# Patient Record
Sex: Female | Born: 1970 | Race: Black or African American | Hispanic: No | Marital: Single | Smoking: Never smoker
Health system: Southern US, Community
[De-identification: ages and names within clinical notes are randomized; demographics above are authoritative.]

## PROBLEM LIST (undated history)

## (undated) DIAGNOSIS — Z923 Personal history of irradiation: Secondary | ICD-10-CM

## (undated) DIAGNOSIS — T7840XA Allergy, unspecified, initial encounter: Secondary | ICD-10-CM

## (undated) DIAGNOSIS — L309 Dermatitis, unspecified: Secondary | ICD-10-CM

## (undated) DIAGNOSIS — Z973 Presence of spectacles and contact lenses: Secondary | ICD-10-CM

## (undated) DIAGNOSIS — K420 Umbilical hernia with obstruction, without gangrene: Secondary | ICD-10-CM

## (undated) DIAGNOSIS — H409 Unspecified glaucoma: Secondary | ICD-10-CM

## (undated) DIAGNOSIS — J309 Allergic rhinitis, unspecified: Secondary | ICD-10-CM

## (undated) DIAGNOSIS — D649 Anemia, unspecified: Secondary | ICD-10-CM

## (undated) DIAGNOSIS — D509 Iron deficiency anemia, unspecified: Secondary | ICD-10-CM

## (undated) DIAGNOSIS — R011 Cardiac murmur, unspecified: Secondary | ICD-10-CM

## (undated) HISTORY — DX: Allergy, unspecified, initial encounter: T78.40XA

## (undated) HISTORY — DX: Iron deficiency anemia, unspecified: D50.9

## (undated) HISTORY — DX: Cardiac murmur, unspecified: R01.1

## (undated) HISTORY — DX: Umbilical hernia with obstruction, without gangrene: K42.0

## (undated) HISTORY — DX: Dermatitis, unspecified: L30.9

## (undated) HISTORY — DX: Allergic rhinitis, unspecified: J30.9

## (undated) HISTORY — DX: Anemia, unspecified: D64.9

## (undated) HISTORY — PX: HERNIA REPAIR: SHX51

## (undated) HISTORY — DX: Presence of spectacles and contact lenses: Z97.3

## (undated) HISTORY — DX: Unspecified glaucoma: H40.9

---

## 1999-07-07 ENCOUNTER — Emergency Department (HOSPITAL_COMMUNITY): Admission: EM | Admit: 1999-07-07 | Discharge: 1999-07-07 | Payer: Self-pay | Admitting: Emergency Medicine

## 1999-12-27 ENCOUNTER — Emergency Department (HOSPITAL_COMMUNITY): Admission: EM | Admit: 1999-12-27 | Discharge: 1999-12-27 | Payer: Self-pay

## 2000-01-07 ENCOUNTER — Emergency Department (HOSPITAL_COMMUNITY): Admission: EM | Admit: 2000-01-07 | Discharge: 2000-01-07 | Payer: Self-pay | Admitting: Emergency Medicine

## 2000-02-16 ENCOUNTER — Other Ambulatory Visit: Admission: RE | Admit: 2000-02-16 | Discharge: 2000-02-16 | Payer: Self-pay | Admitting: Obstetrics & Gynecology

## 2000-08-06 ENCOUNTER — Emergency Department (HOSPITAL_COMMUNITY): Admission: EM | Admit: 2000-08-06 | Discharge: 2000-08-06 | Payer: Self-pay | Admitting: Emergency Medicine

## 2000-09-19 ENCOUNTER — Emergency Department (HOSPITAL_COMMUNITY): Admission: EM | Admit: 2000-09-19 | Discharge: 2000-09-19 | Payer: Self-pay | Admitting: Emergency Medicine

## 2001-08-02 ENCOUNTER — Other Ambulatory Visit: Admission: RE | Admit: 2001-08-02 | Discharge: 2001-08-02 | Payer: Self-pay | Admitting: Obstetrics & Gynecology

## 2001-08-16 ENCOUNTER — Emergency Department (HOSPITAL_COMMUNITY): Admission: EM | Admit: 2001-08-16 | Discharge: 2001-08-16 | Payer: Self-pay | Admitting: *Deleted

## 2003-05-24 ENCOUNTER — Other Ambulatory Visit: Admission: RE | Admit: 2003-05-24 | Discharge: 2003-05-24 | Payer: Self-pay | Admitting: Obstetrics & Gynecology

## 2003-09-03 ENCOUNTER — Emergency Department (HOSPITAL_COMMUNITY): Admission: EM | Admit: 2003-09-03 | Discharge: 2003-09-04 | Payer: Self-pay | Admitting: Emergency Medicine

## 2004-07-13 ENCOUNTER — Other Ambulatory Visit: Admission: RE | Admit: 2004-07-13 | Discharge: 2004-07-13 | Payer: Self-pay | Admitting: Obstetrics & Gynecology

## 2005-08-26 ENCOUNTER — Other Ambulatory Visit: Admission: RE | Admit: 2005-08-26 | Discharge: 2005-08-26 | Payer: Self-pay | Admitting: Obstetrics & Gynecology

## 2005-08-27 ENCOUNTER — Emergency Department (HOSPITAL_COMMUNITY): Admission: EM | Admit: 2005-08-27 | Discharge: 2005-08-27 | Payer: Self-pay | Admitting: Emergency Medicine

## 2009-09-24 LAB — CONVERTED CEMR LAB: Pap Smear: NORMAL

## 2009-11-06 ENCOUNTER — Ambulatory Visit: Payer: Self-pay | Admitting: Internal Medicine

## 2009-11-06 DIAGNOSIS — J45909 Unspecified asthma, uncomplicated: Secondary | ICD-10-CM | POA: Insufficient documentation

## 2009-11-06 DIAGNOSIS — J309 Allergic rhinitis, unspecified: Secondary | ICD-10-CM

## 2009-11-06 HISTORY — DX: Allergic rhinitis, unspecified: J30.9

## 2009-11-07 LAB — CONVERTED CEMR LAB
ALT: 15 units/L (ref 0–35)
AST: 22 units/L (ref 0–37)
Albumin: 3.7 g/dL (ref 3.5–5.2)
Alkaline Phosphatase: 70 units/L (ref 39–117)
BUN: 4 mg/dL — ABNORMAL LOW (ref 6–23)
Basophils Absolute: 0 10*3/uL (ref 0.0–0.1)
Basophils Relative: 1.1 % (ref 0.0–3.0)
Bilirubin Urine: NEGATIVE
Bilirubin, Direct: 0.1 mg/dL (ref 0.0–0.3)
CO2: 25 meq/L (ref 19–32)
Calcium: 8.4 mg/dL (ref 8.4–10.5)
Chloride: 99 meq/L (ref 96–112)
Cholesterol: 205 mg/dL — ABNORMAL HIGH (ref 0–200)
Creatinine, Ser: 0.8 mg/dL (ref 0.4–1.2)
Direct LDL: 141.6 mg/dL
Eosinophils Absolute: 0.1 10*3/uL (ref 0.0–0.7)
Eosinophils Relative: 2.2 % (ref 0.0–5.0)
GFR calc non Af Amer: 103.37 mL/min (ref >60)
Glucose, Bld: 71 mg/dL (ref 70–99)
HCT: 37.4 % (ref 36.0–46.0)
HDL: 52.1 mg/dL (ref >39.00)
Hemoglobin: 11.8 g/dL — ABNORMAL LOW (ref 12.0–15.0)
Ketones, ur: NEGATIVE mg/dL
Leukocytes, UA: NEGATIVE
Lymphocytes Relative: 41.7 % (ref 12.0–46.0)
Lymphs Abs: 1.9 10*3/uL (ref 0.7–4.0)
MCHC: 31.6 g/dL (ref 30.0–36.0)
MCV: 73.6 fL — ABNORMAL LOW (ref 78.0–100.0)
Monocytes Absolute: 0.3 10*3/uL (ref 0.1–1.0)
Monocytes Relative: 6.6 % (ref 3.0–12.0)
Neutro Abs: 2.2 10*3/uL (ref 1.4–7.7)
Neutrophils Relative %: 48.4 % (ref 43.0–77.0)
Nitrite: NEGATIVE
Platelets: 290 10*3/uL (ref 150.0–400.0)
Potassium: 3.8 meq/L (ref 3.5–5.1)
RBC: 5.07 M/uL (ref 3.87–5.11)
RDW: 13.8 % (ref 11.5–14.6)
Sodium: 135 meq/L (ref 135–145)
Specific Gravity, Urine: 1.015 (ref 1.000–1.030)
TSH: 0.8 microintl units/mL (ref 0.35–5.50)
Total Bilirubin: 0.6 mg/dL (ref 0.3–1.2)
Total CHOL/HDL Ratio: 4
Total Protein, Urine: NEGATIVE mg/dL
Total Protein: 7.5 g/dL (ref 6.0–8.3)
Triglycerides: 119 mg/dL (ref 0.0–149.0)
Urine Glucose: NEGATIVE mg/dL
Urobilinogen, UA: 0.2 (ref 0.0–1.0)
VLDL: 23.8 mg/dL (ref 0.0–40.0)
WBC: 4.5 10*3/uL (ref 4.5–10.5)
pH: 6 (ref 5.0–8.0)

## 2009-11-14 ENCOUNTER — Encounter: Admission: RE | Admit: 2009-11-14 | Discharge: 2009-11-14 | Payer: Self-pay | Admitting: Internal Medicine

## 2009-11-14 LAB — HM MAMMOGRAPHY: HM Mammogram: NEGATIVE

## 2009-11-20 ENCOUNTER — Encounter (INDEPENDENT_AMBULATORY_CARE_PROVIDER_SITE_OTHER): Payer: Self-pay | Admitting: *Deleted

## 2009-11-20 ENCOUNTER — Ambulatory Visit: Payer: Self-pay | Admitting: Internal Medicine

## 2009-11-20 DIAGNOSIS — D509 Iron deficiency anemia, unspecified: Secondary | ICD-10-CM | POA: Insufficient documentation

## 2009-11-20 DIAGNOSIS — E785 Hyperlipidemia, unspecified: Secondary | ICD-10-CM | POA: Insufficient documentation

## 2009-11-20 HISTORY — DX: Iron deficiency anemia, unspecified: D50.9

## 2010-02-06 ENCOUNTER — Ambulatory Visit: Payer: Self-pay | Admitting: Internal Medicine

## 2010-02-06 LAB — CONVERTED CEMR LAB
Basophils Absolute: 0.1 10*3/uL (ref 0.0–0.1)
Basophils Relative: 0.8 % (ref 0.0–3.0)
Eosinophils Absolute: 0.2 10*3/uL (ref 0.0–0.7)
Eosinophils Relative: 2.9 % (ref 0.0–5.0)
HCT: 35 % — ABNORMAL LOW (ref 36.0–46.0)
Hemoglobin: 11.6 g/dL — ABNORMAL LOW (ref 12.0–15.0)
Lymphocytes Relative: 44.9 % (ref 12.0–46.0)
Lymphs Abs: 2.9 10*3/uL (ref 0.7–4.0)
MCHC: 33.3 g/dL (ref 30.0–36.0)
MCV: 71.6 fL — ABNORMAL LOW (ref 78.0–100.0)
Monocytes Absolute: 0.4 10*3/uL (ref 0.1–1.0)
Monocytes Relative: 5.9 % (ref 3.0–12.0)
Neutro Abs: 2.9 10*3/uL (ref 1.4–7.7)
Neutrophils Relative %: 45.5 % (ref 43.0–77.0)
Platelets: 299 10*3/uL (ref 150.0–400.0)
RBC: 4.89 M/uL (ref 3.87–5.11)
RDW: 15.3 % — ABNORMAL HIGH (ref 11.5–14.6)
WBC: 6.5 10*3/uL (ref 4.5–10.5)

## 2010-11-24 NOTE — Assessment & Plan Note (Signed)
Summary: NEW [employer] PT-PKG--STC   Vital Signs:  Patient profile:   39 year old female Height:      62 inches Weight:      187 pounds BMI:     34.33 O2 Sat:      98 % on Room air Temp:     98.7 degrees F oral Pulse rate:   70 / minute BP sitting:   104 / 70  (left arm) Cuff size:   regular  Vitals Entered ByZella Ball Ewing (November 06, 2009 9:43 AM)  O2 Flow:  Room air  Preventive Care Screening  Pap Smear:    Date:  09/24/2009    Results:  normal   Last Tetanus Booster:    Date:  10/25/2000    Results:  Historical   CC: New patient/RE   CC:  New patient/RE.  History of Present Illness: overall doing well, no complaints.  Pt denies CP, sob, doe, wheezing, orthopnea, pnd, worsening LE edema, palps, dizziness or syncope  Pt denies new neuro symptoms such as headache, facial or extremity weakness   Preventive Screening-Counseling & Management  Alcohol-Tobacco     Smoking Status: never  Problems Prior to Update: 1)  Preventive Health Care  (ICD-V70.0) 2)  Asthma  (ICD-493.90) 3)  Allergic Rhinitis  (ICD-477.9)  Medications Prior to Update: 1)  None  Current Medications (verified): 1)  Claritin 10 Mg Tabs (Loratadine) .Marland Kitchen.. 1 By Mouth Once Daily 2)  Daily Multi  Tabs (Multiple Vitamins-Minerals) .Marland Kitchen.. 1 By Mouth Once Daily 3)  Necon 10/11 (28) 35 Mcg Tabs (Norethin-Eth Estrad Biphasic) .Marland Kitchen.. 1 By Mouth Once Daily 4)  Allergy Shots Weekly 5)  Advair Diskus 100-50 Mcg/dose Aepb (Fluticasone-Salmeterol) .Marland Kitchen.. 1 By Mouth Two Times A Day As Needed 6)  Proair Hfa 108 (90 Base) Mcg/act Aers (Albuterol Sulfate) .... 2 Puffs Qid As Needed  Allergies (verified): 1)  ! 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 mother with HTN father with HTN and DM  Social History: Reviewed history and no changes required. 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         all otherwise negative per pt  Physical Exam  General:  alert and overweight-appearing.   Head:  normocephalic and atraumatic.   Eyes:  vision grossly intact, pupils equal, and pupils round.   Ears:  R ear normal and L ear normal.   Nose:  no external deformity and no nasal discharge.   Mouth:  no gingival abnormalities and pharynx pink and moist.   Neck:  supple and no masses.   Lungs:  normal respiratory effort and normal breath sounds.   Heart:  normal rate and regular rhythm.   Abdomen:  soft, non-tender, and  normal bowel sounds.   Msk:  no joint tenderness and no joint swelling.   Extremities:  no edema, no erythema  Neurologic:  cranial nerves II-XII intact and strength normal in all extremities.     Impression & Recommendations:  Problem # 1:  Preventive Health Care (ICD-V70.0)  Overall doing well, updated the age appropriate counseling and education;  routine health screening/prevention reviewed and done as appropriate unless declined, immunizations up to date or declined, diet counseling done if overweight, urged to quit  smoking if smokes , labs ordered  Orders: TLB-BMP (Basic Metabolic Panel-BMET) (80048-METABOL) TLB-CBC Platelet - w/Differential (85025-CBCD) TLB-Hepatic/Liver Function Pnl (80076-HEPATIC) TLB-Lipid Panel (80061-LIPID) TLB-TSH (Thyroid Stimulating Hormone) (84443-TSH) TLB-Udip ONLY (81003-UDIP)  Complete Medication List: 1)  Claritin 10 Mg Tabs (Loratadine) .Marland Kitchen.. 1 by mouth once daily 2)  Daily Multi Tabs (Multiple vitamins-minerals) .Marland Kitchen.. 1 by mouth once daily 3)  Necon 10/11 (28) 35 Mcg Tabs (Norethin-eth estrad biphasic) .Marland Kitchen.. 1 by mouth once daily 4)  Allergy Shots Weekly  5)  Advair Diskus 100-50 Mcg/dose Aepb (Fluticasone-salmeterol) .Marland Kitchen.. 1 by mouth two times a day as needed 6)  Proair Hfa 108 (90 Base) Mcg/act Aers (Albuterol sulfate) .... 2 puffs qid as needed  Patient Instructions: 1)  Please call  Imaging on [address]; or call [facility] on [address] for the baseline mammogram 2)  Continue all previous medications as before this visit  3)  Please go to the Lab in the basement for your blood and/or urine tests today 4)  Please schedule a follow-up appointment in 1 year or sooner if needed   Immunization History:  Tetanus/Td Immunization History:    Tetanus/Td:  historical (10/25/2000)

## 2010-11-24 NOTE — Letter (Signed)
Summary: Out of Work  [facility]  [address] [place], [place] [address]   Phone: [phone]  Fax: [phone]    November 20, 2009   Employee:  Sue Beasley    To Whom It May Concern:   For Medical reasons, please excuse the above named employee from work for the following dates:  Start:   11/20/2009  End:   11/21/2009  If you need additional information, please feel free to contact our office.         Sincerely,    Dr. Oliver Barre

## 2010-11-24 NOTE — Assessment & Plan Note (Signed)
Summary: FEVER 101 /NWS SIDE DOOR   Vital Signs:  Patient profile:   40 year old female Height:      62 inches Weight:      183 pounds BMI:     33.59 O2 Sat:      98 % on Room air Temp:     102.4 degrees F oral Pulse rate:   93 / minute BP sitting:   110 / 70  (left arm) Cuff size:   regular  Vitals Entered ByZella Ball Ewing (November 20, 2009 2:02 PM)  O2 Flow:  Room air CC: fever,nasal congestion,cough,chills/RE   CC:  fever, nasal congestion, cough, and chills/RE.  History of Present Illness: here with acute onset rather high fever, ST, facial congestion , prod cough greenish sputum, but Pt denies CP, sob, doe, wheezing, orthopnea, pnd, worsening LE edema, palps, dizziness or syncope , or chills.  Pt denies new neuro symptoms such as headache, facial or extremity weakness   No obvoius overt bleeding but continues to have significant heavier periods thought related to fibroids over the past few months, and has plan to f/u with GYN in april.    Problems Prior to Update: 1)  Preventive Health Care  (ICD-V70.0) 2)  Asthma  (ICD-493.90) 3)  Allergic Rhinitis  (ICD-477.9)  Medications Prior to Update: 1)  Claritin 10 Mg Tabs (Loratadine) .Marland Kitchen.. 1 By Mouth Once Daily 2)  Daily Multi  Tabs (Multiple Vitamins-Minerals) .Marland Kitchen.. 1 By Mouth Once Daily 3)  Necon 10/11 (28) 35 Mcg Tabs (Norethin-Eth Estrad Biphasic) .Marland Kitchen.. 1 By Mouth Once Daily 4)  Allergy Shots Weekly 5)  Advair Diskus 100-50 Mcg/dose Aepb (Fluticasone-Salmeterol) .Marland Kitchen.. 1 By Mouth Two Times A Day As Needed 6)  Proair Hfa 108 (90 Base) Mcg/act Aers (Albuterol Sulfate) .... 2 Puffs Qid As Needed  Current Medications (verified): 1)  Claritin 10 Mg Tabs (Loratadine) .Marland Kitchen.. 1 By Mouth Once Daily 2)  Daily Multi  Tabs (Multiple Vitamins-Minerals) .Marland Kitchen.. 1 By Mouth Once Daily 3)  Necon 10/11 (28) 35 Mcg Tabs (Norethin-Eth Estrad Biphasic) .Marland Kitchen.. 1 By Mouth Once Daily 4)  Allergy Shots Weekly 5)  Advair Diskus 100-50 Mcg/dose Aepb  (Fluticasone-Salmeterol) .Marland Kitchen.. 1 By Mouth Two Times A Day As Needed 6)  Proair Hfa 108 (90 Base) Mcg/act Aers (Albuterol Sulfate) .... 2 Puffs Qid As Needed 7)  Ferrous Sulfate 325 (65 Fe) Mg Tabs (Ferrous Sulfate) .Marland Kitchen.. 1 By Mouth Once Daily 8)  Clarithromycin 500 Mg Tabs (Clarithromycin) .Marland Kitchen.. 1 By Mouth Two Times A Day 9)  Hydrocodone-Homatropine 5-1.5 Mg/58ml Syrp (Hydrocodone-Homatropine) .Marland Kitchen.. 1 Tsp By Mouth Q 6 Hrs As Needed Cough  Allergies (verified): 1)  ! * Chicken/egg Allergy 2)  ! 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  , mild ill alert.   Head:  normocephalic and atraumatic.  normocephalic and atraumatic.   Eyes:  vision grossly intact, pupils equal, and pupils round.  vision grossly intact, pupils equal, and pupils round.   Ears:  bilat tm's red, sinus nontender Nose:  nasal dischargemucosal pallor and mucosal erythema.  nasal dischargemucosal pallor and mucosal erythema.  Mouth:  pharyngeal erythema, fair dentition, and pharyngeal exudate.  pharyngeal erythema, fair dentition, and pharyngeal exudate.   Neck:  supple and cervical lymphadenopathy.  supple and cervical lymphadenopathy.   Lungs:  normal respiratory effort and normal breath sounds.  normal respiratory effort and normal breath sounds.   Heart:  normal rate and regular rhythm.  normal rate and regular rhythm.   Extremities:  no edema, no erythema    Impression & Recommendations:  Problem # 1:  BRONCHITIS-ACUTE (ICD-466.0)  Her updated medication list for this problem  includes:    Advair Diskus 100-50 Mcg/dose Aepb (Fluticasone-salmeterol) .Marland Kitchen... 1 by mouth two times a day as needed    Proair Hfa 108 (90 Base) Mcg/act Aers (Albuterol sulfate) .Marland Kitchen... 2 puffs qid as needed    Clarithromycin 500 Mg Tabs (Clarithromycin) .Marland Kitchen... 1 by mouth two times a day    Hydrocodone-homatropine 5-1.5 Mg/40ml Syrp (Hydrocodone-homatropine) .Marland Kitchen... 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 1 by mouth once daily treat as above, f/u any worsening signs or symptoms   Problem # 3:  HYPERLIPIDEMIA (ICD-272.4)  Labs Reviewed: SGOT: 22 (11/06/2009)   SGPT: 15 (11/06/2009)   HDL:52.10 (11/06/2009)  Chol:205 (11/06/2009)  Trig:119.0 (11/06/2009) d/w pt - goal ldl less than 100, declines counseling at this time, to follow lower chol diet at home  Problem # 4:  ASTHMA (ICD-493.90)  Her updated medication list for this problem includes:    Advair Diskus 100-50 Mcg/dose Aepb (Fluticasone-salmeterol) .Marland Kitchen... 1 by mouth two times a day as needed    Proair Hfa 108 (90 Base) Mcg/act Aers (Albuterol sulfate) .Marland Kitchen... 2 puffs qid as needed o/w stable  - cont meds as is  Complete Medication List: 1)  Claritin 10 Mg Tabs (Loratadine) .Marland Kitchen.. 1 by mouth once daily 2)  Daily Multi Tabs (Multiple vitamins-minerals) .Marland Kitchen.. 1 by mouth once daily 3)  Necon 10/11 (28) 35 Mcg Tabs (Norethin-eth estrad biphasic) .Marland Kitchen.. 1 by mouth once daily 4)  Allergy Shots Weekly  5)  Advair Diskus 100-50 Mcg/dose Aepb (Fluticasone-salmeterol) .Marland Kitchen.. 1 by mouth two times a day as needed 6)  Proair Hfa 108 (90 Base) Mcg/act Aers (Albuterol sulfate) .... 2 puffs qid as needed 7)  Ferrous Sulfate 325 (65 Fe) Mg Tabs (Ferrous sulfate) .Marland Kitchen.. 1 by mouth once daily 8)  Clarithromycin 500 Mg Tabs (Clarithromycin) .Marland Kitchen.. 1 by  mouth two times a day 9)  Hydrocodone-homatropine 5-1.5 Mg/84ml Syrp (Hydrocodone-homatropine) .Marland Kitchen.. 1 tsp by mouth q 6 hrs as needed cough  Patient Instructions: 1)  Please take all new medications as prescribed 2)  Continue all previous medications as before this visit  3)  you are given the note off work 4)  please keep your appt with GYN in April to followup on the anemia 5)  please take iron sulfate 325 mg -1 per day 6)  Please schedule a follow-up appointment as needed. Prescriptions: HYDROCODONE-HOMATROPINE 5-1.5 MG/5ML SYRP (HYDROCODONE-HOMATROPINE) 1 tsp by mouth q 6 hrs as needed cough  #6 oz x 1   Entered and Authorized by:   [name] MD   Signed by:   [name] MD on 11/20/2009   Method used:   Print then Give to Patient   RxID:   [id number] CLARITHROMYCIN  500 MG TABS (CLARITHROMYCIN) 1 by mouth two times a day  #20 x 0   Entered and Authorized by:   [name] MD   Signed by:   [name] MD on 11/20/2009   Method used:   Print then Give to Patient   RxID:   [id number]

## 2011-03-01 ENCOUNTER — Ambulatory Visit (INDEPENDENT_AMBULATORY_CARE_PROVIDER_SITE_OTHER): Payer: BC Managed Care – PPO | Admitting: Internal Medicine

## 2011-03-01 ENCOUNTER — Encounter: Payer: Self-pay | Admitting: Internal Medicine

## 2011-03-01 VITALS — BP 102/70 | HR 58 | Temp 98.5°F | Ht 61.0 in | Wt 190.2 lb

## 2011-03-01 DIAGNOSIS — Z0001 Encounter for general adult medical examination with abnormal findings: Secondary | ICD-10-CM | POA: Insufficient documentation

## 2011-03-01 DIAGNOSIS — Z0000 Encounter for general adult medical examination without abnormal findings: Secondary | ICD-10-CM | POA: Insufficient documentation

## 2011-03-01 DIAGNOSIS — K420 Umbilical hernia with obstruction, without gangrene: Secondary | ICD-10-CM

## 2011-03-01 HISTORY — DX: Umbilical hernia with obstruction, without gangrene: K42.0

## 2011-03-01 NOTE — Patient Instructions (Signed)
Continue all other medications as before Please see the [facility] today regarding the surgical referral Please return in 6 mo with Lab testing done 3-5 days before

## 2011-03-01 NOTE — Progress Notes (Signed)
  Subjective:    Patient ID: Sue Beasley, female    DOB: [birth year], 40 y.o.   MRN: [id number]  HPI  Here for new problem, acute onset pain x 4 days, mild to mod at the umbilicus but just not getting better;  Has a swollen area new for her after doing lifting wt excercises;  No rash or drainage, fever, n/v, bowel change or blood.   Denies urinary symptoms such as dysuria, frequency, urgency,or hematuria.  No prior hx of umbilical hernia and has never heard of this.   No past medical history on file. No past surgical history on file.  reports that she has never smoked. She does not have any smokeless tobacco history on file. She reports that she does not drink alcohol. Her drug history not on file. family history is not on file. Allergies  Allergen Reactions  . BJY:NWGNFAOZHYQ+MVHQIONGE+XBMWUXLKGM Acid+Aspartame     REACTION: rash   No current outpatient prescriptions on file prior to visit.   Review of Systems All otherwise neg per pt     Objective:   Physical Exam BP 102/70  Pulse 58  Temp(Src) 98.5 F (36.9 C) (Oral)  Ht 5\' 1"  (1.549 m)  Wt 190 lb 4 oz (86.297 kg)  BMI 35.95 kg/m2  SpO2 99%  LMP 02/12/2011 Physical Exam  VS noted Constitutional: Pt appears well-developed and well-nourished.  HENT: Head: Normocephalic.  Right Ear: External ear normal.  Left Ear: External ear normal.  Eyes: Conjunctivae and EOM are normal. Pupils are equal, round, and reactive to light.  Neck: Normal range of motion. Neck supple.  Cardiovascular: Normal rate and regular rhythm.   Pulmonary/Chest: Effort normal and breath sounds normal.  Abd:  Soft, NT, non-distended, + BS except for small but mod tender non reducible mass at the right umbilicus, prob hernia , non fluctuance, no drainage Neurological: Pt is alert. No cranial nerve deficit.  Skin: Skin is warm. No erythema.  Psychiatric: Pt behavior is normal. Thought content normal.         Assessment & Plan:

## 2011-03-01 NOTE — Assessment & Plan Note (Signed)
Small but at least mod tender, and unable to readily reduce,  Should be seen per surgury soon; will refer

## 2011-03-03 ENCOUNTER — Encounter (INDEPENDENT_AMBULATORY_CARE_PROVIDER_SITE_OTHER): Payer: Self-pay | Admitting: General Surgery

## 2011-03-12 ENCOUNTER — Encounter: Payer: Self-pay | Admitting: Internal Medicine

## 2011-05-07 ENCOUNTER — Other Ambulatory Visit (INDEPENDENT_AMBULATORY_CARE_PROVIDER_SITE_OTHER): Payer: Self-pay | Admitting: General Surgery

## 2011-05-07 ENCOUNTER — Encounter (HOSPITAL_COMMUNITY): Payer: BC Managed Care – PPO

## 2011-05-07 LAB — CBC
HCT: 36.3 % (ref 36.0–46.0)
Hemoglobin: 11.3 g/dL — ABNORMAL LOW (ref 12.0–15.0)
MCH: 20.7 pg — ABNORMAL LOW (ref 26.0–34.0)
MCHC: 31.1 g/dL (ref 30.0–36.0)
MCV: 66.5 fL — ABNORMAL LOW (ref 78.0–100.0)
Platelets: 324 10*3/uL (ref 150–400)
RBC: 5.46 MIL/uL — ABNORMAL HIGH (ref 3.87–5.11)
RDW: 17.3 % — ABNORMAL HIGH (ref 11.5–15.5)
WBC: 5.8 10*3/uL (ref 4.0–10.5)

## 2011-05-07 LAB — URINALYSIS, ROUTINE W REFLEX MICROSCOPIC
Bilirubin Urine: NEGATIVE
Glucose, UA: NEGATIVE mg/dL
Ketones, ur: NEGATIVE mg/dL
Leukocytes, UA: NEGATIVE
Nitrite: NEGATIVE
Protein, ur: NEGATIVE mg/dL
Specific Gravity, Urine: 1.015 (ref 1.005–1.030)
Urobilinogen, UA: 0.2 mg/dL (ref 0.0–1.0)
pH: 5 (ref 5.0–8.0)

## 2011-05-07 LAB — HCG, SERUM, QUALITATIVE: Preg, Serum: NEGATIVE

## 2011-05-07 LAB — URINE MICROSCOPIC-ADD ON

## 2011-05-07 LAB — SURGICAL PCR SCREEN
MRSA, PCR: NEGATIVE
Staphylococcus aureus: NEGATIVE

## 2011-05-10 ENCOUNTER — Telehealth (INDEPENDENT_AMBULATORY_CARE_PROVIDER_SITE_OTHER): Payer: Self-pay | Admitting: General Surgery

## 2011-05-10 NOTE — Telephone Encounter (Signed)
Message copied by Liliana Cline on Mon May 10, 2011  1:58 PM ------      Message from: Andrey Campanile, ERIC M      Created: Mon May 10, 2011 12:29 PM       Labs ok for surgery

## 2011-05-10 NOTE — Telephone Encounter (Signed)
Labs ok for surgery faxed to [facility] per Dr Andrey Campanile. [phone].

## 2011-05-13 ENCOUNTER — Ambulatory Visit (HOSPITAL_COMMUNITY)
Admission: RE | Admit: 2011-05-13 | Discharge: 2011-05-13 | Disposition: A | Discharge status: Home / Self Care | Payer: BC Managed Care – PPO | Source: Ambulatory Visit | Attending: General Surgery | Admitting: General Surgery

## 2011-05-13 DIAGNOSIS — Z01812 Encounter for preprocedural laboratory examination: Secondary | ICD-10-CM | POA: Insufficient documentation

## 2011-05-13 DIAGNOSIS — J45909 Unspecified asthma, uncomplicated: Secondary | ICD-10-CM | POA: Insufficient documentation

## 2011-05-13 DIAGNOSIS — E669 Obesity, unspecified: Secondary | ICD-10-CM | POA: Insufficient documentation

## 2011-05-13 DIAGNOSIS — K429 Umbilical hernia without obstruction or gangrene: Secondary | ICD-10-CM

## 2011-05-13 DIAGNOSIS — Z01810 Encounter for preprocedural cardiovascular examination: Secondary | ICD-10-CM | POA: Insufficient documentation

## 2011-05-13 HISTORY — PX: UMBILICAL HERNIA REPAIR: SHX196

## 2011-05-14 ENCOUNTER — Telehealth (INDEPENDENT_AMBULATORY_CARE_PROVIDER_SITE_OTHER): Payer: Self-pay | Admitting: General Surgery

## 2011-05-14 NOTE — Telephone Encounter (Signed)
Left message for patient to call back and make post op appt

## 2011-05-28 NOTE — Op Note (Signed)
NAME:  Sporn, Aruna                  ACCOUNT NO.:  [id number]  MEDICAL RECORD NO.:  [id number]  LOCATION:  DAYL                         FACILITY:  [facility]  PHYSICIAN:  Mary Sella. Andrey Campanile, MD     DATE OF BIRTH:  13-Jul-1971  DATE OF PROCEDURE:  05/13/2011 DATE OF DISCHARGE:                              OPERATIVE REPORT   PREOPERATIVE DIAGNOSIS:  Umbilical hernia.  POSTOPERATIVE DIAGNOSIS:  Umbilical hernia.  PROCEDURE PERFORMED:  Open primary repair of umbilical hernia.  SURGEON:  Mary Sella. Andrey Campanile, MD.  ANESTHESIA:  General plus 30 mL Exparel.  FINDINGS:  Fascial defect less than 1 cm in size.  ESTIMATED BLOOD LOSS:  Minimal.  INDICATIONS FOR PROCEDURE:  The patient is a 40 year old overweight African-American female who developed umbilical pain in May.  She presented to my office and she was found to have a small umbilical hernia.  We discussed nonoperative and operative management.  The patient elected to have operative repair.  Now, she want to wait until July, she was on school break.  We discussed the risks and benefits of surgery including but limited to bleeding, infection, injury to surround structures, scarring, hematoma formation, DVT recurrence, and hernia recurrence.  We also discussed the typical postop recovery course.  She elected to proceed with the surgery.  DESCRIPTION OF PROCEDURE:  After obtaining informed consent, the patient was brought to the operating room and placed supine on the operating table.  General endotracheal anesthesia was established.  Sequential compression devices were placed.  She received vancomycin prior to skin incision.  Her abdomen was prepped and draped in usual standard surgical fashion with ChloraPrep.  Surgical time-out was performed.  A transverse infraumbilical incision was made of approximately 5 cm. The subcutaneous tissue was divided by electrocautery.  Then, using a kelley, I tunneled up and around the umbilicus, lifted the  umbilicus off the fascia with electrocautery.  Thus exposing the fascial defect.  The fascial defect was around 0.8 cm.  I lifted the adipose tissue up off the fascia   around the defect in a circumferential manner for about 3 cm.  There is little bit of bleeding from the fascial edges.  This was controlled with hemostasis with electrocautery.  I then placed 4 interrupted #1 Novafil sutures to close defect transversely.  The subcutaneous tissue was irrigated.  Hemostasis was achieved.  I then injected some Exparel which had been diluted up to 30 mL in the fascia and the subcutaneous tissue and the deep dermis. The base of the umbilicus was tacked back down to the fascia with 2-0 Vicryl sutures.  The deep dermis was reapproximated with inverted interrupted 3-0 Vicryl and the skin was reapproximated with running 4-0 Monocryl followed by the application and Steri-Strips, 4x4, and an offsite.  The patient was extubated and taken to the recovery room in stable condition.  There were no immediate complications.  The patient tolerated the procedure well.  All needle and instruments counts were correct x3.     Mary Sella. Andrey Campanile, MD     EMW/MEDQ  D:  05/13/2011  T:  05/13/2011  Job:  [id number]  cc:   [name],  MD [address] [place] [address]  Electronically Signed by Gaynelle Adu M.D. on 05/28/2011 01:19:03 PM

## 2011-06-01 ENCOUNTER — Encounter (INDEPENDENT_AMBULATORY_CARE_PROVIDER_SITE_OTHER): Payer: Self-pay | Admitting: General Surgery

## 2011-06-02 ENCOUNTER — Encounter (INDEPENDENT_AMBULATORY_CARE_PROVIDER_SITE_OTHER): Payer: Self-pay | Admitting: General Surgery

## 2011-06-02 ENCOUNTER — Ambulatory Visit (INDEPENDENT_AMBULATORY_CARE_PROVIDER_SITE_OTHER): Payer: BC Managed Care – PPO | Admitting: General Surgery

## 2011-06-02 DIAGNOSIS — Z09 Encounter for follow-up examination after completed treatment for conditions other than malignant neoplasm: Secondary | ICD-10-CM

## 2011-06-02 NOTE — Progress Notes (Signed)
CC: postop  Procedure: Open primary umbilical hernia repair May 13, 2011  History of Present Ilness: 40 year old African-American female comes in today for her first postoperative visit. She states that she did quite well after surgery. She did not take any pain pills after surgery. She took some Advil. She denies any fevers or chills. She denies any nausea or vomiting. She denies any diarrhea or constipation. She denies any redness or drainage from her incision. She has had some itching around her incision. She states that it felt like she had done a bunch of crunches immediately after surgery.  Physical Exam: Well-developed well-nourished African-American female no apparent distress Pulmonary-clear to auscultation Cardiac-regular rate and rhythm Abdomen-soft, nontender, nondistended. Well-healed transverse infraumbilical incision. No cellulitis or induration. No hematoma or seroma. No signs of hernia recurrence. Extremities-no edema    data reviewed: I reviewed my operative note from July 19. She had a very small fascial defect approximately 0.7 cm  Assessment and Plan: Status post open primary repair of umbilical hernia  She appears to be doing quite well. I advised her to not lifting greater than 15 pounds until after August 30. She has already returned to work. We discussed the operative findings.  I will see her in 6 weeks.

## 2011-06-02 NOTE — Patient Instructions (Signed)
Do not lift anything greater than 15 pounds until after 8/30.  You may resume full activities after 8/30.

## 2011-07-16 ENCOUNTER — Ambulatory Visit (INDEPENDENT_AMBULATORY_CARE_PROVIDER_SITE_OTHER): Payer: BC Managed Care – PPO | Admitting: General Surgery

## 2011-07-16 ENCOUNTER — Encounter (INDEPENDENT_AMBULATORY_CARE_PROVIDER_SITE_OTHER): Payer: Self-pay | Admitting: General Surgery

## 2011-07-16 VITALS — BP 122/82 | HR 60 | Temp 98.0°F | Resp 20 | Ht 61.5 in | Wt 196.0 lb

## 2011-07-16 DIAGNOSIS — Z09 Encounter for follow-up examination after completed treatment for conditions other than malignant neoplasm: Secondary | ICD-10-CM

## 2011-07-16 NOTE — Progress Notes (Signed)
CC: postop  Procedure: Open primary umbilical hernia repair May 13, 2011  History of Present Ilness: 40 year old African-American female comes in today for her second postoperative visit. She states that she has been doing well.  She denies any fevers or chills. She denies any nausea or vomiting. She denies any diarrhea or constipation. She denies any redness or drainage from her incision. She has resumed working out with her trainer.  She will occasionally have some mild discomfort around her umbilicus.  Physical Exam: BP 122/82  Pulse 60  Temp(Src) 98 F (36.7 C) (Temporal)  Resp 20  Ht 5' 1.5" (1.562 m)  Wt 196 lb (88.905 kg)  BMI 36.43 kg/m2  Well-developed well-nourished African-American female no apparent distress Pulmonary-clear to auscultation Cardiac-regular rate and rhythm Abdomen-soft, nontender, nondistended. Well-healed transverse infraumbilical incision. No cellulitis or induration. No hematoma or seroma. No signs of hernia recurrence. Extremities-no edema    data reviewed: I reviewed my operative note from July 19. She had a very small fascial defect approximately 0.7 cm  Assessment and Plan: Status post open primary repair of umbilical hernia  She appears to be doing quite well. I explained that the mild intermittent discomfort will continue to improve.  I will see her on a prn basis

## 2011-07-16 NOTE — Patient Instructions (Signed)
Can resume full activities 

## 2011-08-30 ENCOUNTER — Other Ambulatory Visit (INDEPENDENT_AMBULATORY_CARE_PROVIDER_SITE_OTHER): Payer: BC Managed Care – PPO

## 2011-08-30 DIAGNOSIS — Z0000 Encounter for general adult medical examination without abnormal findings: Secondary | ICD-10-CM

## 2011-08-30 LAB — LIPID PANEL
Cholesterol: 189 mg/dL (ref 0–200)
HDL: 51.6 mg/dL (ref >39.00)
LDL Cholesterol: 121 mg/dL — ABNORMAL HIGH (ref 0–99)
Total CHOL/HDL Ratio: 4
Triglycerides: 84 mg/dL (ref 0.0–149.0)
VLDL: 16.8 mg/dL (ref 0.0–40.0)

## 2011-08-30 LAB — URINALYSIS, ROUTINE W REFLEX MICROSCOPIC
Bilirubin Urine: NEGATIVE
Ketones, ur: NEGATIVE
Leukocytes, UA: NEGATIVE
Nitrite: NEGATIVE
Specific Gravity, Urine: 1.025 (ref 1.000–1.030)
Total Protein, Urine: NEGATIVE
Urine Glucose: NEGATIVE
Urobilinogen, UA: 0.2 (ref 0.0–1.0)
pH: 5.5 (ref 5.0–8.0)

## 2011-08-30 LAB — CBC WITH DIFFERENTIAL/PLATELET
Basophils Absolute: 0 10*3/uL (ref 0.0–0.1)
Basophils Relative: 0.9 % (ref 0.0–3.0)
Eosinophils Absolute: 0.2 10*3/uL (ref 0.0–0.7)
Eosinophils Relative: 3.4 % (ref 0.0–5.0)
HCT: 36.9 % (ref 36.0–46.0)
Hemoglobin: 12 g/dL (ref 12.0–15.0)
Lymphocytes Relative: 50.2 % — ABNORMAL HIGH (ref 12.0–46.0)
Lymphs Abs: 2.5 10*3/uL (ref 0.7–4.0)
MCHC: 32.5 g/dL (ref 30.0–36.0)
MCV: 71.4 fl — ABNORMAL LOW (ref 78.0–100.0)
Monocytes Absolute: 0.4 10*3/uL (ref 0.1–1.0)
Monocytes Relative: 7.8 % (ref 3.0–12.0)
Neutro Abs: 1.9 10*3/uL (ref 1.4–7.7)
Neutrophils Relative %: 37.7 % — ABNORMAL LOW (ref 43.0–77.0)
Platelets: 283 10*3/uL (ref 150.0–400.0)
RBC: 5.17 Mil/uL — ABNORMAL HIGH (ref 3.87–5.11)
RDW: 16.5 % — ABNORMAL HIGH (ref 11.5–14.6)
WBC: 5 10*3/uL (ref 4.5–10.5)

## 2011-08-30 LAB — BASIC METABOLIC PANEL
BUN: 8 mg/dL (ref 6–23)
CO2: 27 mEq/L (ref 19–32)
Calcium: 8.7 mg/dL (ref 8.4–10.5)
Chloride: 106 mEq/L (ref 96–112)
Creatinine, Ser: 0.9 mg/dL (ref 0.4–1.2)
GFR: 90.54 mL/min (ref >60.00)
Glucose, Bld: 88 mg/dL (ref 70–99)
Potassium: 4.4 mEq/L (ref 3.5–5.1)
Sodium: 140 mEq/L (ref 135–145)

## 2011-08-30 LAB — HEPATIC FUNCTION PANEL
ALT: 15 U/L (ref 0–35)
AST: 17 U/L (ref 0–37)
Albumin: 3.5 g/dL (ref 3.5–5.2)
Alkaline Phosphatase: 72 U/L (ref 39–117)
Bilirubin, Direct: 0 mg/dL (ref 0.0–0.3)
Total Bilirubin: 0.2 mg/dL — ABNORMAL LOW (ref 0.3–1.2)
Total Protein: 7.2 g/dL (ref 6.0–8.3)

## 2011-08-30 LAB — TSH: TSH: 1.06 u[IU]/mL (ref 0.35–5.50)

## 2011-09-02 ENCOUNTER — Ambulatory Visit (INDEPENDENT_AMBULATORY_CARE_PROVIDER_SITE_OTHER): Payer: BC Managed Care – PPO | Admitting: Internal Medicine

## 2011-09-02 ENCOUNTER — Encounter: Payer: Self-pay | Admitting: Internal Medicine

## 2011-09-02 VITALS — BP 110/68 | HR 71 | Temp 98.6°F | Ht 61.0 in | Wt 197.0 lb

## 2011-09-02 DIAGNOSIS — Z23 Encounter for immunization: Secondary | ICD-10-CM

## 2011-09-02 DIAGNOSIS — Z0000 Encounter for general adult medical examination without abnormal findings: Secondary | ICD-10-CM

## 2011-09-02 NOTE — Patient Instructions (Addendum)
You had the tetanus shot today Continue all other medications as before Please remember to followup with your GYN for the yearly pap smear and/or mammogram Please remember to get regular exercise, follow lower cholesterol diet, and weight control Please return in 1 year for your yearly visit, or sooner if needed, with Lab testing done 3-5 days before

## 2011-09-04 ENCOUNTER — Encounter: Payer: Self-pay | Admitting: Internal Medicine

## 2011-09-04 NOTE — Progress Notes (Signed)
Subjective:    Patient ID: Sue Beasley, female    DOB: Feb 07, 1971, 40 y.o.   MRN: [id number]  HPI  Here for wellness and f/u;  Overall doing ok;  Pt denies CP, worsening SOB, DOE, wheezing, orthopnea, PND, worsening LE edema, palpitations, dizziness or syncope.  Pt denies neurological change such as new Headache, facial or extremity weakness.  Pt denies polydipsia, polyuria, or low sugar symptoms. Pt states overall good compliance with treatment and medications, good tolerability, and trying to follow lower cholesterol diet.  Pt denies worsening depressive symptoms, suicidal ideation or panic. No fever, wt loss, night sweats, loss of appetite, or other constitutional symptoms.  Pt states good ability with ADL's, low fall risk, home safety reviewed and adequate, no significant changes in hearing or vision, and occasionally active with exercise.   Current Outpatient Prescriptions on File Prior to Visit  Medication Sig Dispense Refill  . albuterol (PROAIR HFA) 108 (90 BASE) MCG/ACT inhaler Inhale 2 puffs into the lungs 4 (four) times daily.        Marland Kitchen desoximetasone (TOPICORT) 0.25 % cream Apply topically 2 (two) times daily.        . ferrous sulfate 325 (65 FE) MG tablet Take 325 mg by mouth daily.        . Fluticasone-Salmeterol (ADVAIR DISKUS) 100-50 MCG/DOSE AEPB Inhale 1 puff into the lungs every 12 (twelve) hours.        Marland Kitchen loratadine (CLARITIN) 10 MG tablet Take 10 mg by mouth daily.        . Multiple Vitamin (MULTIVITAMIN) capsule Take 1 capsule by mouth daily.        . Multiple Vitamins-Minerals (MULTIPLE VITAMINS/WOMENS PO) Take by mouth.        Loyola Mast Biphasic (NECON 10/11, 28,) 0.5-35/1-35 MG-MCG TABS Take by mouth daily.         Past Medical History  Diagnosis Date  . Anemia   . Asthma   . Eczema   . Wears glasses   . ANEMIA-IRON DEFICIENCY 11/20/2009  . ALLERGIC RHINITIS 11/06/2009  . Irreducible umbilical hernia 03/01/2011   Past Surgical History  Procedure Date  .  Umbilical hernia repair 05/13/11    reports that she has never smoked. She does not have any smokeless tobacco history on file. She reports that she does not drink alcohol or use illicit drugs. family history includes Diabetes in her father; Hyperlipidemia in her mother; and Hypertension in her father. Allergies  Allergen Reactions  . WUJ:[id number]+AOZHYQMVH+QIONGEXBMW Acid+Aspartame     REACTION: rash  . Peanut-Containing Drug Products   . Shellfish-Derived Products    Review of Systems Review of Systems  Constitutional: Negative for diaphoresis, activity change, appetite change and unexpected weight change.  HENT: Negative for hearing loss, ear pain, facial swelling, mouth sores and neck stiffness.   Eyes: Negative for pain, redness and visual disturbance.  Respiratory: Negative for shortness of breath and wheezing.   Cardiovascular: Negative for chest pain and palpitations.  Gastrointestinal: Negative for diarrhea, blood in stool, abdominal distention and rectal pain.  Genitourinary: Negative for hematuria, flank pain and decreased urine volume.  Musculoskeletal: Negative for myalgias and joint swelling.  Skin: Negative for color change and wound.  Neurological: Negative for syncope and numbness.  Hematological: Negative for adenopathy.  Psychiatric/Behavioral: Negative for hallucinations, self-injury, decreased concentration and agitation.      Objective:   Physical Exam BP 110/68  Pulse 71  Temp(Src) 98.6 F (37 C) (Oral)  Ht 5\' 1"  (1.549  m)  Wt 197 lb (89.359 kg)  BMI 37.22 kg/m2  SpO2 98%  LMP 08/26/2011 Physical Exam  VS noted Constitutional: Pt is oriented to person, place, and time. Appears well-developed and well-nourished.  HENT:  Head: Normocephalic and atraumatic.  Right Ear: External ear normal.  Left Ear: External ear normal.  Nose: Nose normal.  Mouth/Throat: Oropharynx is clear and moist.  Eyes: Conjunctivae and EOM are normal. Pupils are equal, round,  and reactive to light.  Neck: Normal range of motion. Neck supple. No JVD present. No tracheal deviation present.  Cardiovascular: Normal rate, regular rhythm, normal heart sounds and intact distal pulses.   Pulmonary/Chest: Effort normal and breath sounds normal.  Abdominal: Soft. Bowel sounds are normal. There is no tenderness.  Musculoskeletal: Normal range of motion. Exhibits no edema.  Lymphadenopathy:  Has no cervical adenopathy.  Neurological: Pt is alert and oriented to person, place, and time. Pt has normal reflexes. No cranial nerve deficit.  Skin: Skin is warm and dry. No rash noted.  Psychiatric:  Has  normal mood and affect. Behavior is normal.         Assessment & Plan:

## 2011-09-04 NOTE — Assessment & Plan Note (Signed)

## 2012-02-21 ENCOUNTER — Other Ambulatory Visit: Payer: Self-pay | Admitting: Internal Medicine

## 2012-02-21 DIAGNOSIS — Z1231 Encounter for screening mammogram for malignant neoplasm of breast: Secondary | ICD-10-CM

## 2012-03-13 ENCOUNTER — Ambulatory Visit
Admission: RE | Admit: 2012-03-13 | Discharge: 2012-03-13 | Disposition: A | Discharge status: Home / Self Care | Payer: BC Managed Care – PPO | Source: Ambulatory Visit | Attending: Internal Medicine | Admitting: Internal Medicine

## 2012-03-13 DIAGNOSIS — Z1231 Encounter for screening mammogram for malignant neoplasm of breast: Secondary | ICD-10-CM

## 2012-09-05 ENCOUNTER — Other Ambulatory Visit (INDEPENDENT_AMBULATORY_CARE_PROVIDER_SITE_OTHER): Payer: BC Managed Care – PPO

## 2012-09-05 DIAGNOSIS — Z0000 Encounter for general adult medical examination without abnormal findings: Secondary | ICD-10-CM

## 2012-09-05 LAB — BASIC METABOLIC PANEL
BUN: 8 mg/dL (ref 6–23)
CO2: 24 mEq/L (ref 19–32)
Calcium: 8.4 mg/dL (ref 8.4–10.5)
Chloride: 102 mEq/L (ref 96–112)
Creatinine, Ser: 0.9 mg/dL (ref 0.4–1.2)
GFR: 91.25 mL/min (ref >60.00)
Glucose, Bld: 91 mg/dL (ref 70–99)
Potassium: 3.9 mEq/L (ref 3.5–5.1)
Sodium: 134 mEq/L — ABNORMAL LOW (ref 135–145)

## 2012-09-05 LAB — URINALYSIS, ROUTINE W REFLEX MICROSCOPIC
Bilirubin Urine: NEGATIVE
Hgb urine dipstick: NEGATIVE
Ketones, ur: NEGATIVE
Leukocytes, UA: NEGATIVE
Nitrite: NEGATIVE
Specific Gravity, Urine: 1.02 (ref 1.000–1.030)
Total Protein, Urine: NEGATIVE
Urine Glucose: NEGATIVE
Urobilinogen, UA: 0.2 (ref 0.0–1.0)
pH: 6 (ref 5.0–8.0)

## 2012-09-05 LAB — CBC WITH DIFFERENTIAL/PLATELET
Basophils Absolute: 0 10*3/uL (ref 0.0–0.1)
Basophils Relative: 0.4 % (ref 0.0–3.0)
Eosinophils Absolute: 0.1 10*3/uL (ref 0.0–0.7)
Eosinophils Relative: 1.5 % (ref 0.0–5.0)
HCT: 33.2 % — ABNORMAL LOW (ref 36.0–46.0)
Hemoglobin: 10.4 g/dL — ABNORMAL LOW (ref 12.0–15.0)
Lymphocytes Relative: 30.5 % (ref 12.0–46.0)
Lymphs Abs: 1.7 10*3/uL (ref 0.7–4.0)
MCHC: 31.5 g/dL (ref 30.0–36.0)
MCV: 67 fl — ABNORMAL LOW (ref 78.0–100.0)
Monocytes Absolute: 0.5 10*3/uL (ref 0.1–1.0)
Monocytes Relative: 8.7 % (ref 3.0–12.0)
Neutro Abs: 3.2 10*3/uL (ref 1.4–7.7)
Neutrophils Relative %: 58.9 % (ref 43.0–77.0)
Platelets: 336 10*3/uL (ref 150.0–400.0)
RBC: 4.96 Mil/uL (ref 3.87–5.11)
RDW: 16.4 % — ABNORMAL HIGH (ref 11.5–14.6)
WBC: 5.5 10*3/uL (ref 4.5–10.5)

## 2012-09-05 LAB — LIPID PANEL
Cholesterol: 177 mg/dL (ref 0–200)
HDL: 50.5 mg/dL (ref >39.00)
LDL Cholesterol: 105 mg/dL — ABNORMAL HIGH (ref 0–99)
Total CHOL/HDL Ratio: 4
Triglycerides: 110 mg/dL (ref 0.0–149.0)
VLDL: 22 mg/dL (ref 0.0–40.0)

## 2012-09-05 LAB — HEPATIC FUNCTION PANEL
ALT: 20 U/L (ref 0–35)
AST: 30 U/L (ref 0–37)
Albumin: 3.5 g/dL (ref 3.5–5.2)
Alkaline Phosphatase: 85 U/L (ref 39–117)
Bilirubin, Direct: 0.1 mg/dL (ref 0.0–0.3)
Total Bilirubin: 0.3 mg/dL (ref 0.3–1.2)
Total Protein: 7.2 g/dL (ref 6.0–8.3)

## 2012-09-05 LAB — TSH: TSH: 0.83 u[IU]/mL (ref 0.35–5.50)

## 2012-09-05 LAB — IBC PANEL
Iron: 40 ug/dL — ABNORMAL LOW (ref 42–145)
Saturation Ratios: 6.9 % — ABNORMAL LOW (ref 20.0–50.0)
Transferrin: 416.2 mg/dL — ABNORMAL HIGH (ref 212.0–360.0)

## 2012-09-06 LAB — VITAMIN B12: Vitamin B-12: 369 pg/mL (ref 211–911)

## 2012-09-07 ENCOUNTER — Encounter: Payer: Self-pay | Admitting: Internal Medicine

## 2012-09-07 ENCOUNTER — Ambulatory Visit (INDEPENDENT_AMBULATORY_CARE_PROVIDER_SITE_OTHER): Payer: BC Managed Care – PPO | Admitting: Internal Medicine

## 2012-09-07 VITALS — BP 120/80 | HR 88 | Temp 97.4°F | Ht 61.0 in | Wt 198.0 lb

## 2012-09-07 DIAGNOSIS — D509 Iron deficiency anemia, unspecified: Secondary | ICD-10-CM

## 2012-09-07 DIAGNOSIS — Z0000 Encounter for general adult medical examination without abnormal findings: Secondary | ICD-10-CM

## 2012-09-07 NOTE — Assessment & Plan Note (Signed)

## 2012-09-07 NOTE — Progress Notes (Signed)
Subjective:    Patient ID: Sue Beasley, female    DOB: [birth year], 41 y.o.   MRN: [id number]    HPI  Here for wellness and f/u;  Overall doing ok;  Pt denies CP, worsening SOB, DOE, wheezing, orthopnea, PND, worsening LE edema, palpitations, dizziness or syncope.  Pt denies neurological change such as new Headache, facial or extremity weakness.  Pt denies polydipsia, polyuria, or low sugar symptoms. Pt states overall good compliance with treatment and medications, good tolerability, and trying to follow lower cholesterol diet.  Pt denies worsening depressive symptoms, suicidal ideation or panic. No fever, wt loss, night sweats, loss of appetite, or other constitutional symptoms.  Pt states good ability with ADL's, low fall risk, home safety reviewed and adequate, no significant changes in hearing or vision, and occasionally active with exercise.  No acute complaints Past Medical History  Diagnosis Date  . Anemia   . Asthma   . Eczema   . Wears glasses   . ANEMIA-IRON DEFICIENCY 11/20/2009  . ALLERGIC RHINITIS 11/06/2009  . Irreducible umbilical hernia 03/01/2011   Past Surgical History  Procedure Date  . Umbilical hernia repair 05/13/11    reports that she has never smoked. She does not have any smokeless tobacco history on file. She reports that she does not drink alcohol or use illicit drugs. family history includes Diabetes in her father; Hyperlipidemia in her mother; and Hypertension in her father. Allergies  Allergen Reactions  . Amoxicillin-Pot Clavulanate     REACTION: rash  . Peanut-Containing Drug Products   . Shellfish-Derived Products    Current Outpatient Prescriptions on File Prior to Visit  Medication Sig Dispense Refill  . albuterol (PROAIR HFA) 108 (90 BASE) MCG/ACT inhaler Inhale 2 puffs into the lungs 4 (four) times daily.        Marland Kitchen desoximetasone (TOPICORT) 0.25 % cream Apply topically 2 (two) times daily.        . ferrous sulfate 325 (65 FE) MG tablet Take 325 mg by  mouth daily.        . Fluticasone-Salmeterol (ADVAIR DISKUS) 100-50 MCG/DOSE AEPB Inhale 1 puff into the lungs every 12 (twelve) hours.        Marland Kitchen loratadine (CLARITIN) 10 MG tablet Take 10 mg by mouth daily.        . Multiple Vitamin (MULTIVITAMIN) capsule Take 1 capsule by mouth daily.        . Multiple Vitamins-Minerals (MULTIPLE VITAMINS/WOMENS PO) Take by mouth.        Loyola Mast Biphasic (NECON 10/11, 28,) 0.5-35/1-35 MG-MCG TABS Take by mouth daily.         Review of Systems Review of Systems  Constitutional: Negative for diaphoresis, activity change, appetite change and unexpected weight change.  HENT: Negative for hearing loss, ear pain, facial swelling, mouth sores and neck stiffness.   Eyes: Negative for pain, redness and visual disturbance.  Respiratory: Negative for shortness of breath and wheezing.   Cardiovascular: Negative for chest pain and palpitations.  Gastrointestinal: Negative for diarrhea, blood in stool, abdominal distention and rectal pain.  Genitourinary: Negative for hematuria, flank pain and decreased urine volume.  Musculoskeletal: Negative for myalgias and joint swelling.  Skin: Negative for color change and wound.  Neurological: Negative for syncope and numbness.  Hematological: Negative for adenopathy.  Psychiatric/Behavioral: Negative for hallucinations, self-injury, decreased concentration and agitation.      Objective:   Physical Exam BP 120/80  Pulse 88  Temp 97.4 F (36.3 C) (Oral)  Ht 5\' 1"  (1.549 m)  Wt 198 lb (89.812 kg)  BMI 37.41 kg/m2  SpO2 99% Physical Exam  VS noted Constitutional: Pt is oriented to person, place, and time. Appears well-developed and well-nourished.  HENT:  Head: Normocephalic and atraumatic.  Right Ear: External ear normal.  Left Ear: External ear normal.  Nose: Nose normal.  Mouth/Throat: Oropharynx is clear and moist.  Eyes: Conjunctivae and EOM are normal. Pupils are equal, round, and reactive to  light.  Neck: Normal range of motion. Neck supple. No JVD present. No tracheal deviation present.  Cardiovascular: Normal rate, regular rhythm, normal heart sounds and intact distal pulses.   Pulmonary/Chest: Effort normal and breath sounds normal.  Abdominal: Soft. Bowel sounds are normal. There is no tenderness.  Musculoskeletal: Normal range of motion. Exhibits no edema.  Lymphadenopathy:  Has no cervical adenopathy.  Neurological: Pt is alert and oriented to person, place, and time. Pt has normal reflexes. No cranial nerve deficit.  Skin: Skin is warm and dry. No rash noted.  Psychiatric:  Has  normal mood and affect. Behavior is normal.     Assessment & Plan:

## 2012-09-07 NOTE — Patient Instructions (Addendum)
Continue all other medications as before, including re-starting the iron once per day Please have the pharmacy call with any other refills you may need. Please continue your efforts at being more active, low cholesterol diet, and weight control. Thank you for enrolling in MyChart. Please follow the instructions below to securely access your online medical record. MyChart allows you to send messages to your doctor, view your test results, renew your prescriptions, schedule appointments, and more. Your username is [email or web address] Please remember to followup with your GYN for the yearly pap smear and/or mammogram Please return in 1 year for your yearly visit, or sooner if needed, with Lab testing done 3-5 days before

## 2012-09-07 NOTE — Assessment & Plan Note (Signed)
Mild worsening, has not taken her daily iron in the past 2-3 mo, to re-start daily iron

## 2012-12-09 ENCOUNTER — Other Ambulatory Visit: Payer: Self-pay

## 2013-02-26 ENCOUNTER — Other Ambulatory Visit: Payer: Self-pay

## 2013-02-26 DIAGNOSIS — Z1231 Encounter for screening mammogram for malignant neoplasm of breast: Secondary | ICD-10-CM

## 2013-04-03 ENCOUNTER — Ambulatory Visit
Admission: RE | Admit: 2013-04-03 | Discharge: 2013-04-03 | Disposition: A | Discharge status: Home / Self Care | Payer: BC Managed Care – PPO | Source: Ambulatory Visit

## 2013-04-03 DIAGNOSIS — Z1231 Encounter for screening mammogram for malignant neoplasm of breast: Secondary | ICD-10-CM

## 2013-04-04 ENCOUNTER — Other Ambulatory Visit: Payer: Self-pay | Admitting: Internal Medicine

## 2013-04-04 DIAGNOSIS — R928 Other abnormal and inconclusive findings on diagnostic imaging of breast: Secondary | ICD-10-CM

## 2013-04-18 ENCOUNTER — Ambulatory Visit
Admission: RE | Admit: 2013-04-18 | Discharge: 2013-04-18 | Disposition: A | Discharge status: Home / Self Care | Payer: BC Managed Care – PPO | Source: Ambulatory Visit | Attending: Internal Medicine | Admitting: Internal Medicine

## 2013-04-18 DIAGNOSIS — R928 Other abnormal and inconclusive findings on diagnostic imaging of breast: Secondary | ICD-10-CM

## 2013-04-18 LAB — HM MAMMOGRAPHY

## 2013-08-30 ENCOUNTER — Other Ambulatory Visit: Payer: Self-pay

## 2013-09-06 ENCOUNTER — Other Ambulatory Visit (INDEPENDENT_AMBULATORY_CARE_PROVIDER_SITE_OTHER): Payer: BC Managed Care – PPO

## 2013-09-06 DIAGNOSIS — Z0000 Encounter for general adult medical examination without abnormal findings: Secondary | ICD-10-CM

## 2013-09-06 LAB — BASIC METABOLIC PANEL
BUN: 8 mg/dL (ref 6–23)
CO2: 22 mEq/L (ref 19–32)
Calcium: 8.5 mg/dL (ref 8.4–10.5)
Chloride: 106 mEq/L (ref 96–112)
Creatinine, Ser: 0.8 mg/dL (ref 0.4–1.2)
GFR: 98.51 mL/min (ref >60.00)
Glucose, Bld: 99 mg/dL (ref 70–99)
Potassium: 3.8 mEq/L (ref 3.5–5.1)
Sodium: 136 mEq/L (ref 135–145)

## 2013-09-06 LAB — URINALYSIS, ROUTINE W REFLEX MICROSCOPIC
Bilirubin Urine: NEGATIVE
Ketones, ur: NEGATIVE
Leukocytes, UA: NEGATIVE
Nitrite: NEGATIVE
Specific Gravity, Urine: 1.025 (ref 1.000–1.030)
Total Protein, Urine: NEGATIVE
Urine Glucose: NEGATIVE
Urobilinogen, UA: 0.2 (ref 0.0–1.0)
pH: 6 (ref 5.0–8.0)

## 2013-09-06 LAB — CBC WITH DIFFERENTIAL/PLATELET
Basophils Absolute: 0 10*3/uL (ref 0.0–0.1)
Basophils Relative: 0.7 % (ref 0.0–3.0)
Eosinophils Absolute: 0.2 10*3/uL (ref 0.0–0.7)
Eosinophils Relative: 2.9 % (ref 0.0–5.0)
HCT: 33 % — ABNORMAL LOW (ref 36.0–46.0)
Hemoglobin: 10.7 g/dL — ABNORMAL LOW (ref 12.0–15.0)
Lymphocytes Relative: 43.7 % (ref 12.0–46.0)
Lymphs Abs: 2.8 10*3/uL (ref 0.7–4.0)
MCHC: 32.5 g/dL (ref 30.0–36.0)
MCV: 67.1 fl — ABNORMAL LOW (ref 78.0–100.0)
Monocytes Absolute: 0.4 10*3/uL (ref 0.1–1.0)
Monocytes Relative: 6 % (ref 3.0–12.0)
Neutro Abs: 3 10*3/uL (ref 1.4–7.7)
Neutrophils Relative %: 46.7 % (ref 43.0–77.0)
Platelets: 327 10*3/uL (ref 150.0–400.0)
RBC: 4.92 Mil/uL (ref 3.87–5.11)
RDW: 17.5 % — ABNORMAL HIGH (ref 11.5–14.6)
WBC: 6.4 10*3/uL (ref 4.5–10.5)

## 2013-09-06 LAB — LIPID PANEL
Cholesterol: 186 mg/dL (ref 0–200)
HDL: 42.3 mg/dL (ref >39.00)
LDL Cholesterol: 128 mg/dL — ABNORMAL HIGH (ref 0–99)
Total CHOL/HDL Ratio: 4
Triglycerides: 77 mg/dL (ref 0.0–149.0)
VLDL: 15.4 mg/dL (ref 0.0–40.0)

## 2013-09-06 LAB — TSH: TSH: 2.14 u[IU]/mL (ref 0.35–5.50)

## 2013-09-06 LAB — HEPATIC FUNCTION PANEL
ALT: 12 U/L (ref 0–35)
AST: 15 U/L (ref 0–37)
Albumin: 3.3 g/dL — ABNORMAL LOW (ref 3.5–5.2)
Alkaline Phosphatase: 86 U/L (ref 39–117)
Bilirubin, Direct: 0 mg/dL (ref 0.0–0.3)
Total Bilirubin: 0.5 mg/dL (ref 0.3–1.2)
Total Protein: 7.1 g/dL (ref 6.0–8.3)

## 2013-09-13 ENCOUNTER — Encounter: Payer: Self-pay | Admitting: Internal Medicine

## 2013-09-13 ENCOUNTER — Ambulatory Visit (INDEPENDENT_AMBULATORY_CARE_PROVIDER_SITE_OTHER): Payer: BC Managed Care – PPO | Admitting: Internal Medicine

## 2013-09-13 VITALS — BP 122/80 | HR 92 | Temp 98.5°F | Ht 61.0 in | Wt 201.0 lb

## 2013-09-13 DIAGNOSIS — D509 Iron deficiency anemia, unspecified: Secondary | ICD-10-CM

## 2013-09-13 DIAGNOSIS — Z0000 Encounter for general adult medical examination without abnormal findings: Secondary | ICD-10-CM

## 2013-09-13 DIAGNOSIS — E785 Hyperlipidemia, unspecified: Secondary | ICD-10-CM

## 2013-09-13 NOTE — Progress Notes (Signed)
Pre-visit discussion using our clinic review tool. No additional management support is needed unless otherwise documented below in the visit note.  

## 2013-09-13 NOTE — Assessment & Plan Note (Signed)
D/w pt, to take iron bid for one mo, then once per mo, f/u GYN for menhorrhagia as planned

## 2013-09-13 NOTE — Assessment & Plan Note (Signed)
Declines statin, to follow lower chol diet

## 2013-09-13 NOTE — Patient Instructions (Addendum)
Please take the OTC iron twice per day for one month, then once per day after that Please keep your appointments with your specialists as you have planned - GYN at least yearly, or sooner if you notice more bleeding with the menses, or worsening fatigue, shortness of breath, dizziness Please continue all other medications as before, and refills have been done if requested. Please have the pharmacy call with any other refills you may need. Please continue your efforts at being more active, low cholesterol diet, and weight control. You are otherwise up to date with prevention measures today  Please remember to sign up for My Chart if you have not done so, as this will be important to you in the future with finding out test results, communicating by private email, and scheduling acute appointments online when needed.  Please return in 6 months for a CBC only, to monitor if getting better or worse  Please return in 1 year for your yearly visit, or sooner if needed, with Lab testing done 3-5 days before

## 2013-09-13 NOTE — Addendum Note (Signed)
Addended by: [name] on: 09/13/2013 01:44 PM   Modules accepted: Orders

## 2013-09-13 NOTE — Assessment & Plan Note (Signed)

## 2013-09-13 NOTE — Progress Notes (Signed)
Subjective:    Patient ID: Sue Beasley, female    DOB: 1971-06-27, 42 y.o.   MRN: [id number]  HPI  Here for wellness and f/u;  Overall doing ok;  Pt denies CP, worsening SOB, DOE, wheezing, orthopnea, PND, worsening LE edema, palpitations, dizziness or syncope.  Pt denies neurological change such as new headache, facial or extremity weakness.  Pt denies polydipsia, polyuria, or low sugar symptoms. Pt states overall good compliance with treatment and medications, good tolerability, and has been trying to follow lower cholesterol diet.  Pt denies worsening depressive symptoms, suicidal ideation or panic. No fever, night sweats, wt loss, loss of appetite, or other constitutional symptoms.  Pt states good ability with ADL's, has low fall risk, home safety reviewed and adequate, no other significant changes in hearing or vision, and only occasionally active with exercise. Still having menhorrhgia about same volume, sees GYN once per yr, not taking oral iron recently. Past Medical History  Diagnosis Date  . Anemia   . Asthma   . Eczema   . Wears glasses   . ANEMIA-IRON DEFICIENCY 11/20/2009  . ALLERGIC RHINITIS 11/06/2009  . Irreducible umbilical hernia 03/01/2011   Past Surgical History  Procedure Laterality Date  . Umbilical hernia repair  05/13/11    reports that she has never smoked. She does not have any smokeless tobacco history on file. She reports that she does not drink alcohol or use illicit drugs. family history includes Diabetes in her father; Hyperlipidemia in her mother; Hypertension in her father. Allergies  Allergen Reactions  . Amoxicillin-Pot Clavulanate     REACTION: rash  . Peanut-Containing Drug Products   . Shellfish-Derived Products    Current Outpatient Prescriptions on File Prior to Visit  Medication Sig Dispense Refill  . albuterol (PROAIR HFA) 108 (90 BASE) MCG/ACT inhaler Inhale 2 puffs into the lungs 4 (four) times daily.        Marland Kitchen desoximetasone (TOPICORT) 0.25 %  cream Apply topically 2 (two) times daily.        . ferrous sulfate 325 (65 FE) MG tablet Take 325 mg by mouth daily.        . Fluticasone-Salmeterol (ADVAIR DISKUS) 100-50 MCG/DOSE AEPB Inhale 1 puff into the lungs every 12 (twelve) hours.        Marland Kitchen loratadine (CLARITIN) 10 MG tablet Take 10 mg by mouth daily.        . Multiple Vitamin (MULTIVITAMIN) capsule Take 1 capsule by mouth daily.        Loyola Mast Biphasic (NECON 10/11, 28,) 0.5-35/1-35 MG-MCG TABS Take by mouth daily.         No current facility-administered medications on file prior to visit.     Review of Systems Constitutional: Negative for diaphoresis, activity change, appetite change or unexpected weight change.  HENT: Negative for hearing loss, ear pain, facial swelling, mouth sores and neck stiffness.   Eyes: Negative for pain, redness and visual disturbance.  Respiratory: Negative for shortness of breath and wheezing.   Cardiovascular: Negative for chest pain and palpitations.  Gastrointestinal: Negative for diarrhea, blood in stool, abdominal distention or other pain Genitourinary: Negative for hematuria, flank pain or change in urine volume.  Musculoskeletal: Negative for myalgias and joint swelling.  Skin: Negative for color change and wound.  Neurological: Negative for syncope and numbness. other than noted Hematological: Negative for adenopathy.  Psychiatric/Behavioral: Negative for hallucinations, self-injury, decreased concentration and agitation.      Objective:   Physical Exam BP 122/80  Pulse 92  Temp(Src) 98.5 F (36.9 C) (Oral)  Ht 5\' 1"  (1.549 m)  Wt 201 lb (91.173 kg)  BMI 38.00 kg/m2  SpO2 97% VS noted,  Constitutional: Pt is oriented to person, place, and time. Appears well-developed and well-nourished.  Head: Normocephalic and atraumatic.  Right Ear: External ear normal.  Left Ear: External ear normal.  Nose: Nose normal.  Mouth/Throat: Oropharynx is clear and moist.  Eyes:  Conjunctivae and EOM are normal. Pupils are equal, round, and reactive to light.  Neck: Normal range of motion. Neck supple. No JVD present. No tracheal deviation present.  Cardiovascular: Normal rate, regular rhythm, normal heart sounds and intact distal pulses.   Pulmonary/Chest: Effort normal and breath sounds normal.  Abdominal: Soft. Bowel sounds are normal. There is no tenderness. No HSM but has palpable uterus Musculoskeletal: Normal range of motion. Exhibits no edema.  Lymphadenopathy:  Has no cervical adenopathy.  Neurological: Pt is alert and oriented to person, place, and time. Pt has normal reflexes. No cranial nerve deficit.  Skin: Skin is warm and dry. No rash noted.  Psychiatric:  Has  normal mood and affect. Behavior is normal.     Assessment & Plan:

## 2014-05-28 ENCOUNTER — Telehealth: Payer: Self-pay | Admitting: Internal Medicine

## 2014-05-28 DIAGNOSIS — D6489 Other specified anemias: Secondary | ICD-10-CM

## 2014-05-28 NOTE — Telephone Encounter (Signed)
Cbc ordered

## 2014-05-28 NOTE — Telephone Encounter (Signed)
Pt called request lab to be put in again, she forgot to do it before 03/2014. Please call pt if this okey.

## 2014-05-29 NOTE — Telephone Encounter (Signed)
Patient informed to return to the lab. 

## 2014-06-06 ENCOUNTER — Other Ambulatory Visit (INDEPENDENT_AMBULATORY_CARE_PROVIDER_SITE_OTHER): Payer: BC Managed Care – PPO

## 2014-06-06 DIAGNOSIS — D6489 Other specified anemias: Secondary | ICD-10-CM

## 2014-06-06 LAB — CBC WITH DIFFERENTIAL/PLATELET
Basophils Absolute: 0 10*3/uL (ref 0.0–0.1)
Basophils Relative: 0.8 % (ref 0.0–3.0)
Eosinophils Absolute: 0.2 10*3/uL (ref 0.0–0.7)
Eosinophils Relative: 3 % (ref 0.0–5.0)
HCT: 35.8 % — ABNORMAL LOW (ref 36.0–46.0)
Hemoglobin: 11.5 g/dL — ABNORMAL LOW (ref 12.0–15.0)
Lymphocytes Relative: 47.8 % — ABNORMAL HIGH (ref 12.0–46.0)
Lymphs Abs: 2.7 10*3/uL (ref 0.7–4.0)
MCHC: 32.2 g/dL (ref 30.0–36.0)
MCV: 70.2 fl — ABNORMAL LOW (ref 78.0–100.0)
Monocytes Absolute: 0.5 10*3/uL (ref 0.1–1.0)
Monocytes Relative: 8.8 % (ref 3.0–12.0)
Neutro Abs: 2.3 10*3/uL (ref 1.4–7.7)
Neutrophils Relative %: 39.6 % — ABNORMAL LOW (ref 43.0–77.0)
Platelets: 312 10*3/uL (ref 150.0–400.0)
RBC: 5.09 Mil/uL (ref 3.87–5.11)
RDW: 15 % (ref 11.5–15.5)
WBC: 5.7 10*3/uL (ref 4.0–10.5)

## 2014-06-27 ENCOUNTER — Other Ambulatory Visit: Payer: Self-pay

## 2014-06-27 DIAGNOSIS — Z1231 Encounter for screening mammogram for malignant neoplasm of breast: Secondary | ICD-10-CM

## 2014-07-09 ENCOUNTER — Ambulatory Visit
Admission: RE | Admit: 2014-07-09 | Discharge: 2014-07-09 | Disposition: A | Discharge status: Home / Self Care | Payer: BC Managed Care – PPO | Source: Ambulatory Visit

## 2014-07-09 DIAGNOSIS — Z1231 Encounter for screening mammogram for malignant neoplasm of breast: Secondary | ICD-10-CM

## 2014-07-09 LAB — HM MAMMOGRAPHY

## 2014-08-01 ENCOUNTER — Ambulatory Visit (INDEPENDENT_AMBULATORY_CARE_PROVIDER_SITE_OTHER): Payer: BC Managed Care – PPO | Admitting: Internal Medicine

## 2014-08-01 ENCOUNTER — Encounter: Payer: Self-pay | Admitting: Internal Medicine

## 2014-08-01 VITALS — BP 120/82 | HR 61 | Temp 98.3°F | Ht 61.0 in | Wt 206.2 lb

## 2014-08-01 DIAGNOSIS — R011 Cardiac murmur, unspecified: Secondary | ICD-10-CM | POA: Insufficient documentation

## 2014-08-01 NOTE — Assessment & Plan Note (Addendum)
RUSB > LUSB , ? Flow vs aortic or other -pt asymptomatic, for TTE echo, ECG reviewed as per emr, o/w  to f/u any worsening symptoms or concerns

## 2014-08-01 NOTE — Progress Notes (Signed)
Subjective:    Patient ID: Sue Beasley, female    DOB: 1971/01/05, 43 y.o.   MRN: [id number]  HPI  Here to f/u per allergist  - pt with ? New heart murmur per allergist 2 days ago on exam.  No prior hx of same.  Pt denies chest pain, increased sob or doe, wheezing, orthopnea, PND, increased LE swelling, palpitations, dizziness or syncope.  Has had palp with allegra D before, better with stopping, did see cardiologist once several yrs ago and reassured did not seem to have other signficant heart problem.  Had nasal spray switched per allergist, has not yet started.  Pt denies fever, wt loss, night sweats, loss of appetite, or other constitutional symptoms  Pt denies new neurological symptoms such as new headache, or facial or extremity weakness or numbness   Pt denies polydipsia, polyuria, hard to lose wt.  Past Medical History  Diagnosis Date  . Anemia   . Asthma   . Eczema   . Wears glasses   . ANEMIA-IRON DEFICIENCY 11/20/2009  . ALLERGIC RHINITIS 11/06/2009  . Irreducible umbilical hernia 03/02/5637   Past Surgical History  Procedure Laterality Date  . Umbilical hernia repair  05/13/11    reports that she has never smoked. She does not have any smokeless tobacco history on file. She reports that she does not drink alcohol or use illicit drugs. family history includes Diabetes in her father; Hyperlipidemia in her mother; Hypertension in her father. Allergies  Allergen Reactions  . Amoxicillin-Pot Clavulanate     REACTION: rash  . Influenza Vaccines   . Peanut-Containing Drug Products   . Shellfish-Derived Products    Current Outpatient Prescriptions on File Prior to Visit  Medication Sig Dispense Refill  . albuterol (PROAIR HFA) 108 (90 BASE) MCG/ACT inhaler Inhale 2 puffs into the lungs 4 (four) times daily.        Marland Kitchen desoximetasone (TOPICORT) 0.25 % cream Apply topically 2 (two) times daily.        . ferrous sulfate 325 (65 FE) MG tablet Take 325 mg by mouth daily.        .  Fluticasone-Salmeterol (ADVAIR DISKUS) 100-50 MCG/DOSE AEPB Inhale 1 puff into the lungs every 12 (twelve) hours.        Marland Kitchen loratadine (CLARITIN) 10 MG tablet Take 10 mg by mouth daily.        . Multiple Vitamin (MULTIVITAMIN) capsule Take 1 capsule by mouth daily.        Theola Sequin Biphasic (NECON 10/11, 28,) 0.5-35/1-35 MG-MCG TABS Take by mouth daily.         No current facility-administered medications on file prior to visit.    Review of Systems  Constitutional: Negative for unusual diaphoresis or other sweats  HENT: Negative for ringing in ear Eyes: Negative for double vision or worsening visual disturbance.  Respiratory: Negative for choking and stridor.   Gastrointestinal: Negative for vomiting or other signifcant bowel change Genitourinary: Negative for hematuria or decreased urine volume.  Musculoskeletal: Negative for other MSK pain or swelling Skin: Negative for color change and worsening wound.  Neurological: Negative for tremors and numbness other than noted  Psychiatric/Behavioral: Negative for decreased concentration or agitation other than above       Objective:   Physical Exam BP 120/82  Pulse 61  Temp(Src) 98.3 F (36.8 C) (Oral)  Ht 5\' 1"  (1.549 m)  Wt 206 lb 4 oz (93.554 kg)  BMI 38.99 kg/m2  SpO2 98% VS noted,  Constitutional: Pt appears well-developed, well-nourished.  HENT: Head: NCAT.  Right Ear: External ear normal.  Left Ear: External ear normal.  Eyes: . Pupils are equal, round, and reactive to light. Conjunctivae and EOM are normal Neck: Normal range of motion. Neck supple.  Cardiovascular: Normal rate and regular rhythm.  with gr 2/6 syst murmur RUSB/LUSB without radiation Pulmonary/Chest: Effort normal and breath sounds normal.  - no rales or wheezing Neurological: Pt is alert. Not confused , motor grossly intact Skin: Skin is warm. No rash Psychiatric: Pt behavior is normal. No agitation.     Assessment & Plan:

## 2014-08-01 NOTE — Progress Notes (Signed)
Pre visit review using our clinic review tool, if applicable. No additional management support is needed unless otherwise documented below in the visit note. 

## 2014-08-01 NOTE — Patient Instructions (Signed)
Your EKG was OK today  You will be contacted regarding the referral for: echocardiogram  Please continue all other medications as before, and refills have been done if requested.  Please have the pharmacy call with any other refills you may need.  Please keep your appointments with your specialists as you may have planned

## 2014-08-02 DIAGNOSIS — E669 Obesity, unspecified: Secondary | ICD-10-CM | POA: Insufficient documentation

## 2014-08-16 ENCOUNTER — Other Ambulatory Visit (HOSPITAL_COMMUNITY): Payer: Self-pay | Admitting: Internal Medicine

## 2014-08-16 DIAGNOSIS — R011 Cardiac murmur, unspecified: Secondary | ICD-10-CM

## 2014-08-19 ENCOUNTER — Ambulatory Visit (HOSPITAL_COMMUNITY): Payer: BC Managed Care – PPO | Attending: Cardiology

## 2014-08-19 DIAGNOSIS — L309 Dermatitis, unspecified: Secondary | ICD-10-CM | POA: Diagnosis not present

## 2014-08-19 DIAGNOSIS — E669 Obesity, unspecified: Secondary | ICD-10-CM | POA: Insufficient documentation

## 2014-08-19 DIAGNOSIS — J45909 Unspecified asthma, uncomplicated: Secondary | ICD-10-CM | POA: Insufficient documentation

## 2014-08-19 DIAGNOSIS — R011 Cardiac murmur, unspecified: Secondary | ICD-10-CM

## 2014-08-19 DIAGNOSIS — K429 Umbilical hernia without obstruction or gangrene: Secondary | ICD-10-CM | POA: Diagnosis not present

## 2014-08-19 DIAGNOSIS — D649 Anemia, unspecified: Secondary | ICD-10-CM | POA: Insufficient documentation

## 2014-08-19 NOTE — Progress Notes (Signed)
2D Echo completed. 08/19/2014

## 2014-08-20 ENCOUNTER — Encounter: Payer: Self-pay | Admitting: Internal Medicine

## 2014-09-25 ENCOUNTER — Other Ambulatory Visit (INDEPENDENT_AMBULATORY_CARE_PROVIDER_SITE_OTHER): Payer: BC Managed Care – PPO

## 2014-09-25 DIAGNOSIS — Z0000 Encounter for general adult medical examination without abnormal findings: Secondary | ICD-10-CM

## 2014-09-25 LAB — BASIC METABOLIC PANEL
BUN: 11 mg/dL (ref 6–23)
CO2: 23 mEq/L (ref 19–32)
Calcium: 8.3 mg/dL — ABNORMAL LOW (ref 8.4–10.5)
Chloride: 105 mEq/L (ref 96–112)
Creatinine, Ser: 0.9 mg/dL (ref 0.4–1.2)
GFR: 94.03 mL/min (ref >60.00)
Glucose, Bld: 95 mg/dL (ref 70–99)
Potassium: 4.1 mEq/L (ref 3.5–5.1)
Sodium: 136 mEq/L (ref 135–145)

## 2014-09-25 LAB — CBC WITH DIFFERENTIAL/PLATELET
Basophils Absolute: 0.1 10*3/uL (ref 0.0–0.1)
Basophils Relative: 1.3 % (ref 0.0–3.0)
Eosinophils Absolute: 0.2 10*3/uL (ref 0.0–0.7)
Eosinophils Relative: 3.2 % (ref 0.0–5.0)
HCT: 33.9 % — ABNORMAL LOW (ref 36.0–46.0)
Hemoglobin: 10.6 g/dL — ABNORMAL LOW (ref 12.0–15.0)
Lymphocytes Relative: 44.1 % (ref 12.0–46.0)
Lymphs Abs: 2.9 10*3/uL (ref 0.7–4.0)
MCHC: 31.3 g/dL (ref 30.0–36.0)
MCV: 70.1 fl — ABNORMAL LOW (ref 78.0–100.0)
Monocytes Absolute: 0.6 10*3/uL (ref 0.1–1.0)
Monocytes Relative: 8.8 % (ref 3.0–12.0)
Neutro Abs: 2.8 10*3/uL (ref 1.4–7.7)
Neutrophils Relative %: 42.6 % — ABNORMAL LOW (ref 43.0–77.0)
Platelets: 346 10*3/uL (ref 150.0–400.0)
RBC: 4.83 Mil/uL (ref 3.87–5.11)
RDW: 15.9 % — ABNORMAL HIGH (ref 11.5–15.5)
WBC: 6.5 10*3/uL (ref 4.0–10.5)

## 2014-09-25 LAB — URINALYSIS, ROUTINE W REFLEX MICROSCOPIC
Bilirubin Urine: NEGATIVE
Ketones, ur: NEGATIVE
Leukocytes, UA: NEGATIVE
Nitrite: NEGATIVE
Specific Gravity, Urine: 1.015 (ref 1.000–1.030)
Total Protein, Urine: NEGATIVE
Urine Glucose: NEGATIVE
Urobilinogen, UA: 0.2 (ref 0.0–1.0)
pH: 6 (ref 5.0–8.0)

## 2014-09-25 LAB — LIPID PANEL
Cholesterol: 191 mg/dL (ref 0–200)
HDL: 43 mg/dL (ref >39.00)
LDL Cholesterol: 126 mg/dL — ABNORMAL HIGH (ref 0–99)
NonHDL: 148
Total CHOL/HDL Ratio: 4
Triglycerides: 109 mg/dL (ref 0.0–149.0)
VLDL: 21.8 mg/dL (ref 0.0–40.0)

## 2014-09-25 LAB — HEPATIC FUNCTION PANEL
ALT: 11 U/L (ref 0–35)
AST: 19 U/L (ref 0–37)
Albumin: 3.5 g/dL (ref 3.5–5.2)
Alkaline Phosphatase: 89 U/L (ref 39–117)
Bilirubin, Direct: 0 mg/dL (ref 0.0–0.3)
Total Bilirubin: 0.3 mg/dL (ref 0.2–1.2)
Total Protein: 6.9 g/dL (ref 6.0–8.3)

## 2014-09-25 LAB — TSH: TSH: 2.12 u[IU]/mL (ref 0.35–4.50)

## 2014-09-26 ENCOUNTER — Other Ambulatory Visit (INDEPENDENT_AMBULATORY_CARE_PROVIDER_SITE_OTHER): Payer: BC Managed Care – PPO

## 2014-09-26 ENCOUNTER — Ambulatory Visit (INDEPENDENT_AMBULATORY_CARE_PROVIDER_SITE_OTHER): Payer: BC Managed Care – PPO | Admitting: Internal Medicine

## 2014-09-26 ENCOUNTER — Encounter: Payer: Self-pay | Admitting: Internal Medicine

## 2014-09-26 VITALS — BP 120/82 | HR 60 | Temp 98.0°F | Ht 61.0 in | Wt 208.5 lb

## 2014-09-26 DIAGNOSIS — D509 Iron deficiency anemia, unspecified: Secondary | ICD-10-CM | POA: Diagnosis not present

## 2014-09-26 DIAGNOSIS — E785 Hyperlipidemia, unspecified: Secondary | ICD-10-CM

## 2014-09-26 DIAGNOSIS — Z0000 Encounter for general adult medical examination without abnormal findings: Secondary | ICD-10-CM

## 2014-09-26 LAB — IBC PANEL
Iron: 33 ug/dL — ABNORMAL LOW (ref 42–145)
Saturation Ratios: 6.2 % — ABNORMAL LOW (ref 20.0–50.0)
Transferrin: 383.1 mg/dL — ABNORMAL HIGH (ref 212.0–360.0)

## 2014-09-26 LAB — IRON: Iron: 33 ug/dL — ABNORMAL LOW (ref 42–145)

## 2014-09-26 NOTE — Assessment & Plan Note (Signed)

## 2014-09-26 NOTE — Progress Notes (Signed)
Subjective:    Patient ID: Sue Beasley, female    DOB: 08-23-1971, 43 y.o.   MRN: [id number]  HPI  Here for wellness and f/u;  Overall doing ok;  Pt denies CP, worsening SOB, DOE, wheezing, orthopnea, PND, worsening LE edema, palpitations, dizziness or syncope.  Pt denies neurological change such as new headache, facial or extremity weakness.  Pt denies polydipsia, polyuria, or low sugar symptoms. Pt states overall good compliance with treatment and medications, good tolerability, and has been trying to follow lower cholesterol diet.  Pt denies worsening depressive symptoms, suicidal ideation or panic. No fever, night sweats, wt loss, loss of appetite, or other constitutional symptoms.  Pt states good ability with ADL's, has low fall risk, home safety reviewed and adequate, no other significant changes in hearing or vision, and only occasionally active with exercise. Currently a tenureduniversity math professor.  Declines flu shot. Still  With farily heavy regular menses, has known fibroids, and pap smear for feb 2016. Gained 7 lbs with dietary indiscretion, seeing grief counseling - coiusin died last yr. (sickle cell episode) Past Medical History  Diagnosis Date  . Anemia   . Asthma   . Eczema   . Wears glasses   . ANEMIA-IRON DEFICIENCY 11/20/2009  . ALLERGIC RHINITIS 11/06/2009  . Irreducible umbilical hernia 12/27/2874   Past Surgical History  Procedure Laterality Date  . Umbilical hernia repair  05/13/11    reports that she has never smoked. She does not have any smokeless tobacco history on file. She reports that she does not drink alcohol or use illicit drugs. family history includes Diabetes in her father; Hyperlipidemia in her mother; Hypertension in her father. Allergies  Allergen Reactions  . Amoxicillin-Pot Clavulanate     REACTION: rash  . Influenza Vaccines   . Peanut-Containing Drug Products   . Shellfish-Derived Products    Current Outpatient Prescriptions on File Prior to  Visit  Medication Sig Dispense Refill  . albuterol (PROAIR HFA) 108 (90 BASE) MCG/ACT inhaler Inhale 2 puffs into the lungs 4 (four) times daily.      Marland Kitchen desoximetasone (TOPICORT) 0.25 % cream Apply topically 2 (two) times daily.      . ferrous sulfate 325 (65 FE) MG tablet Take 325 mg by mouth daily.      . Fluticasone-Salmeterol (ADVAIR DISKUS) 100-50 MCG/DOSE AEPB Inhale 1 puff into the lungs every 12 (twelve) hours.      Marland Kitchen loratadine (CLARITIN) 10 MG tablet Take 10 mg by mouth daily.      . Multiple Vitamin (MULTIVITAMIN) capsule Take 1 capsule by mouth daily.      Theola Sequin Biphasic (NECON 10/11, 28,) 0.5-35/1-35 MG-MCG TABS Take by mouth daily.       No current facility-administered medications on file prior to visit.    Review of Systems Constitutional: Negative for increased diaphoresis, other activity, appetite or other siginficant weight change  HENT: Negative for worsening hearing loss, ear pain, facial swelling, mouth sores and neck stiffness.   Eyes: Negative for other worsening pain, redness or visual disturbance.  Respiratory: Negative for shortness of breath and wheezing.   Cardiovascular: Negative for chest pain and palpitations.  Gastrointestinal: Negative for diarrhea, blood in stool, abdominal distention or other pain Genitourinary: Negative for hematuria, flank pain or change in urine volume.  Musculoskeletal: Negative for myalgias or other joint complaints.  Skin: Negative for color change and wound.  Neurological: Negative for syncope and numbness. other than noted Hematological: Negative for adenopathy.  or other swelling Psychiatric/Behavioral: Negative for hallucinations, self-injury, decreased concentration or other worsening agitation.      Objective:   Physical Exam BP 120/82 mmHg  Pulse 60  Temp(Src) 98 F (36.7 C) (Oral)  Ht 5\' 1"  (1.549 m)  Wt 208 lb 8 oz (94.575 kg)  BMI 39.42 kg/m2  SpO2 99% VS noted,  Constitutional: Pt is oriented  to person, place, and time. Appears well-developed and well-nourished.  Head: Normocephalic and atraumatic.  Right Ear: External ear normal.  Left Ear: External ear normal.  Nose: Nose normal.  Mouth/Throat: Oropharynx is clear and moist.  Eyes: Conjunctivae and EOM are normal. Pupils are equal, round, and reactive to light.  Neck: Normal range of motion. Neck supple. No JVD present. No tracheal deviation present.  Cardiovascular: Normal rate, regular rhythm, normal heart sounds and intact distal pulses.   Pulmonary/Chest: Effort normal and breath sounds without rales or wheezing  Abdominal: Soft. Bowel sounds are normal. NT. No HSM  Musculoskeletal: Normal range of motion. Exhibits no edema.  Lymphadenopathy:  Has no cervical adenopathy.  Neurological: Pt is alert and oriented to person, place, and time. Pt has normal reflexes. No cranial nerve deficit. Motor grossly intact Skin: Skin is warm and dry. No rash noted.  Psychiatric:  Has normal mood and affect. Behavior is normal.  Wt Readings from Last 3 Encounters:  09/26/14 208 lb 8 oz (94.575 kg)  08/01/14 206 lb 4 oz (93.554 kg)  09/13/13 201 lb (91.173 kg)       Assessment & Plan:

## 2014-09-26 NOTE — Patient Instructions (Signed)
Please continue all other medications as before, including the iron pills (at least twice per day would be good)   Please have the pharmacy call with any other refills you may need.  Please continue your efforts at being more active, low cholesterol diet, and weight control.  You are otherwise up to date with prevention measures today.  Please keep your appointments with your specialists as you may have planned - GYN for Feb 2016  No further lab work needed today  We will fax your results to your GYN  Please return in 1 year for your yearly visit, or sooner if needed, with Lab testing done 3-5 days before

## 2014-09-26 NOTE — Progress Notes (Signed)
Pre visit review using our clinic review tool, if applicable. No additional management support is needed unless otherwise documented below in the visit note. 

## 2014-09-26 NOTE — Assessment & Plan Note (Signed)
To cont oral iron, f/u GYN for persistent excessive menses/fibroid in feb 2016

## 2014-09-26 NOTE — Assessment & Plan Note (Signed)
Mild, for lower choll diet,  to f/u any worsening symptoms or concerns

## 2015-05-12 ENCOUNTER — Other Ambulatory Visit: Payer: Self-pay

## 2015-05-12 DIAGNOSIS — Z1231 Encounter for screening mammogram for malignant neoplasm of breast: Secondary | ICD-10-CM

## 2015-07-17 ENCOUNTER — Ambulatory Visit
Admission: RE | Admit: 2015-07-17 | Discharge: 2015-07-17 | Disposition: A | Discharge status: Home / Self Care | Payer: BC Managed Care – PPO | Source: Ambulatory Visit

## 2015-07-17 DIAGNOSIS — Z1231 Encounter for screening mammogram for malignant neoplasm of breast: Secondary | ICD-10-CM

## 2015-09-26 ENCOUNTER — Other Ambulatory Visit (INDEPENDENT_AMBULATORY_CARE_PROVIDER_SITE_OTHER): Payer: BC Managed Care – PPO

## 2015-09-26 DIAGNOSIS — D509 Iron deficiency anemia, unspecified: Secondary | ICD-10-CM | POA: Diagnosis not present

## 2015-09-26 DIAGNOSIS — Z0000 Encounter for general adult medical examination without abnormal findings: Secondary | ICD-10-CM

## 2015-09-26 LAB — IBC PANEL
Iron: 28 ug/dL — ABNORMAL LOW (ref 42–145)
Saturation Ratios: 5 % — ABNORMAL LOW (ref 20.0–50.0)
Transferrin: 404 mg/dL — ABNORMAL HIGH (ref 212.0–360.0)

## 2015-09-26 LAB — HEPATIC FUNCTION PANEL
ALT: 9 U/L (ref 0–35)
AST: 12 U/L (ref 0–37)
Albumin: 3.6 g/dL (ref 3.5–5.2)
Alkaline Phosphatase: 94 U/L (ref 39–117)
Bilirubin, Direct: 0.1 mg/dL (ref 0.0–0.3)
Total Bilirubin: 0.2 mg/dL (ref 0.2–1.2)
Total Protein: 7.4 g/dL (ref 6.0–8.3)

## 2015-09-26 LAB — URINALYSIS, ROUTINE W REFLEX MICROSCOPIC
Bilirubin Urine: NEGATIVE
Ketones, ur: NEGATIVE
Leukocytes, UA: NEGATIVE
Nitrite: NEGATIVE
Specific Gravity, Urine: 1.02 (ref 1.000–1.030)
Total Protein, Urine: NEGATIVE
Urine Glucose: NEGATIVE
Urobilinogen, UA: 0.2 (ref 0.0–1.0)
pH: 6 (ref 5.0–8.0)

## 2015-09-26 LAB — CBC WITH DIFFERENTIAL/PLATELET
Basophils Absolute: 0.1 10*3/uL (ref 0.0–0.1)
Basophils Relative: 1 % (ref 0.0–3.0)
Eosinophils Absolute: 0.2 10*3/uL (ref 0.0–0.7)
Eosinophils Relative: 2.9 % (ref 0.0–5.0)
HCT: 34.3 % — ABNORMAL LOW (ref 36.0–46.0)
Hemoglobin: 10.8 g/dL — ABNORMAL LOW (ref 12.0–15.0)
Lymphocytes Relative: 46.9 % — ABNORMAL HIGH (ref 12.0–46.0)
Lymphs Abs: 2.9 10*3/uL (ref 0.7–4.0)
MCHC: 31.3 g/dL (ref 30.0–36.0)
MCV: 63.7 fl — ABNORMAL LOW (ref 78.0–100.0)
Monocytes Absolute: 0.5 10*3/uL (ref 0.1–1.0)
Monocytes Relative: 7.4 % (ref 3.0–12.0)
Neutro Abs: 2.6 10*3/uL (ref 1.4–7.7)
Neutrophils Relative %: 41.8 % — ABNORMAL LOW (ref 43.0–77.0)
Platelets: 394 10*3/uL (ref 150.0–400.0)
RBC: 5.39 Mil/uL — ABNORMAL HIGH (ref 3.87–5.11)
RDW: 19.4 % — ABNORMAL HIGH (ref 11.5–15.5)
WBC: 6.1 10*3/uL (ref 4.0–10.5)

## 2015-09-26 LAB — LIPID PANEL
Cholesterol: 158 mg/dL (ref 0–200)
HDL: 45.5 mg/dL (ref >39.00)
LDL Cholesterol: 91 mg/dL (ref 0–99)
NonHDL: 112.9
Total CHOL/HDL Ratio: 3
Triglycerides: 109 mg/dL (ref 0.0–149.0)
VLDL: 21.8 mg/dL (ref 0.0–40.0)

## 2015-09-26 LAB — BASIC METABOLIC PANEL
BUN: 10 mg/dL (ref 6–23)
CO2: 26 mEq/L (ref 19–32)
Calcium: 8.7 mg/dL (ref 8.4–10.5)
Chloride: 105 mEq/L (ref 96–112)
Creatinine, Ser: 0.87 mg/dL (ref 0.40–1.20)
GFR: 91.11 mL/min (ref >60.00)
Glucose, Bld: 96 mg/dL (ref 70–99)
Potassium: 4.3 mEq/L (ref 3.5–5.1)
Sodium: 138 mEq/L (ref 135–145)

## 2015-09-26 LAB — TSH: TSH: 1.9 u[IU]/mL (ref 0.35–4.50)

## 2015-09-30 ENCOUNTER — Encounter: Payer: Self-pay | Admitting: Internal Medicine

## 2015-09-30 ENCOUNTER — Ambulatory Visit (INDEPENDENT_AMBULATORY_CARE_PROVIDER_SITE_OTHER): Payer: BC Managed Care – PPO | Admitting: Internal Medicine

## 2015-09-30 VITALS — BP 118/70 | HR 76 | Temp 98.7°F | Ht 61.0 in | Wt 211.2 lb

## 2015-09-30 DIAGNOSIS — D509 Iron deficiency anemia, unspecified: Secondary | ICD-10-CM | POA: Diagnosis not present

## 2015-09-30 DIAGNOSIS — Z0000 Encounter for general adult medical examination without abnormal findings: Secondary | ICD-10-CM

## 2015-09-30 NOTE — Patient Instructions (Signed)
Please continue all other medications as before, including the iron twice per day  Please have the pharmacy call with any other refills you may need.  Please continue your efforts at being more active, low cholesterol diet, and weight control.  You are otherwise up to date with prevention measures today.  Please keep your appointments with your specialists as you may have planned  Please return if you change your mind about the flu shot  Please go to the LAB in the Basement (turn left off the elevator) for the tests to be done in 6 months to make sure the anemia is improved  You will be contacted by phone if any changes need to be made immediately.  Otherwise, you will receive a letter about your results with an explanation, but please check with MyChart first.  Please remember to sign up for MyChart if you have not done so, as this will be important to you in the future with finding out test results, communicating by private email, and scheduling acute appointments online when needed.  Please return in 1 year for your yearly visit, or sooner if needed, with Lab testing done 3-5 days before

## 2015-09-30 NOTE — Assessment & Plan Note (Signed)
Urged iron compliance, to re-check iron/cbc in 6 mo, and one yr, sooner if needed

## 2015-09-30 NOTE — Progress Notes (Signed)
Pre visit review using our clinic review tool, if applicable. No additional management support is needed unless otherwise documented below in the visit note. 

## 2015-09-30 NOTE — Progress Notes (Signed)
Subjective:    Patient ID: Sue Beasley, female    DOB: 02-Aug-1971, 44 y.o.   MRN: [id number]  HPI  Here for wellness and f/u;  Overall doing ok;  Pt denies Chest pain, worsening SOB, DOE, wheezing, orthopnea, PND, worsening LE edema, palpitations, dizziness or syncope.  Pt denies neurological change such as new headache, facial or extremity weakness.  Pt denies polydipsia, polyuria, or low sugar symptoms. Pt states overall good compliance with treatment and medications, good tolerability, and has been trying to follow appropriate diet.  Pt denies worsening depressive symptoms, suicidal ideation or panic. No fever, night sweats, wt loss, loss of appetite, or other constitutional symptoms.  Pt states good ability with ADL's, has low fall risk, home safety reviewed and adequate, no other significant changes in hearing or vision, and only occasionally active with exercise. Declines flu shot. Has some trace urinary incontinence with exercise at the gym lately.  Has known large uterine fibroid and menorrhagia, has not required surgury so far, has chronic mild iron def anemia, has not been taking her iron. Past Medical History  Diagnosis Date  . Anemia   . Asthma   . Eczema   . Wears glasses   . ANEMIA-IRON DEFICIENCY 11/20/2009  . ALLERGIC RHINITIS 11/06/2009  . Irreducible umbilical hernia 0000000   Past Surgical History  Procedure Laterality Date  . Umbilical hernia repair  05/13/11    reports that she has never smoked. She does not have any smokeless tobacco history on file. She reports that she does not drink alcohol or use illicit drugs. family history includes Diabetes in her father; Hyperlipidemia in her mother; Hypertension in her father. Allergies  Allergen Reactions  . Amoxicillin-Pot Clavulanate     REACTION: rash  . Influenza Vaccines   . Peanut-Containing Drug Products   . Shellfish-Derived Products    Current Outpatient Prescriptions on File Prior to Visit  Medication Sig  Dispense Refill  . albuterol (PROAIR HFA) 108 (90 BASE) MCG/ACT inhaler Inhale 2 puffs into the lungs 4 (four) times daily.      Marland Kitchen desoximetasone (TOPICORT) 0.25 % cream Apply topically 2 (two) times daily.      . ferrous sulfate 325 (65 FE) MG tablet Take 325 mg by mouth daily.      . Fluticasone-Salmeterol (ADVAIR DISKUS) 100-50 MCG/DOSE AEPB Inhale 1 puff into the lungs every 12 (twelve) hours.      Marland Kitchen loratadine (CLARITIN) 10 MG tablet Take 10 mg by mouth daily.      . Multiple Vitamin (MULTIVITAMIN) capsule Take 1 capsule by mouth daily.      Theola Sequin Biphasic (NECON 10/11, 28,) 0.5-35/1-35 MG-MCG TABS Take by mouth daily.       No current facility-administered medications on file prior to visit.    Review of Systems Constitutional: Negative for increased diaphoresis, other activity, appetite or siginficant weight change other than noted HENT: Negative for worsening hearing loss, ear pain, facial swelling, mouth sores and neck stiffness.   Eyes: Negative for other worsening pain, redness or visual disturbance.  Respiratory: Negative for shortness of breath and wheezing  Cardiovascular: Negative for chest pain and palpitations.  Gastrointestinal: Negative for diarrhea, blood in stool, abdominal distention or other pain Genitourinary: Negative for hematuria, flank pain or change in urine volume.  Musculoskeletal: Negative for myalgias or other joint complaints.  Skin: Negative for color change and wound or drainage.  Neurological: Negative for syncope and numbness. other than noted Hematological: Negative for adenopathy.  or other swelling Psychiatric/Behavioral: Negative for hallucinations, SI, self-injury, decreased concentration or other worsening agitation.      Objective:   Physical Exam BP 118/70 mmHg  Pulse 76  Temp(Src) 98.7 F (37.1 C) (Oral)  Ht 5\' 1"  (1.549 m)  Wt 211 lb 4 oz (95.822 kg)  BMI 39.94 kg/m2  SpO2 98% VS noted,  Constitutional: Pt is  oriented to person, place, and time. Appears well-developed and well-nourished, in no significant distress Head: Normocephalic and atraumatic.  Right Ear: External ear normal.  Left Ear: External ear normal.  Nose: Nose normal.  Mouth/Throat: Oropharynx is clear and moist.  Eyes: Conjunctivae and EOM are normal. Pupils are equal, round, and reactive to light.  Neck: Normal range of motion. Neck supple. No JVD present. No tracheal deviation present or significant neck LA or mass Cardiovascular: Normal rate, regular rhythm, normal heart sounds and intact distal pulses.   Pulmonary/Chest: Effort normal and breath sounds without rales or wheezing  Abdominal: Soft. Bowel sounds are normal. NT. No HSM but firm uterine fibroid noted low mid abd Musculoskeletal: Normal range of motion. Exhibits no edema.  Lymphadenopathy:  Has no cervical adenopathy.  Neurological: Pt is alert and oriented to person, place, and time. Pt has normal reflexes. No cranial nerve deficit. Motor grossly intact Skin: Skin is warm and dry. No rash noted.  Psychiatric:  Has normal mood and affect. Behavior is normal.     Assessment & Plan:

## 2015-09-30 NOTE — Assessment & Plan Note (Signed)

## 2016-03-01 ENCOUNTER — Other Ambulatory Visit (INDEPENDENT_AMBULATORY_CARE_PROVIDER_SITE_OTHER): Payer: BC Managed Care – PPO

## 2016-03-01 DIAGNOSIS — D509 Iron deficiency anemia, unspecified: Secondary | ICD-10-CM | POA: Diagnosis not present

## 2016-03-01 LAB — CBC WITH DIFFERENTIAL/PLATELET
Basophils Absolute: 0.1 10*3/uL (ref 0.0–0.1)
Basophils Relative: 1.1 % (ref 0.0–3.0)
Eosinophils Absolute: 0.2 10*3/uL (ref 0.0–0.7)
Eosinophils Relative: 2.6 % (ref 0.0–5.0)
HCT: 32.3 % — ABNORMAL LOW (ref 36.0–46.0)
Hemoglobin: 10.3 g/dL — ABNORMAL LOW (ref 12.0–15.0)
Lymphocytes Relative: 43.3 % (ref 12.0–46.0)
Lymphs Abs: 2.8 10*3/uL (ref 0.7–4.0)
MCHC: 31.9 g/dL (ref 30.0–36.0)
MCV: 62 fl — ABNORMAL LOW (ref 78.0–100.0)
Monocytes Absolute: 0.5 10*3/uL (ref 0.1–1.0)
Monocytes Relative: 7.8 % (ref 3.0–12.0)
Neutro Abs: 3 10*3/uL (ref 1.4–7.7)
Neutrophils Relative %: 45.2 % (ref 43.0–77.0)
Platelets: 403 10*3/uL — ABNORMAL HIGH (ref 150.0–400.0)
RBC: 5.22 Mil/uL — ABNORMAL HIGH (ref 3.87–5.11)
RDW: 17.5 % — ABNORMAL HIGH (ref 11.5–15.5)
WBC: 6.6 10*3/uL (ref 4.0–10.5)

## 2016-03-01 LAB — IBC PANEL
Iron: 21 ug/dL — ABNORMAL LOW (ref 42–145)
Saturation Ratios: 3.3 % — ABNORMAL LOW (ref 20.0–50.0)
Transferrin: 448 mg/dL — ABNORMAL HIGH (ref 212.0–360.0)

## 2016-07-23 ENCOUNTER — Other Ambulatory Visit: Payer: Self-pay | Admitting: Internal Medicine

## 2016-07-23 DIAGNOSIS — Z1231 Encounter for screening mammogram for malignant neoplasm of breast: Secondary | ICD-10-CM

## 2016-08-03 ENCOUNTER — Ambulatory Visit
Admission: RE | Admit: 2016-08-03 | Discharge: 2016-08-03 | Disposition: A | Discharge status: Home / Self Care | Payer: BC Managed Care – PPO | Source: Ambulatory Visit | Attending: Internal Medicine | Admitting: Internal Medicine

## 2016-08-03 DIAGNOSIS — Z1231 Encounter for screening mammogram for malignant neoplasm of breast: Secondary | ICD-10-CM

## 2016-09-29 ENCOUNTER — Other Ambulatory Visit (INDEPENDENT_AMBULATORY_CARE_PROVIDER_SITE_OTHER): Payer: BC Managed Care – PPO

## 2016-09-29 DIAGNOSIS — Z0000 Encounter for general adult medical examination without abnormal findings: Secondary | ICD-10-CM | POA: Diagnosis not present

## 2016-09-29 LAB — TSH: TSH: 1.53 u[IU]/mL (ref 0.35–4.50)

## 2016-09-29 LAB — HEPATIC FUNCTION PANEL
ALT: 8 U/L (ref 0–35)
AST: 11 U/L (ref 0–37)
Albumin: 3.6 g/dL (ref 3.5–5.2)
Alkaline Phosphatase: 84 U/L (ref 39–117)
Bilirubin, Direct: 0 mg/dL (ref 0.0–0.3)
Total Bilirubin: 0.2 mg/dL (ref 0.2–1.2)
Total Protein: 7.3 g/dL (ref 6.0–8.3)

## 2016-09-29 LAB — URINALYSIS, ROUTINE W REFLEX MICROSCOPIC
Bilirubin Urine: NEGATIVE
Ketones, ur: NEGATIVE
Leukocytes, UA: NEGATIVE
Nitrite: NEGATIVE
RBC / HPF: NONE SEEN (ref 0–?)
Specific Gravity, Urine: 1.02 (ref 1.000–1.030)
Total Protein, Urine: NEGATIVE
Urine Glucose: NEGATIVE
Urobilinogen, UA: 0.2 (ref 0.0–1.0)
WBC, UA: NONE SEEN (ref 0–?)
pH: 6 (ref 5.0–8.0)

## 2016-09-29 LAB — CBC WITH DIFFERENTIAL/PLATELET
Basophils Relative: 1 % (ref 0.0–3.0)
Eosinophils Relative: 5 % (ref 0.0–5.0)
HCT: 27 % — ABNORMAL LOW (ref 36.0–46.0)
Hemoglobin: 8.3 g/dL — ABNORMAL LOW (ref 12.0–15.0)
Lymphocytes Relative: 31 % (ref 12.0–46.0)
MCHC: 30.8 g/dL (ref 30.0–36.0)
MCV: 54.7 fl — ABNORMAL LOW (ref 78.0–100.0)
Monocytes Relative: 16 % — ABNORMAL HIGH (ref 3.0–12.0)
Neutrophils Relative %: 47 % (ref 43.0–77.0)
Platelets: 381 10*3/uL (ref 150.0–400.0)
RBC: 4.93 Mil/uL (ref 3.87–5.11)
RDW: 21.8 % — ABNORMAL HIGH (ref 11.5–15.5)
WBC: 6.9 10*3/uL (ref 4.0–10.5)

## 2016-09-29 LAB — BASIC METABOLIC PANEL
BUN: 9 mg/dL (ref 6–23)
CO2: 25 mEq/L (ref 19–32)
Calcium: 8.5 mg/dL (ref 8.4–10.5)
Chloride: 105 mEq/L (ref 96–112)
Creatinine, Ser: 0.86 mg/dL (ref 0.40–1.20)
GFR: 91.91 mL/min (ref >60.00)
Glucose, Bld: 95 mg/dL (ref 70–99)
Potassium: 4.2 mEq/L (ref 3.5–5.1)
Sodium: 138 mEq/L (ref 135–145)

## 2016-09-29 LAB — LIPID PANEL
Cholesterol: 162 mg/dL (ref 0–200)
HDL: 48.9 mg/dL (ref >39.00)
LDL Cholesterol: 97 mg/dL (ref 0–99)
NonHDL: 112.7
Total CHOL/HDL Ratio: 3
Triglycerides: 81 mg/dL (ref 0.0–149.0)
VLDL: 16.2 mg/dL (ref 0.0–40.0)

## 2016-09-30 ENCOUNTER — Encounter: Payer: Self-pay | Admitting: Internal Medicine

## 2016-09-30 ENCOUNTER — Ambulatory Visit (INDEPENDENT_AMBULATORY_CARE_PROVIDER_SITE_OTHER): Payer: BC Managed Care – PPO | Admitting: Internal Medicine

## 2016-09-30 VITALS — BP 130/78 | HR 70 | Temp 97.7°F | Resp 20 | Wt 200.0 lb

## 2016-09-30 DIAGNOSIS — E785 Hyperlipidemia, unspecified: Secondary | ICD-10-CM

## 2016-09-30 DIAGNOSIS — Z0001 Encounter for general adult medical examination with abnormal findings: Secondary | ICD-10-CM | POA: Diagnosis not present

## 2016-09-30 DIAGNOSIS — D509 Iron deficiency anemia, unspecified: Secondary | ICD-10-CM

## 2016-09-30 DIAGNOSIS — J4520 Mild intermittent asthma, uncomplicated: Secondary | ICD-10-CM | POA: Diagnosis not present

## 2016-09-30 NOTE — Patient Instructions (Signed)
Please take the OTC Iron sulfate 325 mg twice per day, and also consider taking Miralax 17 gm by mouth daily as well to avoid constipation  We will fax your labs to your GYN today  Please call your GYN by Mon Dec 11 to see if any change is needed in your treatment per GYN  Please go to the LAB in the Basement (turn left off the elevator) for the tests to be done in 4 weeks (repeat CBC)  Please continue all other medications as before, and refills have been done if requested.  Please have the pharmacy call with any other refills you may need.  Please continue your efforts at being more active, low cholesterol diet, and weight control.  You are otherwise up to date with prevention measures today.  Please keep your appointments with your specialists as you may have planned  Please return in 1 year for your yearly visit, or sooner if needed, with Lab testing done 3-5 days before

## 2016-09-30 NOTE — Progress Notes (Signed)
Pre visit review using our clinic review tool, if applicable. No additional management support is needed unless otherwise documented below in the visit note. 

## 2016-09-30 NOTE — Progress Notes (Signed)
Subjective:    Patient ID: Sue Beasley, female    DOB: 17-Nov-1970, 45 y.o.   MRN: [id number]  HPI  Here for wellness and f/u;  Overall doing ok;  Pt denies Chest pain, worsening SOB, DOE, wheezing, orthopnea, PND, worsening LE edema, palpitations, dizziness or syncope.  Pt denies neurological change such as new headache, facial or extremity weakness.  Pt denies polydipsia, polyuria, or low sugar symptoms. Pt states overall good compliance with treatment and medications, good tolerability, and has been trying to follow appropriate diet.  Pt denies worsening depressive symptoms, suicidal ideation or panic. No fever, night sweats, wt loss, loss of appetite, or other constitutional symptoms.  Pt states good ability with ADL's, has low fall risk, home safety reviewed and adequate, no other significant changes in hearing or vision, and only occasionally active with exercise.  Declines flu shot    Does have irreg periods and heavy assoc with known fibroids, with recent eval c/w no significant change, surgury not recommended.  Does c/o ongoing fatigue, but denies signficant daytime hypersomnolence. Past Medical History:  Diagnosis Date  . ALLERGIC RHINITIS 11/06/2009  . Anemia   . ANEMIA-IRON DEFICIENCY 11/20/2009  . Asthma   . Eczema   . Irreducible umbilical hernia 0000000  . Wears glasses    Past Surgical History:  Procedure Laterality Date  . UMBILICAL HERNIA REPAIR  05/13/11    reports that she has never smoked. She does not have any smokeless tobacco history on file. She reports that she does not drink alcohol or use drugs. family history includes Diabetes in her father; Hyperlipidemia in her mother; Hypertension in her father. Allergies  Allergen Reactions  . Amoxicillin-Pot Clavulanate     REACTION: rash  . Influenza Vaccines   . Peanut-Containing Drug Products   . Shellfish-Derived Products    Current Outpatient Prescriptions on File Prior to Visit  Medication Sig Dispense Refill  .  ALAYCEN 1/35 tablet Take 1 tablet by mouth daily.  9  . albuterol (PROAIR HFA) 108 (90 BASE) MCG/ACT inhaler Inhale 2 puffs into the lungs 4 (four) times daily.      Marland Kitchen azelastine (ASTELIN) 0.1 % nasal spray USE 1 SPRAY IN EACH NOSTRIL TWICE A DAY AS NEEDED  5  . desoximetasone (TOPICORT) 0.25 % cream Apply topically 2 (two) times daily.      . ferrous sulfate 325 (65 FE) MG tablet Take 325 mg by mouth daily.      . fluticasone (FLONASE) 50 MCG/ACT nasal spray 2 SPRAY IN EACH NOSTRIL ONCE A DAY NASALLY 30 DAY(S)  4  . Fluticasone-Salmeterol (ADVAIR DISKUS) 100-50 MCG/DOSE AEPB Inhale 1 puff into the lungs every 12 (twelve) hours.      Marland Kitchen loratadine (CLARITIN) 10 MG tablet Take 10 mg by mouth daily.      . Multiple Vitamin (MULTIVITAMIN) capsule Take 1 capsule by mouth daily.      Theola Sequin Biphasic (NECON 10/11, 28,) 0.5-35/1-35 MG-MCG TABS Take by mouth daily.       No current facility-administered medications on file prior to visit.    .    Review of Systems Constitutional: Negative for increased diaphoresis, or other activity, appetite or siginficant weight change other than noted HENT: Negative for worsening hearing loss, ear pain, facial swelling, mouth sores and neck stiffness.   Eyes: Negative for other worsening pain, redness or visual disturbance.  Respiratory: Negative for choking or stridor Cardiovascular: Negative for other chest pain and palpitations.  Gastrointestinal: Negative  for worsening diarrhea, blood in stool, or abdominal distention Genitourinary: Negative for hematuria, flank pain or change in urine volume.  Musculoskeletal: Negative for myalgias or other joint complaints.  Skin: Negative for other color change and wound or drainage.  Neurological: Negative for syncope and numbness. other than noted Hematological: Negative for adenopathy. or other swelling Psychiatric/Behavioral: Negative for hallucinations, SI, self-injury, decreased concentration or other  worsening agitation.  All other system neg per pt    Objective:   Physical Exam BP 130/78   Pulse 70   Temp 97.7 F (36.5 C) (Oral)   Resp 20   Wt 200 lb (90.7 kg)   SpO2 98%   BMI 37.79 kg/m  VS noted,  Constitutional: Pt is oriented to person, place, and time. Appears well-developed and well-nourished, in no significant distress Head: Normocephalic and atraumatic  Eyes: Conjunctivae and EOM are normal. Pupils are equal, round, and reactive to light Right Ear: External ear normal.  Left Ear: External ear normal Nose: Nose normal.  Mouth/Throat: Oropharynx is clear and moist  Neck: Normal range of motion. Neck supple. No JVD present. No tracheal deviation present or significant neck LA or mass Cardiovascular: Normal rate, regular rhythm, normal heart sounds and intact distal pulses.   Pulmonary/Chest: Effort normal and breath sounds without rales or wheezing  Abdominal: Soft. Bowel sounds are normal. NT. No HSM but has palpable uterine fibroids low mid abdomen Musculoskeletal: Normal range of motion. Exhibits no edema Lymphadenopathy: Has no cervical adenopathy.  Neurological: Pt is alert and oriented to person, place, and time. Pt has normal reflexes. No cranial nerve deficit. Motor grossly intact Skin: Skin is warm and dry. No rash noted or new ulcers Psychiatric:  Has normal mood and affect. Behavior is normal.  No other new exam findings    Assessment & Plan:

## 2016-10-02 NOTE — Assessment & Plan Note (Addendum)
Mod to severe, For iron bid, for cbc with lab faxed to GYN, pt to f/u with GYN and f/u cbc at 4 wks , Lab Results  Component Value Date   WBC 6.9 09/29/2016   HGB 8.3 Repeated and verified X2. (L) 09/29/2016   HCT 27.0 (L) 09/29/2016   MCV 54.7 Repeated and verified X2. (L) 09/29/2016   PLT 381.0 09/29/2016   In addition to the time spent performing CPE, I spent an additional 15 minutes face to face,in which greater than 50% of this time was spent in counseling and coordination of care for patient's illness as documented.

## 2016-10-02 NOTE — Assessment & Plan Note (Signed)
stable overall by history and exam, recent data reviewed with pt, and pt to continue medical treatment as before,  to f/u any worsening symptoms or concerns @LASTSAO2(3)@  

## 2016-10-02 NOTE — Assessment & Plan Note (Signed)
stable overall by history and exam, recent data reviewed with pt, and pt to continue medical treatment as before,  to f/u any worsening symptoms or concerns Lab Results  Component Value Date   LDLCALC 97 09/29/2016

## 2016-10-02 NOTE — Assessment & Plan Note (Signed)

## 2016-11-02 ENCOUNTER — Other Ambulatory Visit (INDEPENDENT_AMBULATORY_CARE_PROVIDER_SITE_OTHER): Payer: BC Managed Care – PPO

## 2016-11-02 DIAGNOSIS — D509 Iron deficiency anemia, unspecified: Secondary | ICD-10-CM

## 2016-11-02 LAB — CBC WITH DIFFERENTIAL/PLATELET
Basophils Relative: 0 % (ref 0.0–3.0)
Eosinophils Relative: 4.7 % (ref 0.0–5.0)
HCT: 34.5 % — ABNORMAL LOW (ref 36.0–46.0)
Hemoglobin: 11 g/dL — ABNORMAL LOW (ref 12.0–15.0)
Lymphocytes Relative: 49 % — ABNORMAL HIGH (ref 12.0–46.0)
MCHC: 31.8 g/dL (ref 30.0–36.0)
MCV: 60.6 fl — ABNORMAL LOW (ref 78.0–100.0)
Monocytes Relative: 8.4 % (ref 3.0–12.0)
Neutrophils Relative %: 37.9 % — ABNORMAL LOW (ref 43.0–77.0)
Platelets: 375 10*3/uL (ref 150.0–400.0)
RBC: 5.69 Mil/uL — ABNORMAL HIGH (ref 3.87–5.11)
RDW: 30.8 % — ABNORMAL HIGH (ref 11.5–15.5)
WBC: 5.9 10*3/uL (ref 4.0–10.5)

## 2017-07-28 ENCOUNTER — Other Ambulatory Visit: Payer: Self-pay | Admitting: Internal Medicine

## 2017-07-28 DIAGNOSIS — Z1231 Encounter for screening mammogram for malignant neoplasm of breast: Secondary | ICD-10-CM

## 2017-08-04 ENCOUNTER — Ambulatory Visit
Admission: RE | Admit: 2017-08-04 | Discharge: 2017-08-04 | Disposition: A | Discharge status: Home / Self Care | Payer: BC Managed Care – PPO | Source: Ambulatory Visit | Attending: Internal Medicine | Admitting: Internal Medicine

## 2017-08-04 DIAGNOSIS — Z1231 Encounter for screening mammogram for malignant neoplasm of breast: Secondary | ICD-10-CM

## 2017-09-29 ENCOUNTER — Other Ambulatory Visit (INDEPENDENT_AMBULATORY_CARE_PROVIDER_SITE_OTHER): Payer: BC Managed Care – PPO

## 2017-09-29 DIAGNOSIS — Z0001 Encounter for general adult medical examination with abnormal findings: Secondary | ICD-10-CM | POA: Diagnosis not present

## 2017-09-29 LAB — URINALYSIS, ROUTINE W REFLEX MICROSCOPIC
Ketones, ur: NEGATIVE
Leukocytes, UA: NEGATIVE
Nitrite: NEGATIVE
Specific Gravity, Urine: >=1.03 — AB (ref 1.000–1.030)
Urine Glucose: NEGATIVE
Urobilinogen, UA: 0.2 (ref 0.0–1.0)
pH: 5.5 (ref 5.0–8.0)

## 2017-09-29 LAB — CBC WITH DIFFERENTIAL/PLATELET
Basophils Absolute: 0.1 10*3/uL (ref 0.0–0.1)
Basophils Relative: 1.6 % (ref 0.0–3.0)
Eosinophils Absolute: 0.2 10*3/uL (ref 0.0–0.7)
Eosinophils Relative: 2.5 % (ref 0.0–5.0)
HCT: 36.6 % (ref 36.0–46.0)
Hemoglobin: 11.7 g/dL — ABNORMAL LOW (ref 12.0–15.0)
Lymphocytes Relative: 42.9 % (ref 12.0–46.0)
Lymphs Abs: 2.7 10*3/uL (ref 0.7–4.0)
MCHC: 32 g/dL (ref 30.0–36.0)
MCV: 71.8 fl — ABNORMAL LOW (ref 78.0–100.0)
Monocytes Absolute: 0.5 10*3/uL (ref 0.1–1.0)
Monocytes Relative: 7.3 % (ref 3.0–12.0)
Neutro Abs: 2.9 10*3/uL (ref 1.4–7.7)
Neutrophils Relative %: 45.7 % (ref 43.0–77.0)
Platelets: 352 10*3/uL (ref 150.0–400.0)
RBC: 5.1 Mil/uL (ref 3.87–5.11)
RDW: 14.7 % (ref 11.5–15.5)
WBC: 6.3 10*3/uL (ref 4.0–10.5)

## 2017-09-29 LAB — HEPATIC FUNCTION PANEL
ALT: 17 U/L (ref 0–35)
AST: 16 U/L (ref 0–37)
Albumin: 3.9 g/dL (ref 3.5–5.2)
Alkaline Phosphatase: 89 U/L (ref 39–117)
Bilirubin, Direct: 0.1 mg/dL (ref 0.0–0.3)
Total Bilirubin: 0.3 mg/dL (ref 0.2–1.2)
Total Protein: 7.1 g/dL (ref 6.0–8.3)

## 2017-09-29 LAB — LIPID PANEL
Cholesterol: 179 mg/dL (ref 0–200)
HDL: 48.6 mg/dL (ref >39.00)
LDL Cholesterol: 105 mg/dL — ABNORMAL HIGH (ref 0–99)
NonHDL: 129.9
Total CHOL/HDL Ratio: 4
Triglycerides: 125 mg/dL (ref 0.0–149.0)
VLDL: 25 mg/dL (ref 0.0–40.0)

## 2017-09-29 LAB — BASIC METABOLIC PANEL
BUN: 9 mg/dL (ref 6–23)
CO2: 25 mEq/L (ref 19–32)
Calcium: 8.5 mg/dL (ref 8.4–10.5)
Chloride: 104 mEq/L (ref 96–112)
Creatinine, Ser: 0.87 mg/dL (ref 0.40–1.20)
GFR: 90.28 mL/min (ref >60.00)
Glucose, Bld: 86 mg/dL (ref 70–99)
Potassium: 3.7 mEq/L (ref 3.5–5.1)
Sodium: 139 mEq/L (ref 135–145)

## 2017-09-29 LAB — TSH: TSH: 1.09 u[IU]/mL (ref 0.35–4.50)

## 2017-10-04 ENCOUNTER — Encounter: Payer: BC Managed Care – PPO | Admitting: Internal Medicine

## 2017-11-04 ENCOUNTER — Encounter: Payer: Self-pay | Admitting: Internal Medicine

## 2017-11-04 ENCOUNTER — Ambulatory Visit (INDEPENDENT_AMBULATORY_CARE_PROVIDER_SITE_OTHER): Payer: BC Managed Care – PPO | Admitting: Internal Medicine

## 2017-11-04 VITALS — BP 124/76 | HR 64 | Temp 98.6°F | Ht 61.0 in | Wt 195.0 lb

## 2017-11-04 DIAGNOSIS — Z0000 Encounter for general adult medical examination without abnormal findings: Secondary | ICD-10-CM | POA: Diagnosis not present

## 2017-11-04 NOTE — Progress Notes (Signed)
Subjective:    Patient ID: Sue Beasley, female    DOB: [birth year], 47 y.o.   MRN: [id number]  HPI  Here for wellness and f/u;  Overall doing ok;  Pt denies Chest pain, worsening SOB, DOE, wheezing, orthopnea, PND, worsening LE edema, palpitations, dizziness or syncope.  Pt denies neurological change such as new headache, facial or extremity weakness.  Pt denies polydipsia, polyuria, or low sugar symptoms. Pt states overall good compliance with treatment and medications, good tolerability, and has been trying to follow appropriate diet.  Pt denies worsening depressive symptoms, suicidal ideation or panic. No fever, night sweats, wt loss, loss of appetite, or other constitutional symptoms.  Pt states good ability with ADL's, has low fall risk, home safety reviewed and adequate, no other significant changes in hearing or vision, and only occasionally active with exercise. Declines flu shot  Lost wt with more running. Has been seeing GYN at least yearly for uterine fibroids with menorrhagia and hx of iron def anemia, more compliant with iron recently.   Wt Readings from Last 3 Encounters:  11/04/17 195 lb (88.5 kg)  09/30/16 200 lb (90.7 kg)  09/30/15 211 lb 4 oz (95.8 kg)  No other interval hx or new complaints Past Medical History:  Diagnosis Date  . ALLERGIC RHINITIS 11/06/2009  . Anemia   . ANEMIA-IRON DEFICIENCY 11/20/2009  . Asthma   . Eczema   . Irreducible umbilical hernia 0/05/6577  . Wears glasses    Past Surgical History:  Procedure Laterality Date  . UMBILICAL HERNIA REPAIR  05/13/11    reports that  has never smoked. she has never used smokeless tobacco. She reports that she does not drink alcohol or use drugs. family history includes Diabetes in her father; Hyperlipidemia in her mother; Hypertension in her father. Allergies  Allergen Reactions  . Amoxicillin-Pot Clavulanate     REACTION: rash  . Influenza Vaccines   . Peanut-Containing Drug Products   . Shellfish-Derived  Products    Current Outpatient Medications on File Prior to Visit  Medication Sig Dispense Refill  . ALAYCEN 1/35 tablet Take 1 tablet by mouth daily.  9  . albuterol (PROAIR HFA) 108 (90 BASE) MCG/ACT inhaler Inhale 2 puffs into the lungs 4 (four) times daily.      Marland Kitchen azelastine (ASTELIN) 0.1 % nasal spray USE 1 SPRAY IN EACH NOSTRIL TWICE A DAY AS NEEDED  5  . desoximetasone (TOPICORT) 0.25 % cream Apply topically 2 (two) times daily.      . ferrous sulfate 325 (65 FE) MG tablet Take 325 mg by mouth daily.      . fluticasone (FLONASE) 50 MCG/ACT nasal spray 2 SPRAY IN EACH NOSTRIL ONCE A DAY NASALLY 30 DAY(S)  4  . Fluticasone-Salmeterol (ADVAIR DISKUS) 100-50 MCG/DOSE AEPB Inhale 1 puff into the lungs every 12 (twelve) hours.      Marland Kitchen loratadine (CLARITIN) 10 MG tablet Take 10 mg by mouth daily.      . Multiple Vitamin (MULTIVITAMIN) capsule Take 1 capsule by mouth daily.       No current facility-administered medications on file prior to visit.    Review of Systems Constitutional: Negative for other unusual diaphoresis, sweats, appetite or weight changes HENT: Negative for other worsening hearing loss, ear pain, facial swelling, mouth sores or neck stiffness.   Eyes: Negative for other worsening pain, redness or other visual disturbance.  Respiratory: Negative for other stridor or swelling Cardiovascular: Negative for other palpitations or other chest pain  Gastrointestinal: Negative for worsening diarrhea or loose stools, blood in stool, distention or other pain Genitourinary: Negative for hematuria, flank pain or other change in urine volume.  Musculoskeletal: Negative for myalgias or other joint swelling.  Skin: Negative for other color change, or other wound or worsening drainage.  Neurological: Negative for other syncope or numbness. Hematological: Negative for other adenopathy or swelling Psychiatric/Behavioral: Negative for hallucinations, other worsening agitation, SI,  self-injury, or new decreased concentration All other system neg per pt    Objective:   Physical Exam BP 124/76   Pulse 64   Temp 98.6 F (37 C) (Oral)   Ht 5\' 1"  (1.549 m)   Wt 195 lb (88.5 kg)   SpO2 100%   BMI 36.84 kg/m  VS noted,  Constitutional: Pt is oriented to person, place, and time. Appears well-developed and well-nourished, in no significant distress and comfortable Head: Normocephalic and atraumatic  Eyes: Conjunctivae and EOM are normal. Pupils are equal, round, and reactive to light Right Ear: External ear normal without discharge Left Ear: External ear normal without discharge Nose: Nose without discharge or deformity Mouth/Throat: Oropharynx is without other ulcerations and moist  Neck: Normal range of motion. Neck supple. No JVD present. No tracheal deviation present or significant neck LA or mass Cardiovascular: Normal rate, regular rhythm, normal heart sounds and intact distal pulses.   Pulmonary/Chest: WOB normal and breath sounds without rales or wheezing  Abdominal: Soft. Bowel sounds are normal. NT. No HSM  Musculoskeletal: Normal range of motion. Exhibits no edema Lymphadenopathy: Has no other cervical adenopathy.  Neurological: Pt is alert and oriented to person, place, and time. Pt has normal reflexes. No cranial nerve deficit. Motor grossly intact, Gait intact Skin: Skin is warm and dry. No rash noted or new ulcerations Psychiatric:  Has normal mood and affect. Behavior is normal without agitation No other exam findings    Assessment & Plan:

## 2017-11-04 NOTE — Patient Instructions (Signed)
Please continue all other medications as before, and refills have been done if requested.  Please have the pharmacy call with any other refills you may need.  Please continue your efforts at being more active, low cholesterol diet, and weight control.  You are otherwise up to date with prevention measures today.  Please keep your appointments with your specialists as you may have planned  Please return in 1 year for your yearly visit, or sooner if needed, with Lab testing done 3-5 days before  

## 2017-11-05 ENCOUNTER — Encounter: Payer: Self-pay | Admitting: Internal Medicine

## 2017-11-05 NOTE — Assessment & Plan Note (Signed)

## 2018-08-22 ENCOUNTER — Other Ambulatory Visit: Payer: Self-pay | Admitting: Internal Medicine

## 2018-08-22 DIAGNOSIS — Z1231 Encounter for screening mammogram for malignant neoplasm of breast: Secondary | ICD-10-CM

## 2018-08-24 ENCOUNTER — Ambulatory Visit
Admission: RE | Admit: 2018-08-24 | Discharge: 2018-08-24 | Disposition: A | Discharge status: Home / Self Care | Payer: BC Managed Care – PPO | Source: Ambulatory Visit | Attending: Internal Medicine | Admitting: Internal Medicine

## 2018-08-24 DIAGNOSIS — Z1231 Encounter for screening mammogram for malignant neoplasm of breast: Secondary | ICD-10-CM

## 2018-11-02 DIAGNOSIS — R638 Other symptoms and signs concerning food and fluid intake: Secondary | ICD-10-CM | POA: Insufficient documentation

## 2018-11-07 ENCOUNTER — Ambulatory Visit: Payer: BC Managed Care – PPO | Admitting: Internal Medicine

## 2018-11-07 ENCOUNTER — Encounter: Payer: Self-pay | Admitting: Internal Medicine

## 2018-11-07 ENCOUNTER — Other Ambulatory Visit (INDEPENDENT_AMBULATORY_CARE_PROVIDER_SITE_OTHER): Payer: BC Managed Care – PPO

## 2018-11-07 VITALS — BP 124/82 | HR 66 | Temp 98.5°F | Ht 61.0 in | Wt 201.0 lb

## 2018-11-07 DIAGNOSIS — D509 Iron deficiency anemia, unspecified: Secondary | ICD-10-CM | POA: Diagnosis not present

## 2018-11-07 DIAGNOSIS — Z0000 Encounter for general adult medical examination without abnormal findings: Secondary | ICD-10-CM | POA: Diagnosis not present

## 2018-11-07 LAB — URINALYSIS, ROUTINE W REFLEX MICROSCOPIC
Bilirubin Urine: NEGATIVE
Ketones, ur: NEGATIVE
Leukocytes, UA: NEGATIVE
Nitrite: NEGATIVE
Specific Gravity, Urine: <=1.005 — AB (ref 1.000–1.030)
Total Protein, Urine: NEGATIVE
Urine Glucose: NEGATIVE
Urobilinogen, UA: 0.2 (ref 0.0–1.0)
WBC, UA: NONE SEEN (ref 0–?)
pH: 7 (ref 5.0–8.0)

## 2018-11-07 LAB — HEPATIC FUNCTION PANEL
ALT: 10 U/L (ref 0–35)
AST: 12 U/L (ref 0–37)
Albumin: 3.9 g/dL (ref 3.5–5.2)
Alkaline Phosphatase: 67 U/L (ref 39–117)
Bilirubin, Direct: 0 mg/dL (ref 0.0–0.3)
Total Bilirubin: 0.2 mg/dL (ref 0.2–1.2)
Total Protein: 7.4 g/dL (ref 6.0–8.3)

## 2018-11-07 LAB — BASIC METABOLIC PANEL
BUN: 9 mg/dL (ref 6–23)
CO2: 27 mEq/L (ref 19–32)
Calcium: 8.3 mg/dL — ABNORMAL LOW (ref 8.4–10.5)
Chloride: 104 mEq/L (ref 96–112)
Creatinine, Ser: 0.82 mg/dL (ref 0.40–1.20)
GFR: 96.2 mL/min (ref >60.00)
Glucose, Bld: 102 mg/dL — ABNORMAL HIGH (ref 70–99)
Potassium: 4 mEq/L (ref 3.5–5.1)
Sodium: 137 mEq/L (ref 135–145)

## 2018-11-07 LAB — LIPID PANEL
Cholesterol: 183 mg/dL (ref 0–200)
HDL: 49.1 mg/dL (ref >39.00)
LDL Cholesterol: 111 mg/dL — ABNORMAL HIGH (ref 0–99)
NonHDL: 133.45
Total CHOL/HDL Ratio: 4
Triglycerides: 110 mg/dL (ref 0.0–149.0)
VLDL: 22 mg/dL (ref 0.0–40.0)

## 2018-11-07 LAB — TSH: TSH: 1.41 u[IU]/mL (ref 0.35–4.50)

## 2018-11-07 NOTE — Progress Notes (Signed)
Subjective:    Patient ID: Sue Beasley, female    DOB: [birth year], 48 y.o.   MRN: [id number]  HPI  Here for wellness and f/u;  Overall doing ok;  Pt denies Chest pain, worsening SOB, DOE, wheezing, orthopnea, PND, worsening LE edema, palpitations, dizziness or syncope.  Pt denies neurological change such as new headache, facial or extremity weakness.  Pt denies polydipsia, polyuria, or low sugar symptoms. Pt states overall good compliance with treatment and medications, good tolerability, and has been trying to follow appropriate diet.  Pt denies worsening depressive symptoms, suicidal ideation or panic. No fever, night sweats, wt loss, loss of appetite, or other constitutional symptoms.  Pt states good ability with ADL's, has low fall risk, home safety reviewed and adequate, no other significant changes in hearing or vision, and only occasionally active with exercise. No new complaints  Pt with menses today  Plans to f/u with GYN soon with known large fibroids Past Medical History:  Diagnosis Date  . ALLERGIC RHINITIS 11/06/2009  . Anemia   . ANEMIA-IRON DEFICIENCY 11/20/2009  . Asthma   . Eczema   . Irreducible umbilical hernia 02/25/2705  . Wears glasses    Past Surgical History:  Procedure Laterality Date  . UMBILICAL HERNIA REPAIR  05/13/11    reports that she has never smoked. She has never used smokeless tobacco. She reports that she does not drink alcohol or use drugs. family history includes Diabetes in her father; Hyperlipidemia in her mother; Hypertension in her father. Allergies  Allergen Reactions  . Amoxicillin-Pot Clavulanate     REACTION: rash  . Influenza Vaccines   . Peanut-Containing Drug Products   . Shellfish-Derived Products    Current Outpatient Medications on File Prior to Visit  Medication Sig Dispense Refill  . ALAYCEN 1/35 tablet Take 1 tablet by mouth daily.  9  . albuterol (PROAIR HFA) 108 (90 BASE) MCG/ACT inhaler Inhale 2 puffs into the lungs 4 (four)  times daily.      Marland Kitchen azelastine (ASTELIN) 0.1 % nasal spray USE 1 SPRAY IN EACH NOSTRIL TWICE A DAY AS NEEDED  5  . desoximetasone (TOPICORT) 0.25 % cream Apply topically 2 (two) times daily.      . ferrous sulfate 325 (65 FE) MG tablet Take 325 mg by mouth daily.      . fluticasone (FLONASE) 50 MCG/ACT nasal spray 2 SPRAY IN EACH NOSTRIL ONCE A DAY NASALLY 30 DAY(S)  4  . Fluticasone-Salmeterol (ADVAIR DISKUS) 100-50 MCG/DOSE AEPB Inhale 1 puff into the lungs every 12 (twelve) hours.      Marland Kitchen loratadine (CLARITIN) 10 MG tablet Take 10 mg by mouth daily.      . Multiple Vitamin (MULTIVITAMIN) capsule Take 1 capsule by mouth daily.       No current facility-administered medications on file prior to visit.    Review of Systems Constitutional: Negative for other unusual diaphoresis, sweats, appetite or weight changes HENT: Negative for other worsening hearing loss, ear pain, facial swelling, mouth sores or neck stiffness.   Eyes: Negative for other worsening pain, redness or other visual disturbance.  Respiratory: Negative for other stridor or swelling Cardiovascular: Negative for other palpitations or other chest pain  Gastrointestinal: Negative for worsening diarrhea or loose stools, blood in stool, distention or other pain Genitourinary: Negative for hematuria, flank pain or other change in urine volume.  Musculoskeletal: Negative for myalgias or other joint swelling.  Skin: Negative for other color change, or other wound or worsening  drainage.  Neurological: Negative for other syncope or numbness. Hematological: Negative for other adenopathy or swelling Psychiatric/Behavioral: Negative for hallucinations, other worsening agitation, SI, self-injury, or new decreased concentration All other system neg per pt    Objective:   Physical Exam BP 124/82   Pulse 66   Temp 98.5 F (36.9 C) (Oral)   Ht 5\' 1"  (1.549 m)   Wt 201 lb (91.2 kg)   SpO2 98%   BMI 37.98 kg/m  VS noted,    Constitutional: Pt is oriented to person, place, and time. Appears well-developed and well-nourished, in no significant distress and comfortable Head: Normocephalic and atraumatic  Eyes: Conjunctivae and EOM are normal. Pupils are equal, round, and reactive to light Right Ear: External ear normal without discharge Left Ear: External ear normal without discharge Nose: Nose without discharge or deformity Mouth/Throat: Oropharynx is without other ulcerations and moist  Neck: Normal range of motion. Neck supple. No JVD present. No tracheal deviation present or significant neck LA or mass Cardiovascular: Normal rate, regular rhythm, normal heart sounds and intact distal pulses.   Pulmonary/Chest: WOB normal and breath sounds without rales or wheezing  Abdominal: Soft. Bowel sounds are normal. NT. No HSM  Musculoskeletal: Normal range of motion. Exhibits no edema Lymphadenopathy: Has no other cervical adenopathy.  Neurological: Pt is alert and oriented to person, place, and time. Pt has normal reflexes. No cranial nerve deficit. Motor grossly intact, Gait intact Skin: Skin is warm and dry. No rash noted or new ulcerations Psychiatric:  Has normal mood and affect. Behavior is normal without agitation No other exam findings Lab Results  Component Value Date   WBC 6.3 09/29/2017   HGB 11.7 (L) 09/29/2017   HCT 36.6 09/29/2017   PLT 352.0 09/29/2017   GLUCOSE 102 (H) 11/07/2018   CHOL 183 11/07/2018   TRIG 110.0 11/07/2018   HDL 49.10 11/07/2018   LDLDIRECT 141.6 11/06/2009   LDLCALC 111 (H) 11/07/2018   ALT 10 11/07/2018   AST 12 11/07/2018   NA 137 11/07/2018   K 4.0 11/07/2018   CL 104 11/07/2018   CREATININE 0.82 11/07/2018   BUN 9 11/07/2018   CO2 27 11/07/2018   TSH 1.41 11/07/2018        Assessment & Plan:

## 2018-11-07 NOTE — Assessment & Plan Note (Signed)
Cbc with hgb > 12 per pt report yesterday with GYN

## 2018-11-07 NOTE — Assessment & Plan Note (Signed)

## 2018-11-07 NOTE — Patient Instructions (Signed)

## 2018-11-08 LAB — HIV ANTIBODY (ROUTINE TESTING W REFLEX): HIV 1&2 Ab, 4th Generation: NONREACTIVE

## 2019-02-12 ENCOUNTER — Encounter: Payer: Self-pay | Admitting: Internal Medicine

## 2019-02-12 ENCOUNTER — Ambulatory Visit (INDEPENDENT_AMBULATORY_CARE_PROVIDER_SITE_OTHER): Payer: BC Managed Care – PPO | Admitting: Internal Medicine

## 2019-02-12 DIAGNOSIS — J069 Acute upper respiratory infection, unspecified: Secondary | ICD-10-CM

## 2019-02-12 DIAGNOSIS — J4520 Mild intermittent asthma, uncomplicated: Secondary | ICD-10-CM | POA: Diagnosis not present

## 2019-02-12 DIAGNOSIS — J309 Allergic rhinitis, unspecified: Secondary | ICD-10-CM | POA: Diagnosis not present

## 2019-02-12 MED ORDER — PREDNISONE 10 MG PO TABS: ORAL_TABLET | ORAL | 0 refills | Status: DC

## 2019-02-12 MED ORDER — AZITHROMYCIN 250 MG PO TABS: ORAL_TABLET | ORAL | 1 refills | Status: DC

## 2019-02-12 NOTE — Assessment & Plan Note (Signed)
Mild to mod, for antibx course,  to f/u any worsening symptoms or concerns 

## 2019-02-12 NOTE — Progress Notes (Signed)
Patient ID: Sue Beasley, female   DOB: Mar 19, 1971, 48 y.o.   MRN: [id number]  Virtual Visit via Video Note  I connected with Sue Beasley on 02/12/19 at  2:00 PM EDT by a video enabled telemedicine application and verified that I am speaking with the correct person using two identifiers.  Pt is at home, I am at office, no other persons present   I discussed the limitations of evaluation and management by telemedicine and the availability of in person appointments. The patient expressed understanding and agreed to proceed.  History of Present Illness:  Here with 2-3 days acute onset fever, facial pain, left ear pressure and fullness and muffled hearing, headache, general weakness and malaise, and greenish d/c, with mild ST and cough, but pt denies chest pain, wheezing, increased sob or doe, orthopnea, PND, increased LE swelling, palpitations, dizziness or syncope.  Does have several wks ongoing nasal allergy symptoms with clearish congestion, itch and sneezing, without fever, pain, ST, cough, swelling or wheezing.    Pt denies polydipsia, polyuria Past Medical History:  Diagnosis Date  . ALLERGIC RHINITIS 11/06/2009  . Anemia   . ANEMIA-IRON DEFICIENCY 11/20/2009  . Asthma   . Eczema   . Irreducible umbilical hernia 03/26/7034  . Wears glasses    Past Surgical History:  Procedure Laterality Date  . UMBILICAL HERNIA REPAIR  05/13/11    reports that she has never smoked. She has never used smokeless tobacco. She reports that she does not drink alcohol or use drugs. family history includes Diabetes in her father; Hyperlipidemia in her mother; Hypertension in her father. Allergies  Allergen Reactions  . Amoxicillin-Pot Clavulanate     REACTION: rash  . Influenza Vaccines   . Peanut-Containing Drug Products   . Shellfish-Derived Products    Current Outpatient Medications on File Prior to Visit  Medication Sig Dispense Refill  . ALAYCEN 1/35 tablet Take 1 tablet by mouth daily.  9  .  albuterol (PROAIR HFA) 108 (90 BASE) MCG/ACT inhaler Inhale 2 puffs into the lungs 4 (four) times daily.      Marland Kitchen azelastine (ASTELIN) 0.1 % nasal spray USE 1 SPRAY IN EACH NOSTRIL TWICE A DAY AS NEEDED  5  . desoximetasone (TOPICORT) 0.25 % cream Apply topically 2 (two) times daily.      . ferrous sulfate 325 (65 FE) MG tablet Take 325 mg by mouth daily.      . fluticasone (FLONASE) 50 MCG/ACT nasal spray 2 SPRAY IN EACH NOSTRIL ONCE A DAY NASALLY 30 DAY(S)  4  . Fluticasone-Salmeterol (ADVAIR DISKUS) 100-50 MCG/DOSE AEPB Inhale 1 puff into the lungs every 12 (twelve) hours.      Marland Kitchen loratadine (CLARITIN) 10 MG tablet Take 10 mg by mouth daily.      . Multiple Vitamin (MULTIVITAMIN) capsule Take 1 capsule by mouth daily.       No current facility-administered medications on file prior to visit.     Observations/Objective: Alert, NAD, mentating well, mild ill appaering, cn 2-12 intact, moves all 4s, no rash or swelling visible Lab Results  Component Value Date   WBC 6.3 09/29/2017   HGB 11.7 (L) 09/29/2017   HCT 36.6 09/29/2017   PLT 352.0 09/29/2017   GLUCOSE 102 (H) 11/07/2018   CHOL 183 11/07/2018   TRIG 110.0 11/07/2018   HDL 49.10 11/07/2018   LDLDIRECT 141.6 11/06/2009   LDLCALC 111 (H) 11/07/2018   ALT 10 11/07/2018   AST 12 11/07/2018   NA 137 11/07/2018  K 4.0 11/07/2018   CL 104 11/07/2018   CREATININE 0.82 11/07/2018   BUN 9 11/07/2018   CO2 27 11/07/2018   TSH 1.41 11/07/2018    Assessment and Plan: See notes  Follow Up Instructions: See notes   I discussed the assessment and treatment plan with the patient. The patient was provided an opportunity to ask questions and all were answered. The patient agreed with the plan and demonstrated an understanding of the instructions.   The patient was advised to call back or seek an in-person evaluation if the symptoms worsen or if the condition fails to improve as anticipated   Cathlean Cower, MD

## 2019-02-12 NOTE — Patient Instructions (Signed)
Please take all new medication as prescribed  Please continue all other medications as before, and refills have been done if requested.  You can also take Delsym OTC for cough, and/or Mucinex (or it's generic off brand) for congestion, and tylenol as needed for pain.  Please have the pharmacy call with any other refills you may need.  Please continue your efforts at being more active, low cholesterol diet, and weight control..  Please keep your appointments with your specialists as you may have planned

## 2019-02-12 NOTE — Assessment & Plan Note (Signed)
With seasonal flare it seems, for predpac asd,  to f/u any worsening symptoms or concerns

## 2019-02-12 NOTE — Assessment & Plan Note (Signed)
stable overall by history and exam, recent data reviewed with pt, and pt to continue medical treatment as before,  to f/u any worsening symptoms or concerns  

## 2019-07-30 ENCOUNTER — Other Ambulatory Visit: Payer: Self-pay | Admitting: Internal Medicine

## 2019-07-30 DIAGNOSIS — Z1231 Encounter for screening mammogram for malignant neoplasm of breast: Secondary | ICD-10-CM

## 2019-09-12 ENCOUNTER — Other Ambulatory Visit: Payer: Self-pay

## 2019-09-12 ENCOUNTER — Ambulatory Visit
Admission: RE | Admit: 2019-09-12 | Discharge: 2019-09-12 | Disposition: A | Discharge status: Home / Self Care | Payer: BC Managed Care – PPO | Source: Ambulatory Visit | Attending: Internal Medicine | Admitting: Internal Medicine

## 2019-09-12 DIAGNOSIS — Z1231 Encounter for screening mammogram for malignant neoplasm of breast: Secondary | ICD-10-CM

## 2019-11-09 ENCOUNTER — Other Ambulatory Visit: Payer: Self-pay

## 2019-11-09 ENCOUNTER — Ambulatory Visit (INDEPENDENT_AMBULATORY_CARE_PROVIDER_SITE_OTHER): Payer: BC Managed Care – PPO | Admitting: Internal Medicine

## 2019-11-09 ENCOUNTER — Encounter: Payer: Self-pay | Admitting: Internal Medicine

## 2019-11-09 VITALS — BP 126/82 | HR 88 | Temp 98.2°F | Ht 61.0 in | Wt 203.0 lb

## 2019-11-09 DIAGNOSIS — D509 Iron deficiency anemia, unspecified: Secondary | ICD-10-CM

## 2019-11-09 DIAGNOSIS — D259 Leiomyoma of uterus, unspecified: Secondary | ICD-10-CM | POA: Insufficient documentation

## 2019-11-09 DIAGNOSIS — E538 Deficiency of other specified B group vitamins: Secondary | ICD-10-CM

## 2019-11-09 DIAGNOSIS — Z0000 Encounter for general adult medical examination without abnormal findings: Secondary | ICD-10-CM

## 2019-11-09 DIAGNOSIS — R739 Hyperglycemia, unspecified: Secondary | ICD-10-CM

## 2019-11-09 DIAGNOSIS — Z0001 Encounter for general adult medical examination with abnormal findings: Secondary | ICD-10-CM | POA: Diagnosis not present

## 2019-11-09 DIAGNOSIS — J069 Acute upper respiratory infection, unspecified: Secondary | ICD-10-CM

## 2019-11-09 DIAGNOSIS — Z23 Encounter for immunization: Secondary | ICD-10-CM

## 2019-11-09 DIAGNOSIS — E559 Vitamin D deficiency, unspecified: Secondary | ICD-10-CM

## 2019-11-09 LAB — CBC WITH DIFFERENTIAL/PLATELET
Basophils Absolute: 0.1 10*3/uL (ref 0.0–0.1)
Basophils Relative: 1 % (ref 0.0–3.0)
Eosinophils Absolute: 0.2 10*3/uL (ref 0.0–0.7)
Eosinophils Relative: 3.4 % (ref 0.0–5.0)
HCT: 40.4 % (ref 36.0–46.0)
Hemoglobin: 12.9 g/dL (ref 12.0–15.0)
Lymphocytes Relative: 34.3 % (ref 12.0–46.0)
Lymphs Abs: 1.8 10*3/uL (ref 0.7–4.0)
MCHC: 32 g/dL (ref 30.0–36.0)
MCV: 71.8 fl — ABNORMAL LOW (ref 78.0–100.0)
Monocytes Absolute: 0.4 10*3/uL (ref 0.1–1.0)
Monocytes Relative: 7.7 % (ref 3.0–12.0)
Neutro Abs: 2.8 10*3/uL (ref 1.4–7.7)
Neutrophils Relative %: 53.6 % (ref 43.0–77.0)
Platelets: 266 10*3/uL (ref 150.0–400.0)
RBC: 5.62 Mil/uL — ABNORMAL HIGH (ref 3.87–5.11)
RDW: 14.9 % (ref 11.5–15.5)
WBC: 5.2 10*3/uL (ref 4.0–10.5)

## 2019-11-09 LAB — HEPATIC FUNCTION PANEL
ALT: 19 U/L (ref 0–35)
AST: 16 U/L (ref 0–37)
Albumin: 4 g/dL (ref 3.5–5.2)
Alkaline Phosphatase: 67 U/L (ref 39–117)
Bilirubin, Direct: 0 mg/dL (ref 0.0–0.3)
Total Bilirubin: 0.3 mg/dL (ref 0.2–1.2)
Total Protein: 7.4 g/dL (ref 6.0–8.3)

## 2019-11-09 LAB — LIPID PANEL
Cholesterol: 181 mg/dL (ref 0–200)
HDL: 50.2 mg/dL (ref >39.00)
LDL Cholesterol: 116 mg/dL — ABNORMAL HIGH (ref 0–99)
NonHDL: 130.32
Total CHOL/HDL Ratio: 4
Triglycerides: 73 mg/dL (ref 0.0–149.0)
VLDL: 14.6 mg/dL (ref 0.0–40.0)

## 2019-11-09 LAB — BASIC METABOLIC PANEL
BUN: 10 mg/dL (ref 6–23)
CO2: 27 mEq/L (ref 19–32)
Calcium: 9.2 mg/dL (ref 8.4–10.5)
Chloride: 103 mEq/L (ref 96–112)
Creatinine, Ser: 0.73 mg/dL (ref 0.40–1.20)
GFR: 103.06 mL/min (ref >60.00)
Glucose, Bld: 103 mg/dL — ABNORMAL HIGH (ref 70–99)
Potassium: 3.8 mEq/L (ref 3.5–5.1)
Sodium: 137 mEq/L (ref 135–145)

## 2019-11-09 LAB — IBC PANEL
Iron: 58 ug/dL (ref 42–145)
Saturation Ratios: 12.4 % — ABNORMAL LOW (ref 20.0–50.0)
Transferrin: 333 mg/dL (ref 212.0–360.0)

## 2019-11-09 LAB — HEMOGLOBIN A1C: Hgb A1c MFr Bld: 5.5 % (ref 4.6–6.5)

## 2019-11-09 NOTE — Progress Notes (Signed)
Subjective:    Patient ID: Sue Beasley, female    DOB: 1971-03-04, 49 y.o.   MRN: [id number]  HPI  Here for wellness and f/u;  Overall doing ok;  Pt denies Chest pain, worsening SOB, DOE, wheezing, orthopnea, PND, worsening LE edema, palpitations, dizziness or syncope.  Pt denies neurological change such as new headache, facial or extremity weakness.  Pt denies polydipsia, polyuria, or low sugar symptoms. Pt states overall good compliance with treatment and medications, good tolerability, and has been trying to follow appropriate diet.  Pt denies worsening depressive symptoms, suicidal ideation or panic. No fever, night sweats, wt loss, loss of appetite, or other constitutional symptoms.  Pt states good ability with ADL's, has low fall risk, home safety reviewed and adequate, no other significant changes in hearing or vision, and only occasionally active with exercise.  No new complaints Past Medical History:  Diagnosis Date  . ALLERGIC RHINITIS 11/06/2009  . Anemia   . ANEMIA-IRON DEFICIENCY 11/20/2009  . Asthma   . Eczema   . Irreducible umbilical hernia 0000000  . Wears glasses    Past Surgical History:  Procedure Laterality Date  . UMBILICAL HERNIA REPAIR  05/13/11    reports that she has never smoked. She has never used smokeless tobacco. She reports that she does not drink alcohol or use drugs. family history includes Diabetes in her father; Hyperlipidemia in her mother; Hypertension in her father. Allergies  Allergen Reactions  . Amoxicillin-Pot Clavulanate     REACTION: rash  . Egg [Eggs Or Egg-Derived Products]   . Peanut-Containing Drug Products   . Shellfish-Derived Products    Current Outpatient Medications on File Prior to Visit  Medication Sig Dispense Refill  . ALAYCEN 1/35 tablet Take 1 tablet by mouth daily.  9  . albuterol (PROAIR HFA) 108 (90 BASE) MCG/ACT inhaler Inhale 2 puffs into the lungs 4 (four) times daily.      Marland Kitchen azelastine (ASTELIN) 0.1 % nasal spray  USE 1 SPRAY IN EACH NOSTRIL TWICE A DAY AS NEEDED  5  . azithromycin (ZITHROMAX Z-PAK) 250 MG tablet 2 tab by mouth day 1, then 1 per day 6 tablet 1  . desoximetasone (TOPICORT) 0.25 % cream Apply topically 2 (two) times daily.      . ferrous sulfate 325 (65 FE) MG tablet Take 325 mg by mouth daily.      . fluticasone (FLONASE) 50 MCG/ACT nasal spray 2 SPRAY IN EACH NOSTRIL ONCE A DAY NASALLY 30 DAY(S)  4  . Fluticasone-Salmeterol (ADVAIR DISKUS) 100-50 MCG/DOSE AEPB Inhale 1 puff into the lungs every 12 (twelve) hours.      Marland Kitchen loratadine (CLARITIN) 10 MG tablet Take 10 mg by mouth daily.      . Multiple Vitamin (MULTIVITAMIN) capsule Take 1 capsule by mouth daily.      . predniSONE (DELTASONE) 10 MG tablet 3 tabs by mouth per day for 3 days,2tabs per day for 3 days,1tab per day for 3 days 18 tablet 0   No current facility-administered medications on file prior to visit.   Review of Systems Constitutional: Negative for other unusual diaphoresis, sweats, appetite or weight changes HENT: Negative for other worsening hearing loss, ear pain, facial swelling, mouth sores or neck stiffness.   Eyes: Negative for other worsening pain, redness or other visual disturbance.  Respiratory: Negative for other stridor or swelling Cardiovascular: Negative for other palpitations or other chest pain  Gastrointestinal: Negative for worsening diarrhea or loose stools, blood in stool,  distention or other pain Genitourinary: Negative for hematuria, flank pain or other change in urine volume.  Musculoskeletal: Negative for myalgias or other joint swelling.  Skin: Negative for other color change, or other wound or worsening drainage.  Neurological: Negative for other syncope or numbness. Hematological: Negative for other adenopathy or swelling Psychiatric/Behavioral: Negative for hallucinations, other worsening agitation, SI, self-injury, or new decreased concentration All otherwise neg per pt     Objective:    Physical Exam BP 126/82   Pulse 88   Temp 98.2 F (36.8 C) (Oral)   Ht 5\' 1"  (1.549 m)   Wt 203 lb (92.1 kg)   SpO2 97%   BMI 38.36 kg/m  VS noted,  Constitutional: Pt is oriented to person, place, and time. Appears well-developed and well-nourished, in no significant distress and comfortable Head: Normocephalic and atraumatic  Eyes: Conjunctivae and EOM are normal. Pupils are equal, round, and reactive to light Right Ear: External ear normal without discharge Left Ear: External ear normal without discharge Nose: Nose without discharge or deformity Mouth/Throat: Oropharynx is without other ulcerations and moist  Neck: Normal range of motion. Neck supple. No JVD present. No tracheal deviation present or significant neck LA or mass Cardiovascular: Normal rate, regular rhythm, normal heart sounds and intact distal pulses.   Pulmonary/Chest: WOB normal and breath sounds without rales or wheezing  Abdominal: Soft. Bowel sounds are normal. NT. No HSM  Musculoskeletal: Normal range of motion. Exhibits no edema Lymphadenopathy: Has no other cervical adenopathy.  Neurological: Pt is alert and oriented to person, place, and time. Pt has normal reflexes. No cranial nerve deficit. Motor grossly intact, Gait intact Skin: Skin is warm and dry. No rash noted or new ulcerations Psychiatric:  Has normal mood and affect. Behavior is normal without agitation + large fibroids palpable to low mid abd All otherwise neg per pt  Lab Results  Component Value Date   WBC 6.3 09/29/2017   HGB 11.7 (L) 09/29/2017   HCT 36.6 09/29/2017   PLT 352.0 09/29/2017   GLUCOSE 102 (H) 11/07/2018   CHOL 183 11/07/2018   TRIG 110.0 11/07/2018   HDL 49.10 11/07/2018   LDLDIRECT 141.6 11/06/2009   LDLCALC 111 (H) 11/07/2018   ALT 10 11/07/2018   AST 12 11/07/2018   NA 137 11/07/2018   K 4.0 11/07/2018   CL 104 11/07/2018   CREATININE 0.82 11/07/2018   BUN 9 11/07/2018   CO2 27 11/07/2018   TSH 1.41 11/07/2018        Assessment & Plan:

## 2019-11-09 NOTE — Patient Instructions (Addendum)
Sorry we are unable for the flu shot today due to being listed as an allergy  Please continue all other medications as before, and refills have been done if requested.  Please have the pharmacy call with any other refills you may need.  Please continue your efforts at being more active, low cholesterol diet, and weight control.  You are otherwise up to date with prevention measures today.  Please keep your appointments with your specialists as you may have planned  Please go to the LAB at the blood drawing area for the tests to be done  You will be contacted by phone if any changes need to be made immediately.  Otherwise, you will receive a letter about your results with an explanation, but please check with MyChart first.  Please remember to sign up for MyChart if you have not done so, as this will be important to you in the future with finding out test results, communicating by private email, and scheduling acute appointments online when needed.  Please make an Appointment to return for your 1 year visit, or sooner if needed, with Lab testing by Appointment as well, to be done about 3-5 days before at the [facility] (so this is for TWO appointments - please see the scheduling desk as you leave)

## 2019-11-10 ENCOUNTER — Encounter: Payer: Self-pay | Admitting: Internal Medicine

## 2019-11-10 NOTE — Assessment & Plan Note (Signed)
Also for iron level

## 2019-11-10 NOTE — Assessment & Plan Note (Signed)
stable overall by history and exam, recent data reviewed with pt, and pt to continue medical treatment as before,  to f/u any worsening symptoms or concerns  

## 2019-11-10 NOTE — Assessment & Plan Note (Signed)

## 2019-11-12 ENCOUNTER — Encounter: Payer: Self-pay | Admitting: Internal Medicine

## 2019-11-12 ENCOUNTER — Other Ambulatory Visit: Payer: Self-pay | Admitting: Internal Medicine

## 2019-11-12 LAB — VITAMIN D 25 HYDROXY (VIT D DEFICIENCY, FRACTURES): VITD: 7.7 ng/mL — ABNORMAL LOW (ref 30.00–100.00)

## 2019-11-12 LAB — VITAMIN B12: Vitamin B-12: 392 pg/mL (ref 211–911)

## 2019-11-12 LAB — TSH: TSH: 0.68 u[IU]/mL (ref 0.35–4.50)

## 2019-11-12 MED ORDER — VITAMIN D (ERGOCALCIFEROL) 1.25 MG (50000 UNIT) PO CAPS: 50000.0000 [IU] | ORAL_CAPSULE | ORAL | 0 refills | Status: DC

## 2019-11-13 ENCOUNTER — Encounter: Payer: Self-pay | Admitting: Internal Medicine

## 2019-11-19 ENCOUNTER — Other Ambulatory Visit: Payer: BC Managed Care – PPO

## 2019-11-19 LAB — URINALYSIS, ROUTINE W REFLEX MICROSCOPIC
Bilirubin Urine: NEGATIVE
Hgb urine dipstick: NEGATIVE
Ketones, ur: NEGATIVE
Leukocytes,Ua: NEGATIVE
Nitrite: NEGATIVE
RBC / HPF: NONE SEEN (ref 0–?)
Specific Gravity, Urine: <=1.005 — AB (ref 1.000–1.030)
Total Protein, Urine: NEGATIVE
Urine Glucose: NEGATIVE
Urobilinogen, UA: 0.2 (ref 0.0–1.0)
pH: 6 (ref 5.0–8.0)

## 2019-11-19 NOTE — Addendum Note (Signed)
Addended by: Trenda Moots on: [id number] 11:13 AM   Modules accepted: Orders

## 2019-11-23 ENCOUNTER — Encounter: Payer: Self-pay | Admitting: Internal Medicine

## 2019-11-23 DIAGNOSIS — J069 Acute upper respiratory infection, unspecified: Secondary | ICD-10-CM

## 2019-11-23 NOTE — Telephone Encounter (Signed)
pleaese to call pt - needs appt for first floor lab test

## 2019-11-23 NOTE — Telephone Encounter (Signed)
Notified pt made lab appt for Tues 11/27/19 per pt request../lmb

## 2019-11-27 ENCOUNTER — Other Ambulatory Visit: Payer: BC Managed Care – PPO

## 2019-11-27 ENCOUNTER — Other Ambulatory Visit: Payer: Self-pay

## 2019-11-27 DIAGNOSIS — J069 Acute upper respiratory infection, unspecified: Secondary | ICD-10-CM

## 2019-11-27 LAB — SARS-COV-2 IGG: SARS-COV-2 IgG: 0.02

## 2020-01-03 ENCOUNTER — Ambulatory Visit: Payer: BC Managed Care – PPO | Attending: Internal Medicine

## 2020-01-03 DIAGNOSIS — Z23 Encounter for immunization: Secondary | ICD-10-CM

## 2020-01-03 NOTE — Progress Notes (Signed)
   Covid-19 Vaccination Clinic  Name:  Sue Beasley    MRN: [id number] DOB: 12-Jan-1971  01/03/2020  [name] was observed post Covid-19 immunization for 15 minutes without incident. She was provided with Vaccine Information Sheet and instruction to access the V-Safe system.   [name] was instructed to call 911 with any severe reactions post vaccine: Marland Kitchen Difficulty breathing  . Swelling of face and throat  . A fast heartbeat  . A bad rash all over body  . Dizziness and weakness   Immunizations Administered    Name Date Dose VIS Date Route   Moderna COVID-19 Vaccine 01/03/2020 10:07 AM 0.5 mL 09/25/2019 Intramuscular   Manufacturer: Moderna   Lot: [id number]   [id number]

## 2020-01-26 ENCOUNTER — Other Ambulatory Visit: Payer: Self-pay | Admitting: Internal Medicine

## 2020-01-28 NOTE — Telephone Encounter (Signed)
Please change to OTC Vitamin D3 at 2000 units per day, indefinitely.  

## 2020-02-05 ENCOUNTER — Ambulatory Visit: Payer: BC Managed Care – PPO | Attending: Family

## 2020-02-05 DIAGNOSIS — Z23 Encounter for immunization: Secondary | ICD-10-CM

## 2020-02-05 NOTE — Progress Notes (Signed)
   Covid-19 Vaccination Clinic  Name:  Sue Beasley    MRN: [id number] DOB: 10-29-1970  02/05/2020  [name] was observed post Covid-19 immunization for 30 minutes based on pre-vaccination screening without incident. She was provided with Vaccine Information Sheet and instruction to access the V-Safe system.   [name] was instructed to call 911 with any severe reactions post vaccine: Marland Kitchen Difficulty breathing  . Swelling of face and throat  . A fast heartbeat  . A bad rash all over body  . Dizziness and weakness   Immunizations Administered    Name Date Dose VIS Date Route   Moderna COVID-19 Vaccine 02/05/2020  9:58 AM 0.5 mL 09/25/2019 Intramuscular   Manufacturer: Moderna   Lot: [id number]   [id number]

## 2020-03-14 ENCOUNTER — Encounter: Payer: Self-pay | Admitting: Internal Medicine

## 2020-03-14 ENCOUNTER — Other Ambulatory Visit: Payer: Self-pay

## 2020-03-14 ENCOUNTER — Ambulatory Visit (INDEPENDENT_AMBULATORY_CARE_PROVIDER_SITE_OTHER): Payer: BC Managed Care – PPO | Admitting: Internal Medicine

## 2020-03-14 DIAGNOSIS — R739 Hyperglycemia, unspecified: Secondary | ICD-10-CM

## 2020-03-14 DIAGNOSIS — J4520 Mild intermittent asthma, uncomplicated: Secondary | ICD-10-CM | POA: Diagnosis not present

## 2020-03-14 DIAGNOSIS — M25532 Pain in left wrist: Secondary | ICD-10-CM

## 2020-03-14 DIAGNOSIS — M25432 Effusion, left wrist: Secondary | ICD-10-CM | POA: Diagnosis not present

## 2020-03-14 MED ORDER — MELOXICAM 15 MG PO TABS
15.0000 mg | ORAL_TABLET | Freq: Every day | ORAL | 2 refills | Status: DC | PRN
Start: 2020-03-14 — End: 2021-11-19

## 2020-03-14 NOTE — Progress Notes (Signed)
Subjective:    Patient ID: Sue Beasley, female    DOB: December 27, 1970, 49 y.o.   MRN: [id number]  HPI  Here to f/u with c/o left post hand wrist and arm pain and swelling for 1 wk ever since she spent a frantic week prior to that on the computer finishing up work for the college semester; now classes finished and successful, but swelling pain persists and wondering what caused.  No trauma,, fever, hx of gout and palm and medial arm are not affected.  Does sleep with left wrist bent most nights but has not been a problem before.  Pt denies chest pain, increased sob or doe, wheezing, orthopnea, PND, increased LE swelling, palpitations, dizziness or syncope.  Pt denies new neurological symptoms such as new headache, or facial or extremity weakness or numbness   Pt denies polydipsia, polyuria Past Medical History:  Diagnosis Date  . ALLERGIC RHINITIS 11/06/2009  . Anemia   . ANEMIA-IRON DEFICIENCY 11/20/2009  . Asthma   . Eczema   . Irreducible umbilical hernia 0000000  . Wears glasses    Past Surgical History:  Procedure Laterality Date  . UMBILICAL HERNIA REPAIR  05/13/11    reports that she has never smoked. She has never used smokeless tobacco. She reports that she does not drink alcohol or use drugs. family history includes Diabetes in her father; Hyperlipidemia in her mother; Hypertension in her father. Allergies  Allergen Reactions  . Amoxicillin-Pot Clavulanate     REACTION: rash  . Egg [Eggs Or Egg-Derived Products]   . Peanut-Containing Drug Products   . Shellfish-Derived Products    Current Outpatient Medications on File Prior to Visit  Medication Sig Dispense Refill  . ALAYCEN 1/35 tablet Take 1 tablet by mouth daily.  9  . albuterol (PROAIR HFA) 108 (90 BASE) MCG/ACT inhaler Inhale 2 puffs into the lungs 4 (four) times daily.      Marland Kitchen azelastine (ASTELIN) 0.1 % nasal spray USE 1 SPRAY IN EACH NOSTRIL TWICE A DAY AS NEEDED  5  . azithromycin (ZITHROMAX Z-PAK) 250 MG tablet 2  tab by mouth day 1, then 1 per day 6 tablet 1  . desoximetasone (TOPICORT) 0.25 % cream Apply topically 2 (two) times daily.      Marland Kitchen EPINEPHrine 0.3 mg/0.3 mL IJ SOAJ injection     . ferrous sulfate 325 (65 FE) MG tablet Take 325 mg by mouth daily.      . fluticasone (FLONASE) 50 MCG/ACT nasal spray 2 SPRAY IN EACH NOSTRIL ONCE A DAY NASALLY 30 DAY(S)  4  . Fluticasone-Salmeterol (ADVAIR DISKUS) 100-50 MCG/DOSE AEPB Inhale 1 puff into the lungs every 12 (twelve) hours.      . Fluticasone-Salmeterol (WIXELA INHUB) 100-50 MCG/DOSE AEPB Wixela Inhub 100 mcg-50 mcg/dose powder for inhalation  TAKE 1 PUFF BY MOUTH TWICE A DAY    . loratadine (CLARITIN) 10 MG tablet Take 10 mg by mouth daily.      . Multiple Vitamin (MULTIVITAMIN) capsule Take 1 capsule by mouth daily.      . predniSONE (DELTASONE) 10 MG tablet 3 tabs by mouth per day for 3 days,2tabs per day for 3 days,1tab per day for 3 days 18 tablet 0  . Vitamin D, Ergocalciferol, (DRISDOL) 1.25 MG (50000 UNIT) CAPS capsule Take 1 capsule (50,000 Units total) by mouth every 7 (seven) days. 12 capsule 0   No current facility-administered medications on file prior to visit.   Review of Systems All otherwise neg per pt  Objective:   Physical Exam BP 120/80 (BP Location: Right Arm, Patient Position: Sitting, Cuff Size: Large)   Pulse 86   Temp 98.3 F (36.8 C) (Oral)   Ht 5\' 1"  (1.549 m)   Wt 208 lb (94.3 kg)   SpO2 99%   BMI 39.30 kg/m  VS noted,  Constitutional: Pt appears in NAD HENT: Head: NCAT.  Right Ear: External ear normal.  Left Ear: External ear normal.  Eyes: . Pupils are equal, round, and reactive to light. Conjunctivae and EOM are normal Nose: without d/c or deformity Neck: Neck supple. Gross normal ROM Cardiovascular: Normal rate and regular rhythm.   Pulmonary/Chest: Effort normal and breath sounds without rales or wheezing.  LUE with non discrete diffuse swelling mild tender to post hand mostly the first 3  compartments extending to the wrist, post arm almost to the lateral epicondylar area, o/w neurovasc intact Neurological: Pt is alert. At baseline orientation, motor grossly intact Skin: Skin is warm. No rashes, other new lesions, no LE edema Psychiatric: Pt behavior is normal without agitation  All otherwise neg per pt Lab Results  Component Value Date   WBC 5.2 11/09/2019   HGB 12.9 11/09/2019   HCT 40.4 11/09/2019   PLT 266.0 11/09/2019   GLUCOSE 103 (H) 11/09/2019   CHOL 181 11/09/2019   TRIG 73.0 11/09/2019   HDL 50.20 11/09/2019   LDLDIRECT 141.6 11/06/2009   LDLCALC 116 (H) 11/09/2019   ALT 19 11/09/2019   AST 16 11/09/2019   NA 137 11/09/2019   K 3.8 11/09/2019   CL 103 11/09/2019   CREATININE 0.73 11/09/2019   BUN 10 11/09/2019   CO2 27 11/09/2019   TSH 0.68 11/09/2019   HGBA1C 5.5 11/09/2019         Assessment & Plan:

## 2020-03-14 NOTE — Patient Instructions (Addendum)
Please take all new medication as prescribed - the anti-inflammatory  Please continue all other medications as before, and refills have been done if requested.  Please have the pharmacy call with any other refills you may need.  Please keep your appointments with your specialists as you may have planned

## 2020-03-15 ENCOUNTER — Encounter: Payer: Self-pay | Admitting: Internal Medicine

## 2020-03-15 NOTE — Assessment & Plan Note (Signed)
stable overall by history and exam, recent data reviewed with pt, and pt to continue medical treatment as before,  to f/u any worsening symptoms or concerns  

## 2020-03-15 NOTE — Assessment & Plan Note (Addendum)
Exam c/w overuse injury mild to mod, for nsaid prn, rest, tylenol, and reassurance - should continue to improve over 2-3 wks, no imaging or other tx needed at this time  I spent 31 minutes in preparing to see the patient by review of recent labs, imaging and procedures, obtaining and reviewing separately obtained history, communicating with the patient and family or caregiver, ordering medications, tests or procedures, and documenting clinical information in the EHR including the differential Dx, treatment, and any further evaluation and other management of left arm/wrist pain/swelling, asthma, hyperglycemia

## 2020-08-29 ENCOUNTER — Other Ambulatory Visit: Payer: Self-pay | Admitting: Internal Medicine

## 2020-08-29 DIAGNOSIS — Z1231 Encounter for screening mammogram for malignant neoplasm of breast: Secondary | ICD-10-CM

## 2020-10-08 ENCOUNTER — Ambulatory Visit
Admission: RE | Admit: 2020-10-08 | Discharge: 2020-10-08 | Disposition: A | Discharge status: Home / Self Care | Payer: BC Managed Care – PPO | Source: Ambulatory Visit | Attending: Internal Medicine | Admitting: Internal Medicine

## 2020-10-08 ENCOUNTER — Other Ambulatory Visit: Payer: Self-pay

## 2020-10-08 DIAGNOSIS — Z1231 Encounter for screening mammogram for malignant neoplasm of breast: Secondary | ICD-10-CM

## 2020-10-09 ENCOUNTER — Ambulatory Visit: Payer: BC Managed Care – PPO | Attending: Family

## 2020-10-09 DIAGNOSIS — Z23 Encounter for immunization: Secondary | ICD-10-CM

## 2020-10-31 ENCOUNTER — Telehealth: Payer: Self-pay | Admitting: Internal Medicine

## 2020-10-31 ENCOUNTER — Other Ambulatory Visit (INDEPENDENT_AMBULATORY_CARE_PROVIDER_SITE_OTHER): Payer: BC Managed Care – PPO

## 2020-10-31 ENCOUNTER — Other Ambulatory Visit: Payer: Self-pay

## 2020-10-31 DIAGNOSIS — R310 Gross hematuria: Secondary | ICD-10-CM | POA: Diagnosis not present

## 2020-10-31 DIAGNOSIS — R350 Frequency of micturition: Secondary | ICD-10-CM

## 2020-10-31 DIAGNOSIS — Z0000 Encounter for general adult medical examination without abnormal findings: Secondary | ICD-10-CM

## 2020-10-31 DIAGNOSIS — R739 Hyperglycemia, unspecified: Secondary | ICD-10-CM

## 2020-10-31 LAB — CBC WITH DIFFERENTIAL/PLATELET
Basophils Absolute: 0.1 10*3/uL (ref 0.0–0.1)
Basophils Relative: 1.1 % (ref 0.0–3.0)
Eosinophils Absolute: 0.2 10*3/uL (ref 0.0–0.7)
Eosinophils Relative: 3.5 % (ref 0.0–5.0)
HCT: 39 % (ref 36.0–46.0)
Hemoglobin: 12.5 g/dL (ref 12.0–15.0)
Lymphocytes Relative: 45.4 % (ref 12.0–46.0)
Lymphs Abs: 2.8 10*3/uL (ref 0.7–4.0)
MCHC: 32.1 g/dL (ref 30.0–36.0)
MCV: 68.6 fl — ABNORMAL LOW (ref 78.0–100.0)
Monocytes Absolute: 0.5 10*3/uL (ref 0.1–1.0)
Monocytes Relative: 9 % (ref 3.0–12.0)
Neutro Abs: 2.5 10*3/uL (ref 1.4–7.7)
Neutrophils Relative %: 41 % — ABNORMAL LOW (ref 43.0–77.0)
Platelets: 351 10*3/uL (ref 150.0–400.0)
RBC: 5.69 Mil/uL — ABNORMAL HIGH (ref 3.87–5.11)
RDW: 15.2 % (ref 11.5–15.5)
WBC: 6.1 10*3/uL (ref 4.0–10.5)

## 2020-10-31 LAB — BASIC METABOLIC PANEL
BUN: 7 mg/dL (ref 6–23)
CO2: 27 mEq/L (ref 19–32)
Calcium: 8.7 mg/dL (ref 8.4–10.5)
Chloride: 102 mEq/L (ref 96–112)
Creatinine, Ser: 0.8 mg/dL (ref 0.40–1.20)
GFR: 86.93 mL/min (ref >60.00)
Glucose, Bld: 87 mg/dL (ref 70–99)
Potassium: 3.9 mEq/L (ref 3.5–5.1)
Sodium: 135 mEq/L (ref 135–145)

## 2020-10-31 LAB — URINALYSIS, ROUTINE W REFLEX MICROSCOPIC
Bilirubin Urine: NEGATIVE
Ketones, ur: NEGATIVE
Leukocytes,Ua: NEGATIVE
Nitrite: NEGATIVE
Specific Gravity, Urine: 1.01 (ref 1.000–1.030)
Total Protein, Urine: 30 — AB
Urine Glucose: NEGATIVE
Urobilinogen, UA: 0.2 (ref 0.0–1.0)
pH: 6 (ref 5.0–8.0)

## 2020-10-31 LAB — HEPATIC FUNCTION PANEL
ALT: 12 U/L (ref 0–35)
AST: 14 U/L (ref 0–37)
Albumin: 4.1 g/dL (ref 3.5–5.2)
Alkaline Phosphatase: 76 U/L (ref 39–117)
Bilirubin, Direct: 0.1 mg/dL (ref 0.0–0.3)
Total Bilirubin: 0.4 mg/dL (ref 0.2–1.2)
Total Protein: 7.9 g/dL (ref 6.0–8.3)

## 2020-10-31 LAB — LIPID PANEL
Cholesterol: 215 mg/dL — ABNORMAL HIGH (ref 0–200)
HDL: 51.1 mg/dL (ref >39.00)
LDL Cholesterol: 139 mg/dL — ABNORMAL HIGH (ref 0–99)
NonHDL: 164.08
Total CHOL/HDL Ratio: 4
Triglycerides: 125 mg/dL (ref 0.0–149.0)
VLDL: 25 mg/dL (ref 0.0–40.0)

## 2020-10-31 LAB — TSH: TSH: 1.52 u[IU]/mL (ref 0.35–4.50)

## 2020-10-31 LAB — HEMOGLOBIN A1C: Hgb A1c MFr Bld: 5.9 % (ref 4.6–6.5)

## 2020-10-31 NOTE — Telephone Encounter (Signed)
Pt informed

## 2020-10-31 NOTE — Telephone Encounter (Signed)
° °  Patient calling to report frequent urination, blood in urine Can urine test be ordered?

## 2020-10-31 NOTE — Addendum Note (Signed)
Addended by: Raliegh Ip on: 10/31/2020 03:04 PM   Modules accepted: Orders

## 2020-10-31 NOTE — Addendum Note (Signed)
Addended by: KING, Portia Wisdom M on: 10/31/2020 03:04 PM   Modules accepted: Orders  

## 2020-10-31 NOTE — Telephone Encounter (Signed)
Sent to Dr. John. 

## 2020-10-31 NOTE — Addendum Note (Signed)
Addended by: Raliegh Ip on: 10/31/2020 03:03 PM   Modules accepted: Orders

## 2020-10-31 NOTE — Telephone Encounter (Signed)
[place] lab is ordered; please let pt know

## 2020-11-01 ENCOUNTER — Encounter: Payer: Self-pay | Admitting: Internal Medicine

## 2020-11-01 LAB — URINE CULTURE

## 2020-11-02 ENCOUNTER — Encounter: Payer: Self-pay | Admitting: Internal Medicine

## 2020-11-04 ENCOUNTER — Other Ambulatory Visit: Payer: BC Managed Care – PPO

## 2020-11-11 ENCOUNTER — Encounter: Payer: BC Managed Care – PPO | Admitting: Internal Medicine

## 2020-11-18 ENCOUNTER — Encounter: Payer: Self-pay | Admitting: Internal Medicine

## 2020-11-18 ENCOUNTER — Other Ambulatory Visit: Payer: Self-pay

## 2020-11-18 ENCOUNTER — Ambulatory Visit (INDEPENDENT_AMBULATORY_CARE_PROVIDER_SITE_OTHER): Payer: BC Managed Care – PPO | Admitting: Internal Medicine

## 2020-11-18 VITALS — BP 112/74 | HR 61 | Temp 98.1°F | Ht 61.0 in | Wt 208.0 lb

## 2020-11-18 DIAGNOSIS — Z0000 Encounter for general adult medical examination without abnormal findings: Secondary | ICD-10-CM

## 2020-11-18 DIAGNOSIS — Z23 Encounter for immunization: Secondary | ICD-10-CM

## 2020-11-18 DIAGNOSIS — R718 Other abnormality of red blood cells: Secondary | ICD-10-CM | POA: Diagnosis not present

## 2020-11-18 DIAGNOSIS — D509 Iron deficiency anemia, unspecified: Secondary | ICD-10-CM

## 2020-11-18 DIAGNOSIS — D582 Other hemoglobinopathies: Secondary | ICD-10-CM

## 2020-11-18 NOTE — Progress Notes (Signed)
Established Patient Office Visit  Subjective:  Patient ID: Sue Beasley, female    DOB: October 03, 1971  Age: 50 y.o. MRN: [id number]        Chief Complaint:: wellness exam and f/u iron deficiency, and low mcv       HPI:  Sue Beasley is a 49 y.o. female here for wellness exam, due for colonoscopy, hep c check, flu shot/    Denies worsening reflux, abd pain, dysphagia, n/v, bowel change or blood.  No overt bleeding.  Pt denies chest pain, increased sob or doe, wheezing, orthopnea, PND, increased LE swelling, palpitations, dizziness or syncope.   Wt Readings from Last 3 Encounters:  11/18/20 208 lb (94.3 kg)  03/14/20 208 lb (94.3 kg)  11/09/19 203 lb (92.1 kg)   BP Readings from Last 3 Encounters:  11/18/20 112/74  03/14/20 120/80  11/09/19 126/82   Immunization History  Administered Date(s) Administered  . Influenza Inj Mdck Quad Pf 11/18/2020  . Influenza, High Dose Seasonal PF 11/07/2019  . Influenza, Quadrivalent, Recombinant, Inj, Pf 11/09/2019  . Moderna Sars-Covid-2 Vaccination 01/03/2020, 02/05/2020, 10/09/2020  . Td 10/25/2000  . Tdap 09/02/2011   Health Maintenance Due  Topic Date Due  . COLONOSCOPY (Pts 45-70yrs Insurance coverage will need to be confirmed)  Never done  . PAP SMEAR-Modifier  10/13/2020         Past Medical History:  Diagnosis Date  . ALLERGIC RHINITIS 11/06/2009  . Anemia   . ANEMIA-IRON DEFICIENCY 11/20/2009  . Asthma   . Eczema   . Irreducible umbilical hernia 12/01/2534  . Wears glasses    Past Surgical History:  Procedure Laterality Date  . UMBILICAL HERNIA REPAIR  05/13/11    reports that she has never smoked. She has never used smokeless tobacco. She reports that she does not drink alcohol and does not use drugs. family history includes Diabetes in her father; Hyperlipidemia in her mother; Hypertension in her father. Allergies  Allergen Reactions  . Amoxicillin-Pot Clavulanate     REACTION: rash  . Egg [Eggs Or Egg-Derived  Products]   . Peanut-Containing Drug Products   . Shellfish-Derived Products    Current Outpatient Medications on File Prior to Visit  Medication Sig Dispense Refill  . ALAYCEN 1/35 tablet Take 1 tablet by mouth daily.  9  . albuterol (VENTOLIN HFA) 108 (90 Base) MCG/ACT inhaler Inhale 2 puffs into the lungs 4 (four) times daily.    Marland Kitchen azelastine (ASTELIN) 0.1 % nasal spray USE 1 SPRAY IN EACH NOSTRIL TWICE A DAY AS NEEDED  5  . Cholecalciferol (VITAMIN D) 50 MCG (2000 UT) tablet 1 tablet    . desoximetasone (TOPICORT) 0.25 % cream Apply topically 2 (two) times daily.    Marland Kitchen EPINEPHrine 0.3 mg/0.3 mL IJ SOAJ injection     . ferrous sulfate 325 (65 FE) MG tablet Take 325 mg by mouth daily.    . fluticasone (FLONASE) 50 MCG/ACT nasal spray 2 SPRAY IN EACH NOSTRIL ONCE A DAY NASALLY 30 DAY(S)  4  . Fluticasone-Salmeterol (ADVAIR) 100-50 MCG/DOSE AEPB Inhale 1 puff into the lungs every 12 (twelve) hours.    . Fluticasone-Salmeterol (ADVAIR) 100-50 MCG/DOSE AEPB Wixela Inhub 100 mcg-50 mcg/dose powder for inhalation  TAKE 1 PUFF BY MOUTH TWICE A DAY    . loratadine (CLARITIN) 10 MG tablet Take 10 mg by mouth daily.    . meloxicam (MOBIC) 15 MG tablet Take 1 tablet (15 mg total) by mouth daily as needed for pain. [place]  tablet 2  . Multiple Vitamin (MULTIVITAMIN) capsule Take 1 capsule by mouth daily.    Marland Kitchen azithromycin (ZITHROMAX Z-PAK) 250 MG tablet 2 tab by mouth day 1, then 1 per day (Patient not taking: Reported on 11/18/2020) 6 tablet 1  . dorzolamide-timolol (COSOPT) 22.3-6.8 MG/ML ophthalmic solution SMARTSIG:In Eye(s)    . latanoprost (XALATAN) 0.005 % ophthalmic solution SMARTSIG:In Eye(s)    . predniSONE (DELTASONE) 10 MG tablet 3 tabs by mouth per day for 3 days,2tabs per day for 3 days,1tab per day for 3 days (Patient not taking: Reported on 11/18/2020) 18 tablet 0  . Vitamin D, Ergocalciferol, (DRISDOL) 1.25 MG (50000 UNIT) CAPS capsule Take 1 capsule (50,000 Units total) by mouth every 7  (seven) days. (Patient not taking: Reported on 11/18/2020) 12 capsule 0   No current facility-administered medications on file prior to visit.        ROS:  All others reviewed and negative.  Objective        PE:  BP 112/74   Pulse 61   Temp 98.1 F (36.7 C) (Temporal)   Ht 5\' 1"  (1.549 m)   Wt 208 lb (94.3 kg)   SpO2 99%   BMI 39.30 kg/m                 Constitutional: Pt appears in NAD               HENT: Head: NCAT.                Right Ear: External ear normal.                 Left Ear: External ear normal.                Eyes: . Pupils are equal, round, and reactive to light. Conjunctivae and EOM are normal               Nose: without d/c or deformity               Neck: Neck supple. Gross normal ROM               Cardiovascular: Normal rate and regular rhythm.                 Pulmonary/Chest: Effort normal and breath sounds without rales or wheezing.                Abd:  Soft, NT, ND, + BS, no organomegaly but with large uterine fibroid palpable nontender               Neurological: Pt is alert. At baseline orientation, motor grossly intact               Skin: Skin is warm. No rashes, no other new lesions, LE edema - none               Psychiatric: Pt behavior is normal without agitation   Assessment/Plan:  Sue Beasley is a 50 y.o. Black or African American [2] female with  has a past medical history of ALLERGIC RHINITIS (11/06/2009), Anemia, ANEMIA-IRON DEFICIENCY (11/20/2009), Asthma, Eczema, Irreducible umbilical hernia (0000000), and Wears glasses.  Micro: none  Cardiac tracings I have personally interpreted today:  none  Pertinent Radiological findings (summarize): none   Lab Results  Component Value Date   WBC 5.0 11/20/2020   HGB 11.1 (L) 11/20/2020   HCT 34.7 (L) 11/20/2020   PLT 312.0 11/20/2020   GLUCOSE  87 10/31/2020   CHOL 215 (H) 10/31/2020   TRIG 125.0 10/31/2020   HDL 51.10 10/31/2020   LDLDIRECT 141.6 11/06/2009   LDLCALC 139 (H) 10/31/2020    ALT 12 10/31/2020   AST 14 10/31/2020   NA 135 10/31/2020   K 3.9 10/31/2020   CL 102 10/31/2020   CREATININE 0.80 10/31/2020   BUN 7 10/31/2020   CO2 27 10/31/2020   TSH 1.52 10/31/2020   HGBA1C 5.9 10/31/2020       Assessment & Plan:   Problem List Items Addressed This Visit      High   Preventative health care    Age and sex appropriate education and counseling updated with regular exercise and diet Referrals for preventative services - colonoscopy Immunizations addressed - flu shot Smoking counseling  - none needed Evidence for depression or other mood disorder - none significant Most recent labs reviewed. 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   Follow-up: Return in about 1 year (around 11/18/2021).    Cathlean Cower, MD 11/23/2020 1:10 PM [facility]

## 2020-11-18 NOTE — Patient Instructions (Addendum)
You had the flu shot today  You will be contacted regarding the referral for: colonoscopy  Please go to the LAB at the [place] site for the testing we discussed as you have time  Please continue all other medications as before, and refills have been done if requested.  Please have the pharmacy call with any other refills you may need.  Please continue your efforts at being more active, low cholesterol diet, and weight control.  You are otherwise up to date with prevention measures today.  Please keep your appointments with your specialists as you may have planned - GYN for the fibroid  Please make an Appointment to return for your 1 year visit, or sooner if needed, with Lab testing by Appointment as well, to be done about 3-5 days before at the [place] (so this is for TWO appointments - please see the scheduling desk as you leave)  Due to the ongoing Covid 19 pandemic, our lab now requires an appointment for any labs done at our office.  If you need labs done and do not have an appointment, please call our office ahead of time to schedule before presenting to the lab for your testing.

## 2020-11-20 ENCOUNTER — Other Ambulatory Visit (INDEPENDENT_AMBULATORY_CARE_PROVIDER_SITE_OTHER): Payer: BC Managed Care – PPO

## 2020-11-20 DIAGNOSIS — R718 Other abnormality of red blood cells: Secondary | ICD-10-CM | POA: Diagnosis not present

## 2020-11-20 DIAGNOSIS — D582 Other hemoglobinopathies: Secondary | ICD-10-CM | POA: Diagnosis not present

## 2020-11-20 LAB — CBC WITH DIFFERENTIAL/PLATELET
Basophils Absolute: 0.1 10*3/uL (ref 0.0–0.1)
Basophils Relative: 1.5 % (ref 0.0–3.0)
Eosinophils Absolute: 0.3 10*3/uL (ref 0.0–0.7)
Eosinophils Relative: 6.9 % — ABNORMAL HIGH (ref 0.0–5.0)
HCT: 34.7 % — ABNORMAL LOW (ref 36.0–46.0)
Hemoglobin: 11.1 g/dL — ABNORMAL LOW (ref 12.0–15.0)
Lymphocytes Relative: 44.3 % (ref 12.0–46.0)
Lymphs Abs: 2.2 10*3/uL (ref 0.7–4.0)
MCHC: 31.9 g/dL (ref 30.0–36.0)
MCV: 68 fl — ABNORMAL LOW (ref 78.0–100.0)
Monocytes Absolute: 0.4 10*3/uL (ref 0.1–1.0)
Monocytes Relative: 8.8 % (ref 3.0–12.0)
Neutro Abs: 1.9 10*3/uL (ref 1.4–7.7)
Neutrophils Relative %: 38.5 % — ABNORMAL LOW (ref 43.0–77.0)
Platelets: 312 10*3/uL (ref 150.0–400.0)
RBC: 5.1 Mil/uL (ref 3.87–5.11)
RDW: 15.8 % — ABNORMAL HIGH (ref 11.5–15.5)
WBC: 5 10*3/uL (ref 4.0–10.5)

## 2020-11-20 LAB — IBC PANEL
Iron: 31 ug/dL — ABNORMAL LOW (ref 42–145)
Saturation Ratios: 5.6 % — ABNORMAL LOW (ref 20.0–50.0)
Transferrin: 395 mg/dL — ABNORMAL HIGH (ref 212.0–360.0)

## 2020-11-20 LAB — FERRITIN: Ferritin: 6.6 ng/mL — ABNORMAL LOW (ref 10.0–291.0)

## 2020-11-21 ENCOUNTER — Encounter: Payer: Self-pay | Admitting: Internal Medicine

## 2020-11-23 ENCOUNTER — Encounter: Payer: Self-pay | Admitting: Internal Medicine

## 2020-11-23 DIAGNOSIS — R718 Other abnormality of red blood cells: Secondary | ICD-10-CM | POA: Insufficient documentation

## 2020-11-23 NOTE — Assessment & Plan Note (Signed)
Also for iron lab f/u 

## 2020-11-23 NOTE — Assessment & Plan Note (Signed)
Age and sex appropriate education and counseling updated with regular exercise and diet Referrals for preventative services - colonoscopy Immunizations addressed - flu shot Smoking counseling  - none needed Evidence for depression or other mood disorder - none significant Most recent labs reviewed. I have personally reviewed and have noted: 1) the patient's medical and social history 2) The patient's current medications and supplements 3) The patient's height, weight, and BMI have been recorded in the chart

## 2020-11-23 NOTE — Assessment & Plan Note (Signed)
Also for hgb electrophoresis

## 2020-11-25 LAB — HEMOGLOBINOPATHY EVALUATION
Fetal Hemoglobin Testing: <1 % (ref 0.0–1.9)
HCT: 36.7 % (ref 35.0–45.0)
Hemoglobin A2 - HGBRFX: 2.2 % (ref 2.2–3.2)
Hemoglobin: 10.9 g/dL — ABNORMAL LOW (ref 11.7–15.5)
Hgb A: 97.8 % (ref >96.0)
MCH: 21.5 pg — ABNORMAL LOW (ref 27.0–33.0)
MCV: 72.5 fL — ABNORMAL LOW (ref 80.0–100.0)
RBC: 5.06 10*6/uL (ref 3.80–5.10)
RDW: 15.3 % — ABNORMAL HIGH (ref 11.0–15.0)

## 2020-11-25 LAB — HEPATITIS C ANTIBODY
Hepatitis C Ab: NONREACTIVE
SIGNAL TO CUT-OFF: 0.01 (ref <1.00)

## 2020-12-12 ENCOUNTER — Telehealth: Payer: Self-pay | Admitting: *Deleted

## 2020-12-12 ENCOUNTER — Ambulatory Visit (AMBULATORY_SURGERY_CENTER): Payer: Self-pay | Admitting: *Deleted

## 2020-12-12 VITALS — Ht 61.0 in | Wt 195.0 lb

## 2020-12-12 DIAGNOSIS — Z1211 Encounter for screening for malignant neoplasm of colon: Secondary | ICD-10-CM

## 2020-12-12 MED ORDER — PLENVU 140 G PO SOLR: 1.0000 | Freq: Once | ORAL | 0 refills | Status: AC

## 2020-12-12 NOTE — Telephone Encounter (Signed)
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  Thanks!

## 2020-12-12 NOTE — Telephone Encounter (Signed)
Inbound call from patient requesting a call back between 12-1pm.

## 2020-12-12 NOTE — Progress Notes (Addendum)
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  No issues with past sedation with any surgeries or procedures No intubation problems in the past  No FH of Malignant Hyperthermia No diet pills per patient No home 02 use per patient  No blood thinners per patient  Pt denies issues with constipation  No A fib or A flutter  EMMI video to pt or via [place] 19 guidelines implemented in PV today with Pt and RN  Pt is fully vaccinated  for Covid  Pt denies loose or missing teeth, denies dentures, partials, dental implants, capped or bonded teeth  Virtual previsit completed.  Instructions through MyChart and mailed   Code to Pharmacy and mailed coupon with instructions., and  NO PA's for preps discussed with pt In PV today  Discussed with pt there will be an out-of-pocket cost for prep and that varies from $0 to 70 dollars   Due to the COVID-19 pandemic we are asking patients to follow certain guidelines.  Pt aware of COVID protocols and [facility] guidelines

## 2020-12-12 NOTE — Telephone Encounter (Signed)
Virtual pre-visit attempted.  No answer. Left voicemail to call back today  Between 12-1  room 52 or call and reschedule by 5 pm today to avoid cancellation of up-coming colonoscopy.

## 2020-12-12 NOTE — Progress Notes (Signed)
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

## 2020-12-12 NOTE — Telephone Encounter (Signed)
Virtual pre visit completed.  

## 2020-12-12 NOTE — Telephone Encounter (Signed)
Raquel Sarna,  Thanks for the heads up  [employer]

## 2020-12-19 DIAGNOSIS — R32 Unspecified urinary incontinence: Secondary | ICD-10-CM | POA: Insufficient documentation

## 2020-12-24 ENCOUNTER — Encounter: Payer: Self-pay | Admitting: Gastroenterology

## 2020-12-31 ENCOUNTER — Encounter: Payer: Self-pay | Admitting: Gastroenterology

## 2020-12-31 ENCOUNTER — Ambulatory Visit (AMBULATORY_SURGERY_CENTER): Payer: BC Managed Care – PPO | Admitting: Gastroenterology

## 2020-12-31 ENCOUNTER — Other Ambulatory Visit: Payer: Self-pay

## 2020-12-31 VITALS — BP 130/70 | HR 82 | Temp 96.6°F | Resp 15 | Ht 61.0 in | Wt 195.0 lb

## 2020-12-31 DIAGNOSIS — Z1211 Encounter for screening for malignant neoplasm of colon: Secondary | ICD-10-CM | POA: Diagnosis present

## 2020-12-31 DIAGNOSIS — D123 Benign neoplasm of transverse colon: Secondary | ICD-10-CM

## 2020-12-31 DIAGNOSIS — K635 Polyp of colon: Secondary | ICD-10-CM

## 2020-12-31 MED ORDER — SODIUM CHLORIDE 0.9 % IV SOLN: 500.0000 mL | INTRAVENOUS | Status: DC

## 2020-12-31 NOTE — Op Note (Signed)
[facility] Patient Name: Sue Beasley Procedure Date: 12/31/2020 1:56 PM MRN: [id number] Endoscopist: Thornton Park MD, MD Age: 50 Referring MD:  Date of Birth: 03-04-1971 Gender: Female Account #: [id number] Procedure:                Colonoscopy Indications:              Screening for colorectal malignant neoplasm, This                            is the patient's first colonoscopy                           No known family history of colon cancer or polyps Medicines:                Monitored Anesthesia Care Procedure:                Pre-Anesthesia Assessment:                           - Prior to the procedure, a History and Physical                            was performed, and patient medications and                            allergies were reviewed. The patient's tolerance of                            previous anesthesia was also reviewed. The risks                            and benefits of the procedure and the sedation                            options and risks were discussed with the patient.                            All questions were answered, and informed consent                            was obtained. Prior Anticoagulants: The patient has                            taken no previous anticoagulant or antiplatelet                            agents. ASA Grade Assessment: II - A patient with                            mild systemic disease. After reviewing the risks                            and benefits, the patient was deemed in  satisfactory condition to undergo the procedure.                           After obtaining informed consent, the colonoscope                            was passed under direct vision. Throughout the                            procedure, the patient's blood pressure, pulse, and                            oxygen saturations were monitored continuously. The                            Colonoscope was introduced  through the anus and                            advanced to the the cecum, identified by                            appendiceal orifice and ileocecal valve. A second                            forward view of the right colon was performed. The                            colonoscopy was performed with moderate difficulty                            due to restricted mobility of the colon,                            significant looping and a tortuous colon.                            Successful completion of the procedure was aided by                            applying abdominal pressure. The patient tolerated                            the procedure well. The quality of the bowel                            preparation was excellent. The ileocecal valve,                            appendiceal orifice, and rectum were photographed. Scope In: 2:05:13 PM Scope Out: 2:19:07 PM Scope Withdrawal Time: 0 hours 10 minutes 41 seconds  Total Procedure Duration: 0 hours 13 minutes 54 seconds  Findings:                 The perianal and digital rectal examinations were  normal.                           A 2 mm polyp was found in the hepatic flexure. The                            polyp was sessile. The polyp was removed with a                            cold snare. Resection and retrieval were complete.                            Estimated blood loss was minimal.                           Narrowing of the colon lumen is found in the are of                            the distal sigmoid colon. No mucosal abnormalities                            seen in this area.                           The exam was otherwise without abnormality on                            direct and retroflexion views. Complications:            No immediate complications. Estimated blood loss:                            Minimal. Estimated Blood Loss:     Estimated blood loss was minimal. Impression:                - One 2 mm polyp at the hepatic flexure, removed                            with a cold snare. Resected and retrieved.                           - Narrowing in the distal sigmoid colon - ?                            external compression. This is of unclear clinical                            significance.                           - The examination was otherwise normal on direct                            and retroflexion views. Recommendation:           - Patient has a  contact number available for                            emergencies. The signs and symptoms of potential                            delayed complications were discussed with the                            patient. Return to normal activities tomorrow.                            Written discharge instructions were provided to the                            patient.                           - Resume previous diet.                           - Continue present medications.                           - Await pathology results.                           - Repeat colonoscopy date to be determined after                            pending pathology results are reviewed for                            surveillance.                           - Follow-up with your GYN for evaluation of                            possible extrinisic compression on the distal                            sigmoid colon. If that evaluation is negative,                            would recommend CT abd/pelvis with contrast.                           - Emerging evidence supports eating a diet of                            fruits, vegetables, grains, calcium, and yogurt                            while reducing red meat and alcohol may reduce the  risk of colon cancer.                           - Thank you for allowing me to be involved in your                            colon cancer prevention. Thornton Park MD,  MD 12/31/2020 2:25:55 PM This report has been signed electronically.

## 2020-12-31 NOTE — Progress Notes (Signed)
[facility] [place] 

## 2020-12-31 NOTE — Progress Notes (Signed)
Called to room to assist during endoscopic procedure.  Patient ID and intended procedure confirmed with present staff. Received instructions for my participation in the procedure from the performing physician.  

## 2020-12-31 NOTE — Patient Instructions (Signed)
Handout given:  Polyps Resume previous diet Continue current medications Await pathology results  YOU HAD AN ENDOSCOPIC PROCEDURE TODAY AT THE [facility]:   Refer to the procedure report that was given to you for any specific questions about what was found during the examination.  If the procedure report does not answer your questions, please call your gastroenterologist to clarify.  If you requested that your care partner not be given the details of your procedure findings, then the procedure report has been included in a sealed envelope for you to review at your convenience later.  YOU SHOULD EXPECT: Some feelings of bloating in the abdomen. Passage of more gas than usual.  Walking can help get rid of the air that was put into your GI tract during the procedure and reduce the bloating. If you had a lower endoscopy (such as a colonoscopy or flexible sigmoidoscopy) you may notice spotting of blood in your stool or on the toilet paper. If you underwent a bowel prep for your procedure, you may not have a normal bowel movement for a few days.  Please Note:  You might notice some irritation and congestion in your nose or some drainage.  This is from the oxygen used during your procedure.  There is no need for concern and it should clear up in a day or so.  SYMPTOMS TO REPORT IMMEDIATELY:   Following lower endoscopy (colonoscopy or flexible sigmoidoscopy):  Excessive amounts of blood in the stool  Significant tenderness or worsening of abdominal pains  Swelling of the abdomen that is new, acute  Fever of 100F or higher  For urgent or emergent issues, a gastroenterologist can be reached at any hour by calling [phone]. Do not use MyChart messaging for urgent concerns.    DIET:  We do recommend a small meal at first, but then you may proceed to your regular diet.  Drink plenty of fluids but you should avoid alcoholic beverages for 24 hours.  ACTIVITY:  You should plan to take  it easy for the rest of today and you should NOT DRIVE or use heavy machinery until tomorrow (because of the sedation medicines used during the test).    FOLLOW UP: Our staff will call the number listed on your records 48-72 hours following your procedure to check on you and address any questions or concerns that you may have regarding the information given to you following your procedure. If we do not reach you, we will leave a message.  We will attempt to reach you two times.  During this call, we will ask if you have developed any symptoms of COVID 19. If you develop any symptoms (ie: fever, flu-like symptoms, shortness of breath, cough etc.) before then, please call [phone].  If you test positive for Covid 19 in the 2 weeks post procedure, please call and report this information to us.    If any biopsies were taken you will be contacted by phone or by letter within the next 1-3 weeks.  Please call us at [phone] if you have not heard about the biopsies in 3 weeks.   SIGNATURES/CONFIDENTIALITY: You and/or your care partner have signed paperwork which will be entered into your electronic medical record.  These signatures attest to the fact that that the information above on your After Visit Summary has been reviewed and is understood.  Full responsibility of the confidentiality of this discharge information lies with you and/or your care-partner. 

## 2020-12-31 NOTE — Progress Notes (Signed)
Report to PACU, RN, vss, BBS= Clear.  

## 2021-01-02 ENCOUNTER — Telehealth: Payer: Self-pay

## 2021-01-02 NOTE — Telephone Encounter (Signed)
  Follow up Call-  Call back number 12/31/2020  Post procedure Call Back phone  # [phone]  Permission to leave phone message Yes  Some recent data might be hidden     Patient questions:  Do you have a fever, pain , or abdominal swelling? No. Pain Score  0 *  Have you tolerated food without any problems? Yes.    Have you been able to return to your normal activities? Yes.    Do you have any questions about your discharge instructions: Diet   No. Medications  No. Follow up visit  No.  Do you have questions or concerns about your Care? No.  Actions: * If pain score is 4 or above: No action needed, pain <4.  1. Have you developed a fever since your procedure? no  2.   Have you had an respiratory symptoms (SOB or cough) since your procedure? no  3.   Have you tested positive for COVID 19 since your procedure no  4.   Have you had any family members/close contacts diagnosed with the COVID 19 since your procedure?  no   If yes to any of these questions please route to Joylene John, RN and Joella Prince, RN

## 2021-01-06 ENCOUNTER — Encounter: Payer: Self-pay | Admitting: Gastroenterology

## 2021-02-11 NOTE — Progress Notes (Signed)
   Covid-19 Vaccination Clinic  Name:  Sue Beasley    MRN: [id number] DOB: [birth year]  02/11/2021  Sue Beasley was observed post Covid-19 immunization for 15 minutes without incident. She was provided with Vaccine Information Sheet and instruction to access the V-Safe system.   Sue Beasley was instructed to call 911 with any severe reactions post vaccine: Marland Kitchen Difficulty breathing  . Swelling of face and throat  . A fast heartbeat  . A bad rash all over body  . Dizziness and weakness   Immunizations Administered    Name Date Dose VIS Date Route   Moderna COVID-19 Vaccine 10/09/2020 -- -- --   Lot: [id number]   Moderna Covid-19 Booster Vaccine 10/09/2020  8:00 AM 0.25 mL 08/13/2020 Intramuscular   Manufacturer: Moderna   Lot: [id number]   [place]: 75301-040-45

## 2021-06-13 IMAGING — MG DIGITAL SCREENING BILAT W/ TOMO W/ CAD
8 series · 8 of 24 positions shown · non-contrast
Comparison: Previous exam(s).

CLINICAL DATA: Screening.

EXAM:
DIGITAL SCREENING BILATERAL MAMMOGRAM WITH TOMO AND CAD

[R MLO synth-2D]
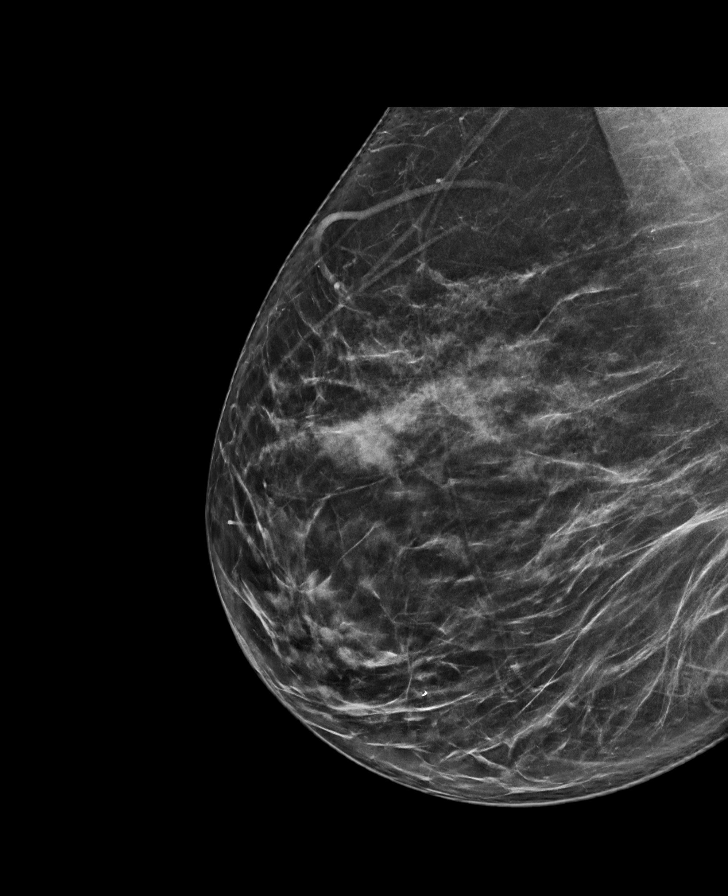

[R CC synth-2D]
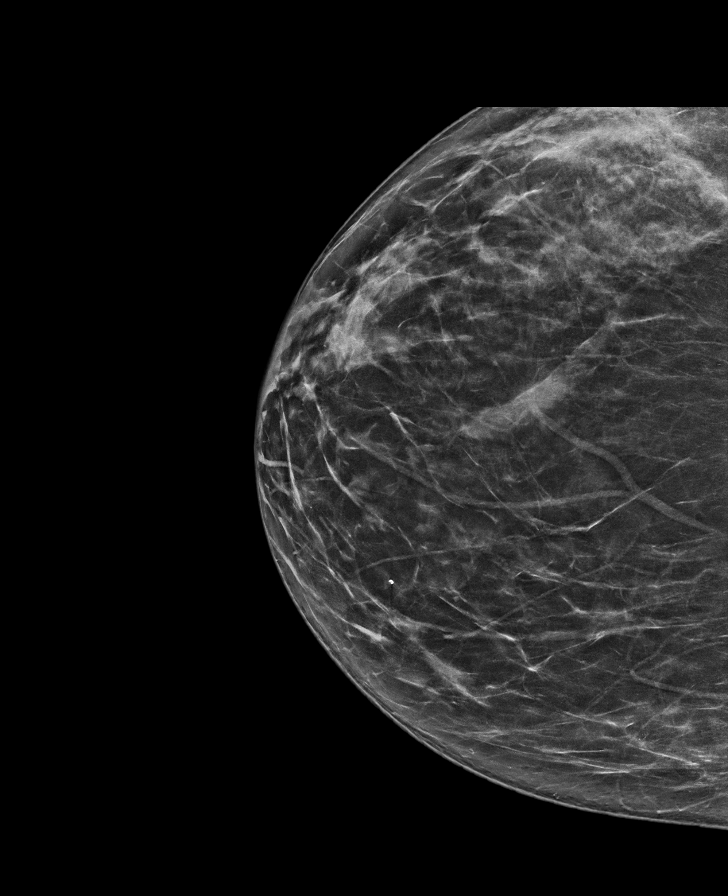

[L MLO synth-2D]
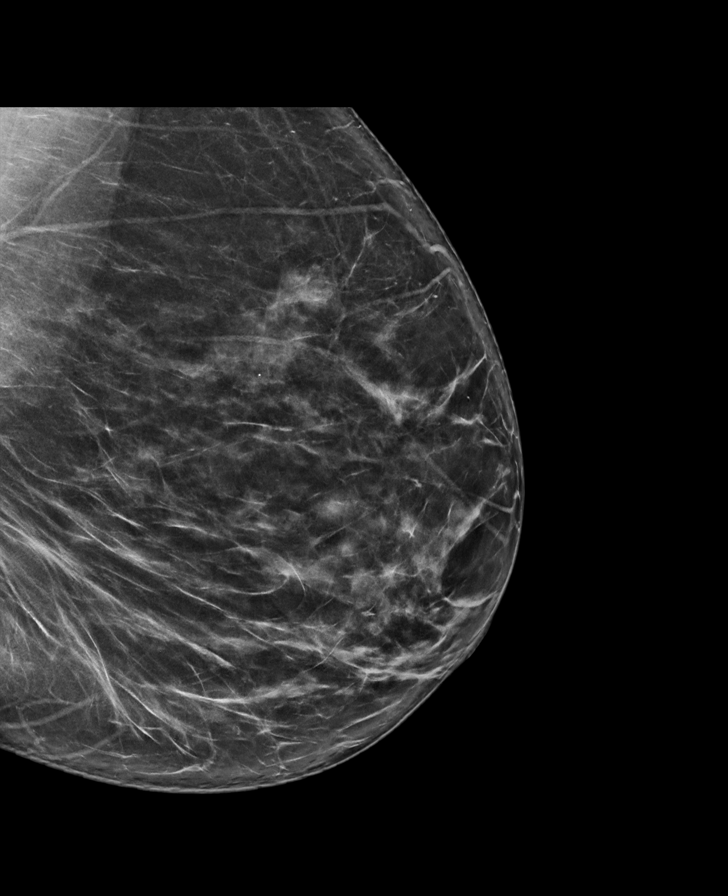

[L CC synth-2D]
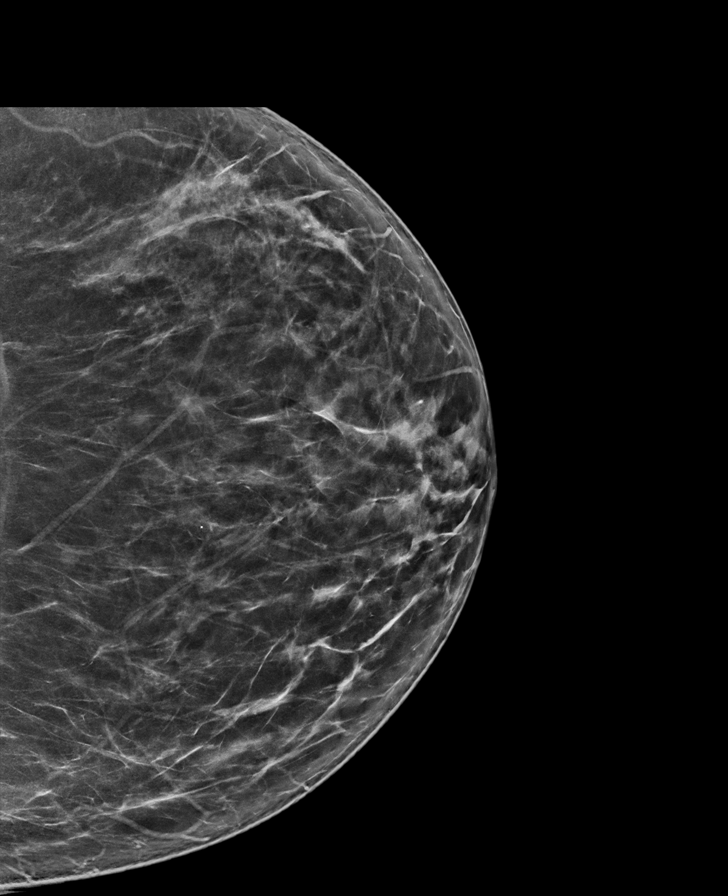

[R CC tomo · tomo slice 39/78.0]
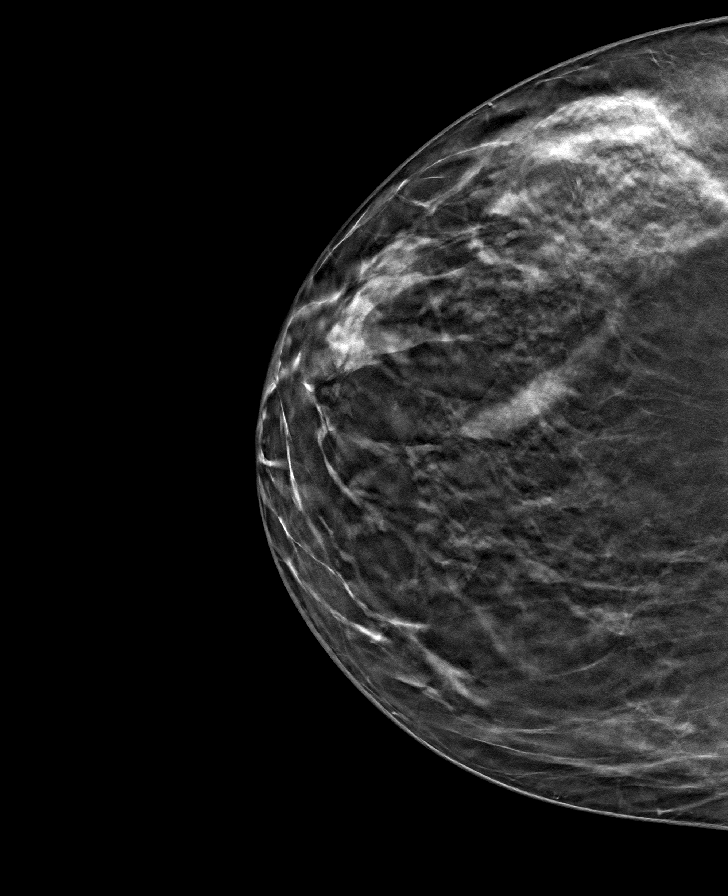

[L CC tomo · tomo slice 43/84.0]
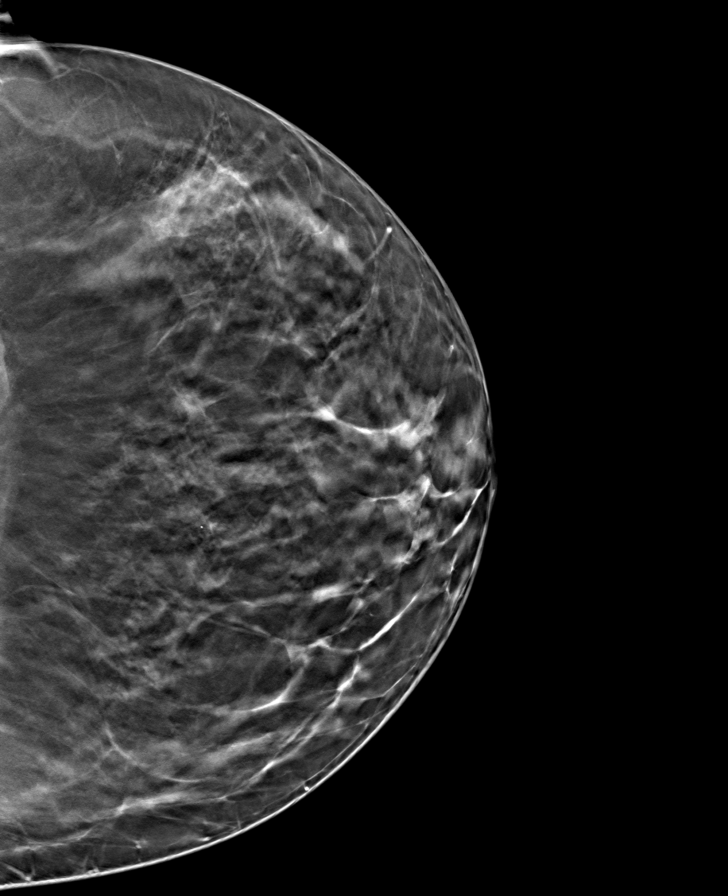

[L MLO tomo · tomo slice 44/87.0]
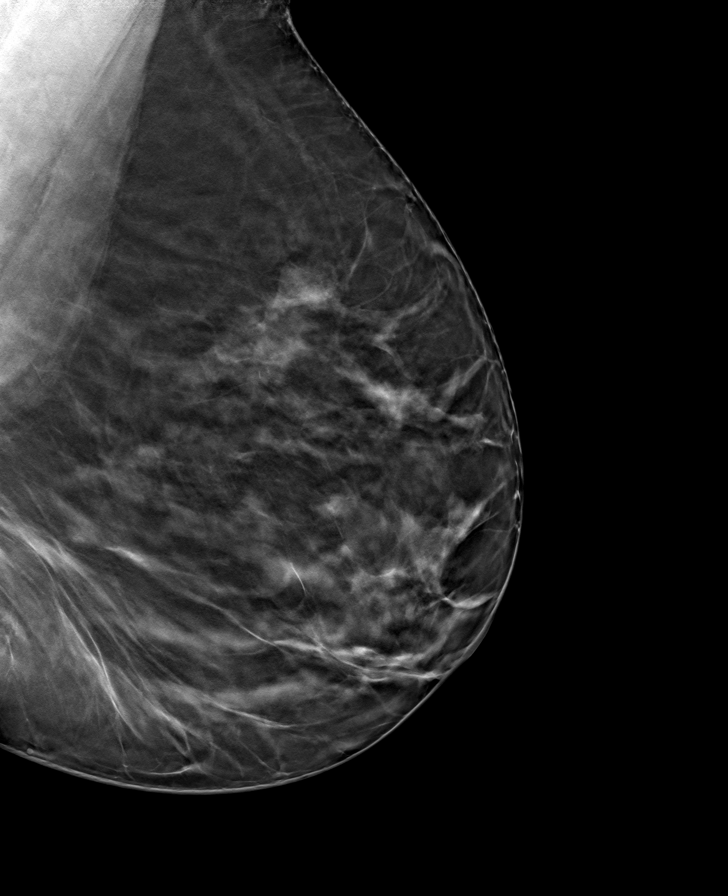

[R MLO tomo · tomo slice 42/83.0]
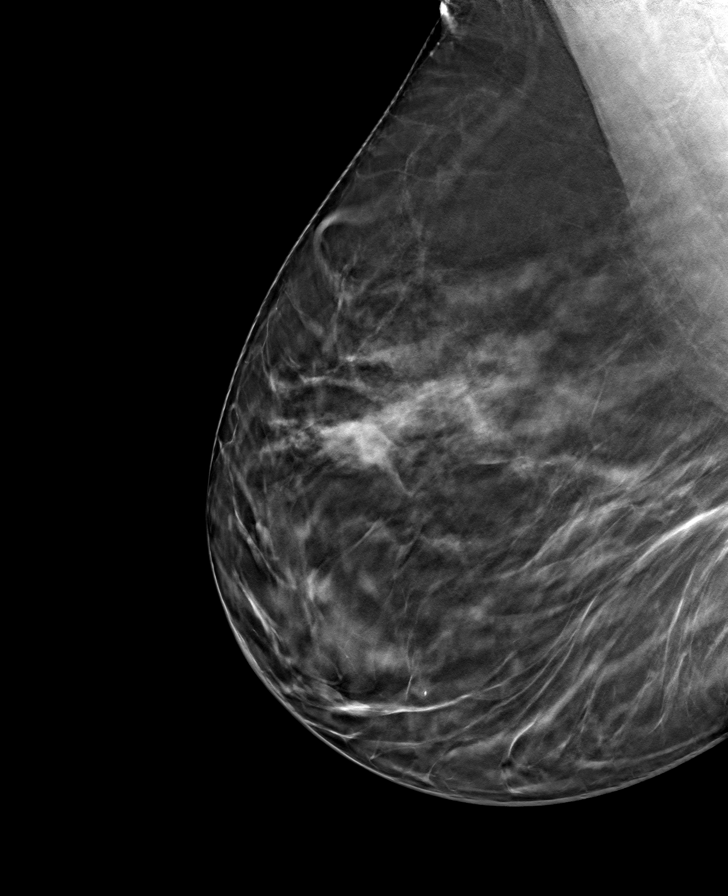

[8 of 24 positions shown; findings below may reference images not displayed]

ACR Breast Density Category c: The breast tissue is heterogeneously
dense, which may obscure small masses.
FINDINGS: There are no findings suspicious for malignancy. Images were
processed with CAD.
IMPRESSION: No mammographic evidence of malignancy. A result letter of this
screening mammogram will be mailed directly to the patient.

RECOMMENDATION:
Screening mammogram in one year. (Code:[id number])

BI-RADS CATEGORY  1: Negative.

## 2021-10-13 ENCOUNTER — Telehealth: Payer: Self-pay | Admitting: Internal Medicine

## 2021-10-13 DIAGNOSIS — E785 Hyperlipidemia, unspecified: Secondary | ICD-10-CM

## 2021-10-13 DIAGNOSIS — E559 Vitamin D deficiency, unspecified: Secondary | ICD-10-CM

## 2021-10-13 DIAGNOSIS — E538 Deficiency of other specified B group vitamins: Secondary | ICD-10-CM

## 2021-10-13 DIAGNOSIS — R739 Hyperglycemia, unspecified: Secondary | ICD-10-CM

## 2021-10-13 DIAGNOSIS — E611 Iron deficiency: Secondary | ICD-10-CM

## 2021-10-13 NOTE — Telephone Encounter (Signed)
Pt has lab appt 1/23 and OV with PCP on 1/26  Please enter lab orders

## 2021-10-13 NOTE — Telephone Encounter (Signed)
Ok done

## 2021-11-05 ENCOUNTER — Other Ambulatory Visit: Payer: Self-pay | Admitting: Internal Medicine

## 2021-11-05 DIAGNOSIS — Z1231 Encounter for screening mammogram for malignant neoplasm of breast: Secondary | ICD-10-CM

## 2021-11-16 ENCOUNTER — Other Ambulatory Visit (INDEPENDENT_AMBULATORY_CARE_PROVIDER_SITE_OTHER): Payer: BC Managed Care – PPO

## 2021-11-16 DIAGNOSIS — E538 Deficiency of other specified B group vitamins: Secondary | ICD-10-CM

## 2021-11-16 DIAGNOSIS — R739 Hyperglycemia, unspecified: Secondary | ICD-10-CM

## 2021-11-16 DIAGNOSIS — E559 Vitamin D deficiency, unspecified: Secondary | ICD-10-CM | POA: Diagnosis not present

## 2021-11-16 DIAGNOSIS — E611 Iron deficiency: Secondary | ICD-10-CM

## 2021-11-16 DIAGNOSIS — E785 Hyperlipidemia, unspecified: Secondary | ICD-10-CM

## 2021-11-16 LAB — CBC WITH DIFFERENTIAL/PLATELET
Basophils Absolute: 0.1 10*3/uL (ref 0.0–0.1)
Basophils Relative: 1.8 % (ref 0.0–3.0)
Eosinophils Absolute: 0.1 10*3/uL (ref 0.0–0.7)
Eosinophils Relative: 2.2 % (ref 0.0–5.0)
HCT: 30 % — ABNORMAL LOW (ref 36.0–46.0)
Hemoglobin: 9.2 g/dL — ABNORMAL LOW (ref 12.0–15.0)
Lymphocytes Relative: 44.8 % (ref 12.0–46.0)
Lymphs Abs: 2.3 10*3/uL (ref 0.7–4.0)
MCHC: 30.7 g/dL (ref 30.0–36.0)
MCV: 57.8 fl — ABNORMAL LOW (ref 78.0–100.0)
Monocytes Absolute: 0.6 10*3/uL (ref 0.1–1.0)
Monocytes Relative: 12.1 % — ABNORMAL HIGH (ref 3.0–12.0)
Neutro Abs: 2 10*3/uL (ref 1.4–7.7)
Neutrophils Relative %: 39.1 % — ABNORMAL LOW (ref 43.0–77.0)
Platelets: 334 10*3/uL (ref 150.0–400.0)
RBC: 5.19 Mil/uL — ABNORMAL HIGH (ref 3.87–5.11)
RDW: 18.7 % — ABNORMAL HIGH (ref 11.5–15.5)
WBC: 5.2 10*3/uL (ref 4.0–10.5)

## 2021-11-16 LAB — VITAMIN D 25 HYDROXY (VIT D DEFICIENCY, FRACTURES): VITD: 9.94 ng/mL — ABNORMAL LOW (ref 30.00–100.00)

## 2021-11-16 LAB — VITAMIN B12: Vitamin B-12: 218 pg/mL (ref 211–911)

## 2021-11-16 LAB — TSH: TSH: 1.63 u[IU]/mL (ref 0.35–5.50)

## 2021-11-16 LAB — HEMOGLOBIN A1C: Hgb A1c MFr Bld: 6 % (ref 4.6–6.5)

## 2021-11-16 LAB — FERRITIN: Ferritin: 3.3 ng/mL — ABNORMAL LOW (ref 10.0–291.0)

## 2021-11-17 LAB — LIPID PANEL
Cholesterol: 168 mg/dL (ref 0–200)
HDL: 44.1 mg/dL (ref >39.00)
LDL Cholesterol: 104 mg/dL — ABNORMAL HIGH (ref 0–99)
NonHDL: 123.49
Total CHOL/HDL Ratio: 4
Triglycerides: 97 mg/dL (ref 0.0–149.0)
VLDL: 19.4 mg/dL (ref 0.0–40.0)

## 2021-11-17 LAB — URINALYSIS, ROUTINE W REFLEX MICROSCOPIC
Bilirubin Urine: NEGATIVE
Hgb urine dipstick: NEGATIVE
Ketones, ur: NEGATIVE
Leukocytes,Ua: NEGATIVE
Nitrite: NEGATIVE
Specific Gravity, Urine: 1.02 (ref 1.000–1.030)
Total Protein, Urine: NEGATIVE
Urine Glucose: 100 — AB
Urobilinogen, UA: 0.2 (ref 0.0–1.0)
pH: 6.5 (ref 5.0–8.0)

## 2021-11-17 LAB — IBC PANEL
Iron: 27 ug/dL — ABNORMAL LOW (ref 42–145)
Saturation Ratios: 4.3 % — ABNORMAL LOW (ref 20.0–50.0)
TIBC: 632.8 ug/dL — ABNORMAL HIGH (ref 250.0–450.0)
Transferrin: 452 mg/dL — ABNORMAL HIGH (ref 212.0–360.0)

## 2021-11-17 LAB — BASIC METABOLIC PANEL
BUN: 10 mg/dL (ref 6–23)
CO2: 26 mEq/L (ref 19–32)
Calcium: 8.6 mg/dL (ref 8.4–10.5)
Chloride: 104 mEq/L (ref 96–112)
Creatinine, Ser: 0.94 mg/dL (ref 0.40–1.20)
GFR: 71.11 mL/min (ref >60.00)
Glucose, Bld: 70 mg/dL (ref 70–99)
Potassium: 3.6 mEq/L (ref 3.5–5.1)
Sodium: 138 mEq/L (ref 135–145)

## 2021-11-17 LAB — HEPATIC FUNCTION PANEL
ALT: 12 U/L (ref 0–35)
AST: 16 U/L (ref 0–37)
Albumin: 4.2 g/dL (ref 3.5–5.2)
Alkaline Phosphatase: 75 U/L (ref 39–117)
Bilirubin, Direct: 0 mg/dL (ref 0.0–0.3)
Total Bilirubin: 0.3 mg/dL (ref 0.2–1.2)
Total Protein: 7.5 g/dL (ref 6.0–8.3)

## 2021-11-19 ENCOUNTER — Encounter: Payer: Self-pay | Admitting: Internal Medicine

## 2021-11-19 ENCOUNTER — Other Ambulatory Visit: Payer: Self-pay

## 2021-11-19 ENCOUNTER — Ambulatory Visit: Payer: BC Managed Care – PPO | Admitting: Internal Medicine

## 2021-11-19 VITALS — BP 128/80 | HR 84 | Temp 98.7°F | Ht 61.0 in | Wt 202.0 lb

## 2021-11-19 DIAGNOSIS — Z0001 Encounter for general adult medical examination with abnormal findings: Secondary | ICD-10-CM

## 2021-11-19 DIAGNOSIS — R7989 Other specified abnormal findings of blood chemistry: Secondary | ICD-10-CM | POA: Diagnosis not present

## 2021-11-19 DIAGNOSIS — E7800 Pure hypercholesterolemia, unspecified: Secondary | ICD-10-CM | POA: Diagnosis not present

## 2021-11-19 DIAGNOSIS — R739 Hyperglycemia, unspecified: Secondary | ICD-10-CM | POA: Diagnosis not present

## 2021-11-19 DIAGNOSIS — D509 Iron deficiency anemia, unspecified: Secondary | ICD-10-CM

## 2021-11-19 NOTE — Assessment & Plan Note (Signed)
Lab Results  Component Value Date   HGBA1C 6.0 11/16/2021   Stable, pt to continue current medical treatment  - diet

## 2021-11-19 NOTE — Assessment & Plan Note (Signed)
Last vitamin D Lab Results  Component Value Date   VD25OH 9.94 (L) 11/16/2021   Low, to start oral replacement

## 2021-11-19 NOTE — Progress Notes (Signed)
Patient ID: Sue Beasley, female   DOB: [birth year], 51 y.o.   MRN: [id number]         Chief Complaint:: wellness exam and low vit d, iron def anemia, hld       HPI:  Sue Beasley is a 51 y.o. female here for wellness exam; declines covid booster, flu shot, tdap, pap and colonoscopy for now; o/w up to date                        Also remains watchful waiting with respect to large fibroid and heavy menses.  Has not been taking her iron supplement recently but willing to restart  Pt denies chest pain, increased sob or doe, wheezing, orthopnea, PND, increased LE swelling, palpitations, dizziness or syncope.   Pt denies polydipsia, polyuria, or new focal neuro s/s.  Pt denies fever, wt loss, night sweats, loss of appetite, or other constitutional symptoms.      Wt Readings from Last 3 Encounters:  11/19/21 202 lb (91.6 kg)  12/31/20 195 lb (88.5 kg)  12/12/20 195 lb (88.5 kg)   BP Readings from Last 3 Encounters:  11/19/21 128/80  12/31/20 130/70  11/18/20 112/74   Immunization History  Administered Date(s) Administered   Influenza Inj Mdck Quad Pf 11/18/2020   Influenza, High Dose Seasonal PF 11/07/2019   Influenza, Quadrivalent, Recombinant, Inj, Pf 11/09/2019   Moderna SARS-COV2 Booster Vaccination 10/09/2020   Moderna Sars-Covid-2 Vaccination 01/03/2020, 02/05/2020, 05/23/2020, 10/09/2020   Td 10/25/2000   Tdap 09/02/2011   There are no preventive care reminders to display for this patient.     Past Medical History:  Diagnosis Date   ALLERGIC RHINITIS 11/06/2009   Allergy    Anemia    ANEMIA-IRON DEFICIENCY 11/20/2009   Asthma    Eczema    Heart murmur    Irreducible umbilical hernia 07/02/9210   Wears glasses    Past Surgical History:  Procedure Laterality Date   UMBILICAL HERNIA REPAIR  05/13/11    reports that she has never smoked. She has never used smokeless tobacco. She reports that she does not drink alcohol and does not use drugs. family history includes  Colon polyps in her sister; Diabetes in her father; Esophageal cancer in her maternal uncle; Hyperlipidemia in her mother; Hypertension in her father. Allergies  Allergen Reactions   Soy Allergy Nausea Only   Amoxicillin-Pot Clavulanate     REACTION: rash   Egg [Eggs Or Egg-Derived Products]    Peanut-Containing Drug Products    Shellfish-Derived Products    Current Outpatient Medications on File Prior to Visit  Medication Sig Dispense Refill   albuterol (VENTOLIN HFA) 108 (90 Base) MCG/ACT inhaler Inhale 2 puffs into the lungs 4 (four) times daily.     azelastine (ASTELIN) 0.1 % nasal spray USE 1 SPRAY IN EACH NOSTRIL TWICE A DAY AS NEEDED  5   desoximetasone (TOPICORT) 0.25 % cream Apply topically 2 (two) times daily.     dorzolamide-timolol (COSOPT) 22.3-6.8 MG/ML ophthalmic solution SMARTSIG:In Eye(s)     EPINEPHrine 0.3 mg/0.3 mL IJ SOAJ injection      fluticasone (FLONASE) 50 MCG/ACT nasal spray 2 SPRAY IN EACH NOSTRIL ONCE A DAY NASALLY 30 DAY(S)  4   fluticasone-salmeterol (ADVAIR) 100-50 MCG/ACT AEPB 1 puff     latanoprost (XALATAN) 0.005 % ophthalmic solution SMARTSIG:In Eye(s)     loratadine (CLARITIN) 10 MG tablet Take 10 mg by mouth daily.     LOW-OGESTREL  0.3-30 MG-MCG tablet Take 1 tablet by mouth daily.     Cholecalciferol (VITAMIN D) 50 MCG (2000 UT) tablet 1 tablet (Patient not taking: Reported on 11/19/2021)     ferrous sulfate 325 (65 FE) MG tablet Take 325 mg by mouth daily. (Patient not taking: Reported on 11/19/2021)     Multiple Vitamin (MULTIVITAMIN) capsule Take 1 capsule by mouth daily. (Patient not taking: Reported on 12/31/2020)     No current facility-administered medications on file prior to visit.        ROS:  All others reviewed and negative.  Objective        PE:  BP 128/80 (BP Location: Right Arm, Patient Position: Sitting, Cuff Size: Large)    Pulse 84    Temp 98.7 F (37.1 C) (Oral)    Ht 5\' 1"  (1.549 m)    Wt 202 lb (91.6 kg)    SpO2 100%    BMI  38.17 kg/m                 Constitutional: Pt appears in NAD               HENT: Head: NCAT.                Right Ear: External ear normal.                 Left Ear: External ear normal.                Eyes: . Pupils are equal, round, and reactive to light. Conjunctivae and EOM are normal               Nose: without d/c or deformity               Neck: Neck supple. Gross normal ROM               Cardiovascular: Normal rate and regular rhythm.                 Pulmonary/Chest: Effort normal and breath sounds without rales or wheezing.                Abd:  Soft, NT, ND, + BS, no organomegaly except for large uterine fibroid               Neurological: Pt is alert. At baseline orientation, motor grossly intact               Skin: Skin is warm. No rashes, no other new lesions, LE edema - none               Psychiatric: Pt behavior is normal without agitation   Micro: none  Cardiac tracings I have personally interpreted today:  none  Pertinent Radiological findings (summarize): none   Lab Results  Component Value Date   WBC 5.2 11/16/2021   HGB 9.2 (L) 11/16/2021   HCT 30.0 (L) 11/16/2021   PLT 334.0 11/16/2021   GLUCOSE 70 11/16/2021   CHOL 168 11/16/2021   TRIG 97.0 11/16/2021   HDL 44.10 11/16/2021   LDLDIRECT 141.6 11/06/2009   LDLCALC 104 (H) 11/16/2021   ALT 12 11/16/2021   AST 16 11/16/2021   NA 138 11/16/2021   K 3.6 11/16/2021   CL 104 11/16/2021   CREATININE 0.94 11/16/2021   BUN 10 11/16/2021   CO2 26 11/16/2021   TSH 1.63 11/16/2021   HGBA1C 6.0 11/16/2021   Assessment/Plan:  Sue Beasley is a  51 y.o. Black or African American [2] female with  has a past medical history of ALLERGIC RHINITIS (11/06/2009), Allergy, Anemia, ANEMIA-IRON DEFICIENCY (11/20/2009), Asthma, Eczema, Heart murmur, Irreducible umbilical hernia (05/25/1030), and Wears glasses.  Encounter for well adult exam with abnormal findings Age and sex appropriate education and counseling updated  with regular exercise and diet Referrals for preventative services - declines pap or colonoscopy for now Immunizations addressed - declines covid booster, flu shot, tdap Smoking counseling  - none needed Evidence for depression or other mood disorder - none significant Most recent labs reviewed. I have personally reviewed and have noted: 1) the patient's medical and social history 2) The patient's current medications and supplements 3) The patient's height, weight, and BMI have been recorded in the chart   Iron deficiency anemia Moderate worsening due to iron supplement non compliance, though was tolerating well, ok to reestart and f/u cbc in 1 mo  Hyperlipidemia Lab Results  Component Value Date   LDLCALC 104 (H) 11/16/2021   Mild uncontrolled,  Goal ldl < 100, pt to continue current low chol diet, declines statin for now   Hyperglycemia Lab Results  Component Value Date   HGBA1C 6.0 11/16/2021   Stable, pt to continue current medical treatment  - diet   Low vitamin D level Last vitamin D Lab Results  Component Value Date   VD25OH 9.94 (L) 11/16/2021   Low, to start oral replacement  Followup: Return in about 1 year (around 11/19/2022).  Cathlean Cower, MD 11/19/2021 8:42 PM [facility]

## 2021-11-19 NOTE — Assessment & Plan Note (Signed)
Age and sex appropriate education and counseling updated with regular exercise and diet Referrals for preventative services - declines pap or colonoscopy for now Immunizations addressed - declines covid booster, flu shot, tdap Smoking counseling  - none needed Evidence for depression or other mood disorder - none significant Most recent labs reviewed. I have personally reviewed and have noted: 1) the patient's medical and social history 2) The patient's current medications and supplements 3) The patient's height, weight, and BMI have been recorded in the chart

## 2021-11-19 NOTE — Patient Instructions (Signed)
Please take OTC Vitamin D3 at 2000 units per day, indefinitely  Please take the Iron supplement twice per day for 1 month, then one per day after that (indefinitely for now)  Please go to the [facility] in 1 month to recheck your iron and anemia levels (and Vit D)  Please continue all other medications as before, and refills have been done if requested.  Please have the pharmacy call with any other refills you may need.  Please continue your efforts at being more active, low cholesterol diet, and weight control.  You are otherwise up to date with prevention measures today.  Please keep your appointments with your specialists as you may have planned  Please make an Appointment to return for your 1 year visit, or sooner if needed, with Lab testing by Appointment as well, to be done about 3-5 days before at the [facility] (so this is for TWO appointments - please see the scheduling desk as you leave)   Due to the ongoing Covid 19 pandemic, our lab now requires an appointment for any labs done at our office.  If you need labs done and do not have an appointment, please call our office ahead of time to schedule before presenting to the lab for your testing.

## 2021-11-19 NOTE — Assessment & Plan Note (Signed)
Moderate worsening due to iron supplement non compliance, though was tolerating well, ok to reestart and f/u cbc in 1 mo

## 2021-11-19 NOTE — Assessment & Plan Note (Signed)
Lab Results  Component Value Date   LDLCALC 104 (H) 11/16/2021   Mild uncontrolled,  Goal ldl < 100, pt to continue current low chol diet, declines statin for now

## 2021-11-24 ENCOUNTER — Ambulatory Visit
Admission: RE | Admit: 2021-11-24 | Discharge: 2021-11-24 | Disposition: A | Discharge status: Home / Self Care | Payer: BC Managed Care – PPO | Source: Ambulatory Visit | Attending: Internal Medicine | Admitting: Internal Medicine

## 2021-11-24 DIAGNOSIS — Z1231 Encounter for screening mammogram for malignant neoplasm of breast: Secondary | ICD-10-CM

## 2021-12-28 ENCOUNTER — Other Ambulatory Visit (INDEPENDENT_AMBULATORY_CARE_PROVIDER_SITE_OTHER): Payer: BC Managed Care – PPO

## 2021-12-28 DIAGNOSIS — D509 Iron deficiency anemia, unspecified: Secondary | ICD-10-CM

## 2021-12-28 LAB — CBC WITH DIFFERENTIAL/PLATELET
Basophils Absolute: 0 10*3/uL (ref 0.0–0.1)
Basophils Relative: 1 % (ref 0.0–3.0)
Eosinophils Absolute: 0.2 10*3/uL (ref 0.0–0.7)
Eosinophils Relative: 3.2 % (ref 0.0–5.0)
HCT: 38.1 % (ref 36.0–46.0)
Hemoglobin: 11.9 g/dL — ABNORMAL LOW (ref 12.0–15.0)
Lymphocytes Relative: 41.8 % (ref 12.0–46.0)
Lymphs Abs: 2.1 10*3/uL (ref 0.7–4.0)
MCHC: 31.2 g/dL (ref 30.0–36.0)
MCV: 65.3 fl — ABNORMAL LOW (ref 78.0–100.0)
Monocytes Absolute: 0.4 10*3/uL (ref 0.1–1.0)
Monocytes Relative: 8.5 % (ref 3.0–12.0)
Neutro Abs: 2.3 10*3/uL (ref 1.4–7.7)
Neutrophils Relative %: 45.5 % (ref 43.0–77.0)
Platelets: 259 10*3/uL (ref 150.0–400.0)
RBC: 5.84 Mil/uL — ABNORMAL HIGH (ref 3.87–5.11)
RDW: 26.9 % — ABNORMAL HIGH (ref 11.5–15.5)
WBC: 5 10*3/uL (ref 4.0–10.5)

## 2021-12-28 LAB — IBC PANEL
Iron: 285 ug/dL — ABNORMAL HIGH (ref 42–145)
Saturation Ratios: 60.8 % — ABNORMAL HIGH (ref 20.0–50.0)
TIBC: 469 ug/dL — ABNORMAL HIGH (ref 250.0–450.0)
Transferrin: 335 mg/dL (ref 212.0–360.0)

## 2021-12-28 LAB — FERRITIN: Ferritin: 12.7 ng/mL (ref 10.0–291.0)

## 2022-05-03 ENCOUNTER — Other Ambulatory Visit: Payer: Self-pay | Admitting: Internal Medicine

## 2022-05-03 DIAGNOSIS — D509 Iron deficiency anemia, unspecified: Secondary | ICD-10-CM

## 2022-05-03 DIAGNOSIS — E538 Deficiency of other specified B group vitamins: Secondary | ICD-10-CM

## 2022-05-03 DIAGNOSIS — R739 Hyperglycemia, unspecified: Secondary | ICD-10-CM

## 2022-05-03 DIAGNOSIS — R7989 Other specified abnormal findings of blood chemistry: Secondary | ICD-10-CM

## 2022-05-03 DIAGNOSIS — E7800 Pure hypercholesterolemia, unspecified: Secondary | ICD-10-CM

## 2022-11-04 ENCOUNTER — Other Ambulatory Visit: Payer: Self-pay | Admitting: Internal Medicine

## 2022-11-04 DIAGNOSIS — Z1231 Encounter for screening mammogram for malignant neoplasm of breast: Secondary | ICD-10-CM

## 2022-11-18 ENCOUNTER — Other Ambulatory Visit (INDEPENDENT_AMBULATORY_CARE_PROVIDER_SITE_OTHER): Payer: BC Managed Care – PPO

## 2022-11-18 DIAGNOSIS — R7989 Other specified abnormal findings of blood chemistry: Secondary | ICD-10-CM

## 2022-11-18 DIAGNOSIS — R739 Hyperglycemia, unspecified: Secondary | ICD-10-CM | POA: Diagnosis not present

## 2022-11-18 DIAGNOSIS — D509 Iron deficiency anemia, unspecified: Secondary | ICD-10-CM | POA: Diagnosis not present

## 2022-11-18 DIAGNOSIS — E7800 Pure hypercholesterolemia, unspecified: Secondary | ICD-10-CM | POA: Diagnosis not present

## 2022-11-18 DIAGNOSIS — E538 Deficiency of other specified B group vitamins: Secondary | ICD-10-CM | POA: Diagnosis not present

## 2022-11-18 LAB — CBC WITH DIFFERENTIAL/PLATELET
Basophils Absolute: 0.1 10*3/uL (ref 0.0–0.1)
Basophils Relative: 1.1 % (ref 0.0–3.0)
Eosinophils Absolute: 0.2 10*3/uL (ref 0.0–0.7)
Eosinophils Relative: 2.8 % (ref 0.0–5.0)
HCT: 37.6 % (ref 36.0–46.0)
Hemoglobin: 12.2 g/dL (ref 12.0–15.0)
Lymphocytes Relative: 41.6 % (ref 12.0–46.0)
Lymphs Abs: 2.3 10*3/uL (ref 0.7–4.0)
MCHC: 32.5 g/dL (ref 30.0–36.0)
MCV: 70.1 fl — ABNORMAL LOW (ref 78.0–100.0)
Monocytes Absolute: 0.5 10*3/uL (ref 0.1–1.0)
Monocytes Relative: 8.6 % (ref 3.0–12.0)
Neutro Abs: 2.5 10*3/uL (ref 1.4–7.7)
Neutrophils Relative %: 45.9 % (ref 43.0–77.0)
Platelets: 300 10*3/uL (ref 150.0–400.0)
RBC: 5.36 Mil/uL — ABNORMAL HIGH (ref 3.87–5.11)
RDW: 15 % (ref 11.5–15.5)
WBC: 5.5 10*3/uL (ref 4.0–10.5)

## 2022-11-18 LAB — HEPATIC FUNCTION PANEL
ALT: 12 U/L (ref 0–35)
AST: 15 U/L (ref 0–37)
Albumin: 4 g/dL (ref 3.5–5.2)
Alkaline Phosphatase: 75 U/L (ref 39–117)
Bilirubin, Direct: 0 mg/dL (ref 0.0–0.3)
Total Bilirubin: 0.2 mg/dL (ref 0.2–1.2)
Total Protein: 7.2 g/dL (ref 6.0–8.3)

## 2022-11-18 LAB — URINALYSIS, ROUTINE W REFLEX MICROSCOPIC
Bilirubin Urine: NEGATIVE
Ketones, ur: NEGATIVE
Leukocytes,Ua: NEGATIVE
Nitrite: NEGATIVE
Specific Gravity, Urine: 1.025 (ref 1.000–1.030)
Total Protein, Urine: 30 — AB
Urine Glucose: NEGATIVE
Urobilinogen, UA: 0.2 (ref 0.0–1.0)
pH: 6 (ref 5.0–8.0)

## 2022-11-18 LAB — IBC PANEL
Iron: 71 ug/dL (ref 42–145)
Saturation Ratios: 14.7 % — ABNORMAL LOW (ref 20.0–50.0)
TIBC: 481.6 ug/dL — ABNORMAL HIGH (ref 250.0–450.0)
Transferrin: 344 mg/dL (ref 212.0–360.0)

## 2022-11-18 LAB — LIPID PANEL
Cholesterol: 193 mg/dL (ref 0–200)
HDL: 47.3 mg/dL (ref >39.00)
LDL Cholesterol: 119 mg/dL — ABNORMAL HIGH (ref 0–99)
NonHDL: 145.66
Total CHOL/HDL Ratio: 4
Triglycerides: 134 mg/dL (ref 0.0–149.0)
VLDL: 26.8 mg/dL (ref 0.0–40.0)

## 2022-11-18 LAB — BASIC METABOLIC PANEL
BUN: 8 mg/dL (ref 6–23)
CO2: 24 mEq/L (ref 19–32)
Calcium: 8.7 mg/dL (ref 8.4–10.5)
Chloride: 105 mEq/L (ref 96–112)
Creatinine, Ser: 0.71 mg/dL (ref 0.40–1.20)
GFR: 98.88 mL/min (ref >60.00)
Glucose, Bld: 100 mg/dL — ABNORMAL HIGH (ref 70–99)
Potassium: 3.6 mEq/L (ref 3.5–5.1)
Sodium: 137 mEq/L (ref 135–145)

## 2022-11-18 LAB — HEMOGLOBIN A1C: Hgb A1c MFr Bld: 5.9 % (ref 4.6–6.5)

## 2022-11-18 LAB — FERRITIN: Ferritin: 12.8 ng/mL (ref 10.0–291.0)

## 2022-11-18 LAB — VITAMIN B12: Vitamin B-12: 221 pg/mL (ref 211–911)

## 2022-11-18 LAB — TSH: TSH: 1.98 u[IU]/mL (ref 0.35–5.50)

## 2022-11-19 LAB — VITAMIN D 25 HYDROXY (VIT D DEFICIENCY, FRACTURES): VITD: 17.15 ng/mL — ABNORMAL LOW (ref 30.00–100.00)

## 2022-11-23 ENCOUNTER — Ambulatory Visit (INDEPENDENT_AMBULATORY_CARE_PROVIDER_SITE_OTHER): Payer: BC Managed Care – PPO | Admitting: Internal Medicine

## 2022-11-23 ENCOUNTER — Encounter: Payer: Self-pay | Admitting: Internal Medicine

## 2022-11-23 VITALS — BP 128/76 | HR 70 | Temp 98.2°F | Ht 61.0 in | Wt 207.0 lb

## 2022-11-23 DIAGNOSIS — E7800 Pure hypercholesterolemia, unspecified: Secondary | ICD-10-CM | POA: Diagnosis not present

## 2022-11-23 DIAGNOSIS — D509 Iron deficiency anemia, unspecified: Secondary | ICD-10-CM

## 2022-11-23 DIAGNOSIS — Z0001 Encounter for general adult medical examination with abnormal findings: Secondary | ICD-10-CM

## 2022-11-23 DIAGNOSIS — E538 Deficiency of other specified B group vitamins: Secondary | ICD-10-CM

## 2022-11-23 DIAGNOSIS — R7989 Other specified abnormal findings of blood chemistry: Secondary | ICD-10-CM | POA: Diagnosis not present

## 2022-11-23 DIAGNOSIS — R739 Hyperglycemia, unspecified: Secondary | ICD-10-CM | POA: Diagnosis not present

## 2022-11-23 NOTE — Assessment & Plan Note (Signed)
Age and sex appropriate education and counseling updated with regular exercise and diet Referrals for preventative services - none needed Immunizations addressed - for shingrix at pharmacy, declines tdap and flu shot Smoking counseling  - none needed Evidence for depression or other mood disorder - none significant Most recent labs reviewed. I have personally reviewed and have noted: 1) the patient's medical and social history 2) The patient's current medications and supplements 3) The patient's height, weight, and BMI have been recorded in the chart

## 2022-11-23 NOTE — Assessment & Plan Note (Signed)
With mild but manageable menorrorhagia, for iron level with labs, f/u Gyn as planned

## 2022-11-23 NOTE — Assessment & Plan Note (Signed)
Last vitamin D Lab Results  Component Value Date   VD25OH 17.15 (L) 11/18/2022   Low, to start oral replacement

## 2022-11-23 NOTE — Assessment & Plan Note (Signed)
Lab Results  Component Value Date   HGBA1C 5.9 11/18/2022   Stable, pt to continue current medical treatment  - diet, wt control, excercise

## 2022-11-23 NOTE — Progress Notes (Signed)
Patient ID: Sue Beasley, female   DOB: 1970/10/27, 52 y.o.   MRN: [id number]         Chief Complaint:: wellness exam and hyperglycemia, hld, low vit d       HPI:  Sue Beasley is a 52 y.o. female here for wellness exam; for shingrix at pharmacy, declines tdap and flu shot, has yearly Gyn f/u appt with known large fibroid,  ow/ up to date                        Also Pt denies chest pain, increased sob or doe, wheezing, orthopnea, PND, increased LE swelling, palpitations, dizziness or syncope.   Pt denies polydipsia, polyuria, or new focal neuro s/s.    Pt denies fever, wt loss, night sweats, loss of appetite, or other constitutional symptoms   Gaining wt, but starting a running program with a club to work up to a [place]. Wt Readings from Last 3 Encounters:  11/23/22 207 lb (93.9 kg)  11/19/21 202 lb (91.6 kg)  12/31/20 195 lb (88.5 kg)   BP Readings from Last 3 Encounters:  11/23/22 128/76  11/19/21 128/80  12/31/20 130/70   Immunization History  Administered Date(s) Administered   Influenza Inj Mdck Quad Pf 11/18/2020   Influenza, High Dose Seasonal PF 11/07/2019   Influenza, Quadrivalent, Recombinant, Inj, Pf 11/09/2019   Moderna SARS-COV2 Booster Vaccination 10/09/2020   Moderna Sars-Covid-2 Vaccination 01/03/2020, 02/05/2020, 05/23/2020, 10/09/2020   Td 10/25/2000   Tdap 09/02/2011   Health Maintenance Due  Topic Date Due   DTaP/Tdap/Td (3 - Td or Tdap) 09/01/2021      Past Medical History:  Diagnosis Date   ALLERGIC RHINITIS 11/06/2009   Allergy    Anemia    ANEMIA-IRON DEFICIENCY 11/20/2009   Asthma    Eczema    Heart murmur    Irreducible umbilical hernia 0/10/270   Wears glasses    Past Surgical History:  Procedure Laterality Date   UMBILICAL HERNIA REPAIR  05/13/11    reports that she has never smoked. She has never used smokeless tobacco. She reports that she does not drink alcohol and does not use drugs. family history includes Colon polyps in her  sister; Diabetes in her father; Esophageal cancer in her maternal uncle; Hyperlipidemia in her mother; Hypertension in her father. Allergies  Allergen Reactions   Soy Allergy Nausea Only   Amoxicillin-Pot Clavulanate     REACTION: rash   Egg [Eggs Or Egg-Derived Products]    Peanut-Containing Drug Products    Shellfish-Derived Products    Current Outpatient Medications on File Prior to Visit  Medication Sig Dispense Refill   albuterol (VENTOLIN HFA) 108 (90 Base) MCG/ACT inhaler Inhale 2 puffs into the lungs 4 (four) times daily.     azelastine (ASTELIN) 0.1 % nasal spray USE 1 SPRAY IN EACH NOSTRIL TWICE A DAY AS NEEDED  5   Cholecalciferol (VITAMIN D) 50 MCG (2000 UT) tablet      desoximetasone (TOPICORT) 0.25 % cream Apply topically 2 (two) times daily.     dorzolamide-timolol (COSOPT) 22.3-6.8 MG/ML ophthalmic solution SMARTSIG:In Eye(s)     EPINEPHrine 0.3 mg/0.3 mL IJ SOAJ injection      ferrous sulfate 325 (65 FE) MG tablet Take 325 mg by mouth daily.     fluticasone (FLONASE) 50 MCG/ACT nasal spray 2 SPRAY IN EACH NOSTRIL ONCE A DAY NASALLY 30 DAY(S)  4   fluticasone-salmeterol (ADVAIR) 100-50 MCG/ACT AEPB 1 puff  latanoprost (XALATAN) 0.005 % ophthalmic solution SMARTSIG:In Eye(s)     loratadine (CLARITIN) 10 MG tablet Take 10 mg by mouth daily.     LOW-OGESTREL 0.3-30 MG-MCG tablet Take 1 tablet by mouth daily.     Multiple Vitamin (MULTIVITAMIN) capsule Take 1 capsule by mouth daily.     No current facility-administered medications on file prior to visit.        ROS:  All others reviewed and negative.  Objective        PE:  BP 128/76 (BP Location: Left Arm, Patient Position: Sitting, Cuff Size: Large)   Pulse 70   Temp 98.2 F (36.8 C) (Oral)   Ht '5\' 1"'$  (1.549 m)   Wt 207 lb (93.9 kg)   SpO2 97%   BMI 39.11 kg/m                 Constitutional: Pt appears in NAD               HENT: Head: NCAT.                Right Ear: External ear normal.                  Left Ear: External ear normal.                Eyes: . Pupils are equal, round, and reactive to light. Conjunctivae and EOM are normal               Nose: without d/c or deformity               Neck: Neck supple. Gross normal ROM               Cardiovascular: Normal rate and regular rhythm.                 Pulmonary/Chest: Effort normal and breath sounds without rales or wheezing.                Abd:  Soft, NT, ND, + BS, no organomegaly               Neurological: Pt is alert. At baseline orientation, motor grossly intact               Skin: Skin is warm. No rashes, no other new lesions, LE edema - none               Psychiatric: Pt behavior is normal without agitation   Micro: none  Cardiac tracings I have personally interpreted today:  none  Pertinent Radiological findings (summarize): none   Lab Results  Component Value Date   WBC 5.5 11/18/2022   HGB 12.2 11/18/2022   HCT 37.6 11/18/2022   PLT 300.0 11/18/2022   GLUCOSE 100 (H) 11/18/2022   CHOL 193 11/18/2022   TRIG 134.0 11/18/2022   HDL 47.30 11/18/2022   LDLDIRECT 141.6 11/06/2009   LDLCALC 119 (H) 11/18/2022   ALT 12 11/18/2022   AST 15 11/18/2022   NA 137 11/18/2022   K 3.6 11/18/2022   CL 105 11/18/2022   CREATININE 0.71 11/18/2022   BUN 8 11/18/2022   CO2 24 11/18/2022   TSH 1.98 11/18/2022   HGBA1C 5.9 11/18/2022   Assessment/Plan:  Sue Beasley is a 52 y.o. Black or African American [2] female with  has a past medical history of ALLERGIC RHINITIS (11/06/2009), Allergy, Anemia, ANEMIA-IRON DEFICIENCY (11/20/2009), Asthma, Eczema, Heart murmur, Irreducible umbilical hernia (0/12/7046), and  Wears glasses.  Low vitamin D level Last vitamin D Lab Results  Component Value Date   VD25OH 17.15 (L) 11/18/2022   Low, to start oral replacement   Encounter for well adult exam with abnormal findings Age and sex appropriate education and counseling updated with regular exercise and diet Referrals for  preventative services - none needed Immunizations addressed - for shingrix at pharmacy, declines tdap and flu shot Smoking counseling  - none needed Evidence for depression or other mood disorder - none significant Most recent labs reviewed. I have personally reviewed and have noted: 1) the patient's medical and social history 2) The patient's current medications and supplements 3) The patient's height, weight, and BMI have been recorded in the chart   Hyperglycemia Lab Results  Component Value Date   HGBA1C 5.9 11/18/2022   Stable, pt to continue current medical treatment  - diet, wt control, excercise   Hyperlipidemia Lab Results  Component Value Date   LDLCALC 119 (H) 11/18/2022   Uncontrolled, declines statin, for lower chol diet   Iron deficiency anemia With mild but manageable menorrorhagia, for iron level with labs, f/u Gyn as planned  Followup: Return in about 1 year (around 11/24/2023).  Cathlean Cower, MD 11/23/2022 4:05 PM [facility]

## 2022-11-23 NOTE — Assessment & Plan Note (Signed)
Lab Results  Component Value Date   LDLCALC 119 (H) 11/18/2022   Uncontrolled, declines statin, for lower chol diet

## 2022-11-23 NOTE — Patient Instructions (Addendum)
Please have your Shingrix (shingles) shots done at your local pharmacy.  Please continue all other medications as before, and refills have been done if requested.  Please have the pharmacy call with any other refills you may need.  Please continue your efforts at being more active, low cholesterol diet, and weight control.  You are otherwise up to date with prevention measures today.  Please keep your appointments with your specialists as you may have planned - yearly GYN appt  No further lab work needed  Please make an Appointment to return for your 1 year visit, or sooner if needed, with Lab testing by Appointment as well, to be done about 3-5 days before at the [facility] (so this is for TWO appointments - please see the scheduling desk as you leave)

## 2022-12-24 DIAGNOSIS — C801 Malignant (primary) neoplasm, unspecified: Secondary | ICD-10-CM

## 2022-12-24 HISTORY — DX: Malignant (primary) neoplasm, unspecified: C80.1

## 2022-12-28 ENCOUNTER — Ambulatory Visit
Admission: RE | Admit: 2022-12-28 | Discharge: 2022-12-28 | Disposition: A | Discharge status: Home / Self Care | Payer: BC Managed Care – PPO | Source: Ambulatory Visit | Attending: Internal Medicine | Admitting: Internal Medicine

## 2022-12-28 DIAGNOSIS — Z1231 Encounter for screening mammogram for malignant neoplasm of breast: Secondary | ICD-10-CM

## 2022-12-30 ENCOUNTER — Other Ambulatory Visit: Payer: Self-pay | Admitting: Internal Medicine

## 2022-12-30 DIAGNOSIS — R928 Other abnormal and inconclusive findings on diagnostic imaging of breast: Secondary | ICD-10-CM

## 2023-01-05 ENCOUNTER — Telehealth: Payer: Self-pay

## 2023-01-05 NOTE — Telephone Encounter (Signed)
Pt STAT [facility] imaging forms signed and faxed( result of faxes ok)

## 2023-01-13 ENCOUNTER — Other Ambulatory Visit: Payer: Self-pay | Admitting: Internal Medicine

## 2023-01-13 ENCOUNTER — Ambulatory Visit
Admission: RE | Admit: 2023-01-13 | Discharge: 2023-01-13 | Disposition: A | Discharge status: Home / Self Care | Payer: BC Managed Care – PPO | Source: Ambulatory Visit | Attending: Internal Medicine | Admitting: Internal Medicine

## 2023-01-13 DIAGNOSIS — R928 Other abnormal and inconclusive findings on diagnostic imaging of breast: Secondary | ICD-10-CM

## 2023-01-13 DIAGNOSIS — R599 Enlarged lymph nodes, unspecified: Secondary | ICD-10-CM

## 2023-01-13 DIAGNOSIS — N6320 Unspecified lump in the left breast, unspecified quadrant: Secondary | ICD-10-CM

## 2023-01-14 ENCOUNTER — Ambulatory Visit
Admission: RE | Admit: 2023-01-14 | Discharge: 2023-01-14 | Disposition: A | Discharge status: Home / Self Care | Payer: BC Managed Care – PPO | Source: Ambulatory Visit | Attending: Internal Medicine | Admitting: Internal Medicine

## 2023-01-14 DIAGNOSIS — R599 Enlarged lymph nodes, unspecified: Secondary | ICD-10-CM

## 2023-01-14 DIAGNOSIS — N6320 Unspecified lump in the left breast, unspecified quadrant: Secondary | ICD-10-CM

## 2023-01-14 DIAGNOSIS — R928 Other abnormal and inconclusive findings on diagnostic imaging of breast: Secondary | ICD-10-CM

## 2023-01-14 HISTORY — PX: BREAST BIOPSY: SHX20

## 2023-01-18 ENCOUNTER — Telehealth: Payer: Self-pay | Admitting: Hematology and Oncology

## 2023-01-18 NOTE — Telephone Encounter (Signed)
Spoke to patient to confirm upcoming afternoon [facility] clinic appointment on 4/3, paperwork will be sent via e-mail.   Gave location and time, also informed patient that the surgeon's office would be calling as well to get information from them similar to the packet that they will be receiving so make sure to do both.  Reminded patient that all providers will be coming to the clinic to see them HERE and if they had any questions to not hesitate to reach back out to myself or their navigators.

## 2023-01-24 ENCOUNTER — Encounter: Payer: Self-pay | Admitting: *Deleted

## 2023-01-24 DIAGNOSIS — Z170 Estrogen receptor positive status [ER+]: Secondary | ICD-10-CM | POA: Insufficient documentation

## 2023-01-25 NOTE — Progress Notes (Signed)
Radiation Oncology         [phone] ________________________________  [facility]) Initial Outpatient Consultation  Name: Sue Beasley MRN: [id number]  Date: 01/26/2023  DOB: 11/22/1970  [id number], Hunt Oris, MD  Rolm Bookbinder, MD   REFERRING PHYSICIAN: Rolm Bookbinder, MD  DIAGNOSIS: There were no encounter diagnoses.  Stage *** Left Breast UOQ, Invasive Ductal Carcinoma, ER+ / PR+ / Her2-, Grade 3  No diagnosis found.  HISTORY OF PRESENT ILLNESS::Sue Beasley is a 52 y.o. female who is presenting to the office today for evaluation of her newly diagnosed breast cancer. She is accompanied by ***. She is doing well overall.   She had routine screening mammography on 12/28/22 showing a possible abnormality in the left breast. She underwent left breast diagnostic mammography with tomography and left breast ultrasonography at [facility] on 01/13/23 showing: an irregular mass in the 12:30 o'clock left breast, 6 cmfn, measuring 10 x 11 x 11 mm. A single abnormal lymph node was also appreciated, with a cortex measuring 5.7 mm.  Biopsy of the 12:30 o'clock left breast on 01/14/23 showed: grade 3 invasive ductal carcinoma measuring 11 mm in the greatest linear extent of the sample. Prognostic indicators significant for: estrogen receptor, 100% positive with strong staining intensity and progesterone receptor, 3% positive with moderate staining intensity. Proliferation marker Ki67 at 70%. HER2 negative. Biopsy of the left axillary lymph node showed no evidence of malignancy.   Menarche: *** years old Age at first live birth: *** years old GP: *** LMP: *** Contraceptive: *** HRT: ***   The patient was referred today for presentation in the multidisciplinary conference.  Radiology studies and pathology slides were presented there for review and discussion of treatment options.  A consensus was discussed regarding potential next  steps.  PREVIOUS RADIATION THERAPY: {EXAM; YES/NO:19492::"No"}  PAST MEDICAL HISTORY:  Past Medical History:  Diagnosis Date   ALLERGIC RHINITIS 11/06/2009   Allergy    Anemia    ANEMIA-IRON DEFICIENCY 11/20/2009   Asthma    Eczema    Heart murmur    Irreducible umbilical hernia 0000000   Wears glasses     PAST SURGICAL HISTORY: Past Surgical History:  Procedure Laterality Date   BREAST BIOPSY Left 01/14/2023   [place] LT BREAST BX W LOC DEV 1ST LESION IMG BX SPEC US GUIDE 01/14/2023 [facility] MAMMOGRAPHY   UMBILICAL HERNIA REPAIR  05/13/11    FAMILY HISTORY:  Family History  Problem Relation Age of Onset   Hyperlipidemia Mother    Diabetes Father    Hypertension Father    Colon polyps Sister    Esophageal cancer Maternal Uncle    Breast cancer Neg Hx    Colon cancer Neg Hx    Stomach cancer Neg Hx    Rectal cancer Neg Hx     SOCIAL HISTORY:  Social History   Socioeconomic History   Marital status: Single    Spouse name: Not on file   Number of children: 0   Years of education: Not on file   Highest education level: Not on file  Occupational History   Occupation: Music therapist    Comment: calculus  Tobacco Use   Smoking status: Never   Smokeless tobacco: Never  Substance and Sexual Activity   Alcohol use: No   Drug use: No   Sexual activity: Not on file  Other Topics Concern   Not on file  Social History Narrative   Not on file  Social Determinants of Health   Financial Resource Strain: Not on file  Food Insecurity: Not on file  Transportation Needs: Not on file  Physical Activity: Not on file  Stress: Not on file  Social Connections: Not on file    ALLERGIES:  Allergies  Allergen Reactions   Soy Allergy Nausea Only   Amoxicillin-Pot Clavulanate     REACTION: rash   Egg [Egg-Derived Products]    Peanut-Containing Drug Products    Shellfish-Derived Products     MEDICATIONS:  Current Outpatient Medications  Medication Sig Dispense Refill    albuterol (VENTOLIN HFA) 108 (90 Base) MCG/ACT inhaler Inhale 2 puffs into the lungs 4 (four) times daily.     azelastine (ASTELIN) 0.1 % nasal spray USE 1 SPRAY IN EACH NOSTRIL TWICE A DAY AS NEEDED  5   Cholecalciferol (VITAMIN D) 50 MCG (2000 UT) tablet      desoximetasone (TOPICORT) 0.25 % cream Apply topically 2 (two) times daily.     dorzolamide-timolol (COSOPT) 22.3-6.8 MG/ML ophthalmic solution SMARTSIG:In Eye(s)     EPINEPHrine 0.3 mg/0.3 mL IJ SOAJ injection      ferrous sulfate 325 (65 FE) MG tablet Take 325 mg by mouth daily.     fluticasone (FLONASE) 50 MCG/ACT nasal spray 2 SPRAY IN EACH NOSTRIL ONCE A DAY NASALLY 30 DAY(S)  4   fluticasone-salmeterol (ADVAIR) 100-50 MCG/ACT AEPB 1 puff     latanoprost (XALATAN) 0.005 % ophthalmic solution SMARTSIG:In Eye(s)     loratadine (CLARITIN) 10 MG tablet Take 10 mg by mouth daily.     LOW-OGESTREL 0.3-30 MG-MCG tablet Take 1 tablet by mouth daily.     Multiple Vitamin (MULTIVITAMIN) capsule Take 1 capsule by mouth daily.     No current facility-administered medications for this encounter.    REVIEW OF SYSTEMS: A 10+ POINT REVIEW OF SYSTEMS WAS OBTAINED including neurology, dermatology, psychiatry, cardiac, respiratory, lymph, extremities, GI, GU, musculoskeletal, constitutional, reproductive, HEENT. On the provided form, she reports ***. She denies *** and any other symptoms.    PHYSICAL EXAM:  vitals were not taken for this visit.  {may need to copy over vitals} Lungs are clear to auscultation bilaterally. Heart has regular rate and rhythm. No palpable cervical, supraclavicular, or axillary adenopathy. Abdomen soft, non-tender, normal bowel sounds. Breast: *** breast with no palpable mass, nipple discharge, or bleeding. *** breast with ***.   KPS = ***  100 - Normal; no complaints; no evidence of disease. 90   - Able to carry on normal activity; minor signs or symptoms of disease. 80   - Normal activity with effort; some signs or  symptoms of disease. 39   - Cares for self; unable to carry on normal activity or to do active work. 60   - Requires occasional assistance, but is able to care for most of his personal needs. 50   - Requires considerable assistance and frequent medical care. 66   - Disabled; requires special care and assistance. 39   - Severely disabled; hospital admission is indicated although death not imminent. 26   - Very sick; hospital admission necessary; active supportive treatment necessary. 10   - Moribund; fatal processes progressing rapidly. 0     - Dead  Karnofsky DA, Abelmann [place], Craver LS and Burchenal [facility] [phone]) The use of the nitrogen mustards in the palliative treatment of carcinoma: with particular reference to bronchogenic carcinoma Cancer 1 634-56  LABORATORY DATA:  Lab Results  Component Value Date   WBC 5.5 11/18/2022  HGB 12.2 11/18/2022   HCT 37.6 11/18/2022   MCV 70.1 (L) 11/18/2022   PLT 300.0 11/18/2022   Lab Results  Component Value Date   NA 137 11/18/2022   K 3.6 11/18/2022   CL 105 11/18/2022   CO2 24 11/18/2022   Lab Results  Component Value Date   ALT 12 11/18/2022   AST 15 11/18/2022   ALKPHOS 75 11/18/2022   BILITOT 0.2 11/18/2022    PULMONARY FUNCTION TEST:   Review Flowsheet        No data to display          RADIOGRAPHY: [place] LT BREAST BX W LOC DEV 1ST LESION IMG BX SPEC US GUIDE  Addendum Date: 01/19/2023   ADDENDUM REPORT: 01/19/2023 08:01 ADDENDUM: Pathology revealed: GRADE III INVASIVE POORLY DIFFERENTIATED DUCTAL ADENOCARCINOMA of the LEFT breast, 12:30 o'clock, 6 cmfn, (heart clip). This was found to be concordant by Dr. Peggye Fothergill. Pathology revealed: COMPATIBLE WITH A BENIGN REACTIVE LYMPH NODE of the LEFT axilla, (hydromark clip). This was found to be concordant by Dr. Peggye Fothergill. Pathology results were discussed with the patient by telephone. The patient reported doing well after the biopsies with tenderness at the sites and LEFT  shoulder stiffness. Post biopsy instructions and care were reviewed and questions were answered. The patient was encouraged to call [facility] for any additional concerns. My direct phone number was provided. The patient was referred to [facility] on January 26, 2023. Pathology results reported by Terie Purser, RN on 01/17/2023. Electronically Signed   By: Evangeline Dakin M.D.   On: 01/19/2023 08:01   Result Date: 01/19/2023 CLINICAL DATA:  52 year old with a screening detected highly suspicious 1.2 cm mass in the upper LEFT breast at the 12:30 o'clock position 6 cm from the nipple. She also has a solitary abnormal LEFT axillary lymph node with cortical thickening up to 0.6 cm. EXAM: ULTRASOUND-GUIDED LEFT BREAST CORE NEEDLE BIOPSY ULTRASOUND-GUIDED LEFT AXILLARY LYMPH NODE CORE NEEDLE BIOPSY COMPARISON:  Previous exam(s). PROCEDURE: I met with the patient and we discussed the procedure of ultrasound-guided biopsy, including benefits and alternatives. We discussed the high likelihood of a successful procedure. We discussed the risks of the procedure, including infection, bleeding, tissue injury, clip migration, and inadequate sampling. Informed written consent was given. The usual time-out protocol was performed immediately prior to the procedure. #1) LEFT breast mass, lesion quadrant: UPPER OUTER QUADRANT. Using sterile technique with chlorhexidine as skin antisepsis, 1% Lidocaine as local anesthetic, under direct ultrasound visualization, a 12 gauge Bard Marquee core needle device placed through an 11 gauge introducer needle was used to perform biopsy of the mass in the [place] using a lateral approach. At the conclusion of the procedure, a heart shaped tissue marker clip was deployed into the biopsy cavity. #2) LEFT axillary lymph node: Using sterile technique with chlorhexidine as skin  antisepsis, 1% lidocaine and 1% lidocaine with epinephrine as local anesthetic, under direct ultrasound visualization, a 14 gauge Bard Marquee core needle device placed through a 13 gauge introducer needle was used perform biopsy of the abnormal LEFT axillary lymph node using an inferolateral approach. At the conclusion of the procedure, a HydroMark spiral shaped tissue marker clip was deployed into the biopsy cavity. The patient tolerated the procedures well without apparent immediate complications. Follow up 2 view mammogram was performed and dictated separately. IMPRESSION: 1. Ultrasound guided core needle biopsy of a  highly suspicious 1.2 cm mass in the UPPER OUTER QUADRANT of the LEFT breast. 2. Ultrasound guided core needle biopsy of a solitary abnormal LEFT axillary lymph node demonstrating cortical thickening up to 0.6 cm. Electronically Signed: By: Evangeline Dakin M.D. On: 01/14/2023 14:19  [place] AXILLARY NODE CORE BIOPSY LEFT  Addendum Date: 01/19/2023   ADDENDUM REPORT: 01/19/2023 08:01 ADDENDUM: Pathology revealed: GRADE III INVASIVE POORLY DIFFERENTIATED DUCTAL ADENOCARCINOMA of the LEFT breast, 12:30 o'clock, 6 cmfn, (heart clip). This was found to be concordant by Dr. Peggye Fothergill. Pathology revealed: COMPATIBLE WITH A BENIGN REACTIVE LYMPH NODE of the LEFT axilla, (hydromark clip). This was found to be concordant by Dr. Peggye Fothergill. Pathology results were discussed with the patient by telephone. The patient reported doing well after the biopsies with tenderness at the sites and LEFT shoulder stiffness. Post biopsy instructions and care were reviewed and questions were answered. The patient was encouraged to call [facility] for any additional concerns. My direct phone number was provided. The patient was referred to [facility] on January 26, 2023. Pathology results reported by Terie Purser, RN  on 01/17/2023. Electronically Signed   By: Evangeline Dakin M.D.   On: 01/19/2023 08:01   Result Date: 01/19/2023 CLINICAL DATA:  52 year old with a screening detected highly suspicious 1.2 cm mass in the upper LEFT breast at the 12:30 o'clock position 6 cm from the nipple. She also has a solitary abnormal LEFT axillary lymph node with cortical thickening up to 0.6 cm. EXAM: ULTRASOUND-GUIDED LEFT BREAST CORE NEEDLE BIOPSY ULTRASOUND-GUIDED LEFT AXILLARY LYMPH NODE CORE NEEDLE BIOPSY COMPARISON:  Previous exam(s). PROCEDURE: I met with the patient and we discussed the procedure of ultrasound-guided biopsy, including benefits and alternatives. We discussed the high likelihood of a successful procedure. We discussed the risks of the procedure, including infection, bleeding, tissue injury, clip migration, and inadequate sampling. Informed written consent was given. The usual time-out protocol was performed immediately prior to the procedure. #1) LEFT breast mass, lesion quadrant: UPPER OUTER QUADRANT. Using sterile technique with chlorhexidine as skin antisepsis, 1% Lidocaine as local anesthetic, under direct ultrasound visualization, a 12 gauge Bard Marquee core needle device placed through an 11 gauge introducer needle was used to perform biopsy of the mass in the [place] using a lateral approach. At the conclusion of the procedure, a heart shaped tissue marker clip was deployed into the biopsy cavity. #2) LEFT axillary lymph node: Using sterile technique with chlorhexidine as skin antisepsis, 1% lidocaine and 1% lidocaine with epinephrine as local anesthetic, under direct ultrasound visualization, a 14 gauge Bard Marquee core needle device placed through a 13 gauge introducer needle was used perform biopsy of the abnormal LEFT axillary lymph node using an inferolateral approach. At the conclusion of the procedure, a HydroMark spiral shaped tissue marker clip was deployed into the biopsy cavity. The  patient tolerated the procedures well without apparent immediate complications. Follow up 2 view mammogram was performed and dictated separately. IMPRESSION: 1. Ultrasound guided core needle biopsy of a highly suspicious 1.2 cm mass in the UPPER OUTER QUADRANT of the LEFT breast. 2. Ultrasound guided core needle biopsy of a solitary abnormal LEFT axillary lymph node demonstrating cortical thickening up to 0.6 cm. Electronically Signed: By: Evangeline Dakin M.D. On: 01/14/2023 14:19  MM CLIP PLACEMENT LEFT  Result Date: 01/14/2023 CLINICAL DATA:  Confirmation of clip placement after ultrasound-guided core needle biopsy of a  highly suspicious mass in the UPPER OUTER QUADRANT of the LEFT breast and ultrasound-guided core needle biopsy of a solitary abnormal LEFT axillary lymph node. EXAM: 2D and 3D DIAGNOSTIC LEFT MAMMOGRAM POST ULTRASOUND BIOPSY COMPARISON:  Previous exam(s). FINDINGS: 2D and 3D full field CC and mediolateral mammographic images and a spot mammographic image of the axilla were obtained following ultrasound guided biopsy of a mass in the UPPER OUTER QUADRANT of the LEFT breast and a solitary abnormal LEFT axillary lymph node. The heart shaped tissue marking clip is appropriately positioned within the biopsied mass in the [place] spiral shaped tissue marking clip is appropriately positioned within the biopsied lymph node in the low axilla. Expected post biopsy changes are present at both sites without evidence of hematoma. IMPRESSION: 1. Appropriate positioning of the heart shaped biopsy marking clip within the biopsied mass in the [place] of the LEFT breast. 2. Appropriate positioning of the HydroMark spiral shaped tissue marking clip within the biopsied lymph node in the low LEFT axilla. Final Assessment: Post Procedure Mammograms for Marker Placement Electronically Signed   By: Evangeline Dakin M.D.   On: 01/14/2023 14:20  MM 3D DIAGNOSTIC MAMMOGRAM  UNILATERAL LEFT BREAST  Result Date: 01/13/2023 CLINICAL DATA:  The patient was called back for spiculated mass in the superior slightly lateral left breast. EXAM: DIGITAL DIAGNOSTIC UNILATERAL LEFT MAMMOGRAM WITH TOMOSYNTHESIS; ULTRASOUND LEFT BREAST LIMITED TECHNIQUE: Left digital diagnostic mammography and breast tomosynthesis was performed.; Targeted ultrasound examination of the left breast was performed. COMPARISON:  Previous exam(s). ACR Breast Density Category b: There are scattered areas of fibroglandular density. FINDINGS: There is a spiculated mass in the superior slightly lateral left breast. Targeted ultrasound is performed, showing an irregular mass in the left breast at 12:30, 6 cm from the nipple measuring 10 x 11 x 11 mm. There is a single abnormal lymph node with a cortex measuring 5.7 mm. IMPRESSION: Highly suspicious mass in the left breast at 12:30. Single abnormal lymph node in the left axilla. RECOMMENDATION: Recommend ultrasound-guided biopsy of a left breast mass and a left axillary lymph node. I have discussed the findings and recommendations with the patient. If applicable, a reminder letter will be sent to the patient regarding the next appointment. BI-RADS CATEGORY  5: Highly suggestive of malignancy. Electronically Signed   By: Dorise Bullion III M.D.   On: 01/13/2023 11:36  [place] LIMITED ULTRASOUND INCLUDING AXILLA LEFT BREAST   Result Date: 01/13/2023 CLINICAL DATA:  The patient was called back for spiculated mass in the superior slightly lateral left breast. EXAM: DIGITAL DIAGNOSTIC UNILATERAL LEFT MAMMOGRAM WITH TOMOSYNTHESIS; ULTRASOUND LEFT BREAST LIMITED TECHNIQUE: Left digital diagnostic mammography and breast tomosynthesis was performed.; Targeted ultrasound examination of the left breast was performed. COMPARISON:  Previous exam(s). ACR Breast Density Category b: There are scattered areas of fibroglandular density. FINDINGS: There is a spiculated mass in the superior  slightly lateral left breast. Targeted ultrasound is performed, showing an irregular mass in the left breast at 12:30, 6 cm from the nipple measuring 10 x 11 x 11 mm. There is a single abnormal lymph node with a cortex measuring 5.7 mm. IMPRESSION: Highly suspicious mass in the left breast at 12:30. Single abnormal lymph node in the left axilla. RECOMMENDATION: Recommend ultrasound-guided biopsy of a left breast mass and a left axillary lymph node. I have discussed the findings and recommendations with the patient. If applicable, a reminder letter will be sent to the patient regarding the next  appointment. BI-RADS CATEGORY  5: Highly suggestive of malignancy. Electronically Signed   By: Dorise Bullion III M.D.   On: 01/13/2023 11:36  MM 3D SCREEN BREAST BILATERAL  Result Date: 12/29/2022 CLINICAL DATA:  Screening. EXAM: DIGITAL SCREENING BILATERAL MAMMOGRAM WITH TOMOSYNTHESIS AND CAD TECHNIQUE: Bilateral screening digital craniocaudal and mediolateral oblique mammograms were obtained. Bilateral screening digital breast tomosynthesis was performed. The images were evaluated with computer-aided detection. COMPARISON:  Previous exam(s). ACR Breast Density Category b: There are scattered areas of fibroglandular density. FINDINGS: In the left breast, a possible mass warrants further evaluation. In the right breast, no findings suspicious for malignancy. IMPRESSION: Further evaluation is suggested for a possible mass in the left breast. RECOMMENDATION: Diagnostic mammogram and possibly ultrasound of the left breast. (Code:[id number]) The patient will be contacted regarding the findings, and additional imaging will be scheduled. BI-RADS CATEGORY  0: Incomplete: Need additional imaging evaluation. Electronically Signed   By: Lillia Mountain M.D.   On: 12/29/2022 17:02      IMPRESSION: ***   Patient will be a good candidate for breast conservation with radiotherapy to the left breast. We discussed the general course of  radiation, potential side effects, and toxicities with radiation and the patient is interested in this approach. ***   PLAN:  ***    ------------------------------------------------  Blair Promise, PhD, MD  This document serves as a record of services personally performed by Gery Pray, MD. It was created on his behalf by Roney Mans, a trained medical scribe. The creation of this record is based on the scribe's personal observations and the provider's statements to them. This document has been checked and approved by the attending provider.

## 2023-01-26 ENCOUNTER — Inpatient Hospital Stay: Payer: BC Managed Care – PPO

## 2023-01-26 ENCOUNTER — Ambulatory Visit
Admission: RE | Admit: 2023-01-26 | Discharge: 2023-01-26 | Disposition: A | Discharge status: Home / Self Care | Payer: BC Managed Care – PPO | Source: Ambulatory Visit | Attending: Radiation Oncology | Admitting: Radiation Oncology

## 2023-01-26 ENCOUNTER — Encounter: Payer: Self-pay | Admitting: *Deleted

## 2023-01-26 ENCOUNTER — Inpatient Hospital Stay (HOSPITAL_BASED_OUTPATIENT_CLINIC_OR_DEPARTMENT_OTHER): Payer: BC Managed Care – PPO | Admitting: Genetic Counselor

## 2023-01-26 ENCOUNTER — Other Ambulatory Visit: Payer: Self-pay | Admitting: General Surgery

## 2023-01-26 ENCOUNTER — Encounter: Payer: Self-pay | Admitting: General Practice

## 2023-01-26 ENCOUNTER — Inpatient Hospital Stay: Payer: BC Managed Care – PPO | Attending: Hematology and Oncology | Admitting: Hematology and Oncology

## 2023-01-26 VITALS — BP 181/75 | HR 79 | Temp 97.5°F | Resp 18 | Ht 61.0 in | Wt 205.4 lb

## 2023-01-26 DIAGNOSIS — C50412 Malignant neoplasm of upper-outer quadrant of left female breast: Secondary | ICD-10-CM

## 2023-01-26 DIAGNOSIS — Z170 Estrogen receptor positive status [ER+]: Secondary | ICD-10-CM

## 2023-01-26 DIAGNOSIS — Z8042 Family history of malignant neoplasm of prostate: Secondary | ICD-10-CM

## 2023-01-26 DIAGNOSIS — Z800 Family history of malignant neoplasm of digestive organs: Secondary | ICD-10-CM

## 2023-01-26 LAB — CBC WITH DIFFERENTIAL (CANCER CENTER ONLY)
Abs Immature Granulocytes: 0.01 10*3/uL (ref 0.00–0.07)
Basophils Absolute: 0.1 10*3/uL (ref 0.0–0.1)
Basophils Relative: 1 %
Eosinophils Absolute: 0.1 10*3/uL (ref 0.0–0.5)
Eosinophils Relative: 2 %
HCT: 39.9 % (ref 36.0–46.0)
Hemoglobin: 12.6 g/dL (ref 12.0–15.0)
Immature Granulocytes: 0 %
Lymphocytes Relative: 43 %
Lymphs Abs: 1.9 10*3/uL (ref 0.7–4.0)
MCH: 22.4 pg — ABNORMAL LOW (ref 26.0–34.0)
MCHC: 31.6 g/dL (ref 30.0–36.0)
MCV: 71 fL — ABNORMAL LOW (ref 80.0–100.0)
Monocytes Absolute: 0.4 10*3/uL (ref 0.1–1.0)
Monocytes Relative: 10 %
Neutro Abs: 2 10*3/uL (ref 1.7–7.7)
Neutrophils Relative %: 44 %
Platelet Count: 298 10*3/uL (ref 150–400)
RBC: 5.62 MIL/uL — ABNORMAL HIGH (ref 3.87–5.11)
RDW: 16.1 % — ABNORMAL HIGH (ref 11.5–15.5)
WBC Count: 4.5 10*3/uL (ref 4.0–10.5)
nRBC: 0 % (ref 0.0–0.2)

## 2023-01-26 LAB — CMP (CANCER CENTER ONLY)
ALT: 5 U/L (ref 0–44)
AST: 17 U/L (ref 15–41)
Albumin: 3.9 g/dL (ref 3.5–5.0)
Alkaline Phosphatase: 77 U/L (ref 38–126)
Anion gap: 5 (ref 5–15)
BUN: 8 mg/dL (ref 6–20)
CO2: 27 mmol/L (ref 22–32)
Calcium: 8.9 mg/dL (ref 8.9–10.3)
Chloride: 106 mmol/L (ref 98–111)
Creatinine: 0.9 mg/dL (ref 0.44–1.00)
GFR, Estimated: >60 mL/min (ref >60)
Glucose, Bld: 111 mg/dL — ABNORMAL HIGH (ref 70–99)
Potassium: 3.5 mmol/L (ref 3.5–5.1)
Sodium: 138 mmol/L (ref 135–145)
Total Bilirubin: 0.4 mg/dL (ref 0.3–1.2)
Total Protein: 7.3 g/dL (ref 6.5–8.1)

## 2023-01-26 LAB — GENETIC SCREENING ORDER

## 2023-01-26 NOTE — Assessment & Plan Note (Signed)
01/14/2023:Screening mammogram detected left breast mass UOQ 12:30 position: 1.1 cm, ultrasound biopsy revealed poorly differentiated IDC grade 3, ER 100%, PR 3%, Ki-67 70%, HER2 negative, axillary lymph node biopsy: Benign concordant  Pathology and radiology counseling:Discussed with the patient, the details of pathology including the type of breast cancer,the clinical staging, the significance of ER, PR and HER-2/neu receptors and the implications for treatment. After reviewing the pathology in detail, we proceeded to discuss the different treatment options between surgery, radiation, chemotherapy, antiestrogen therapies.  Recommendations: 1. Breast conserving surgery followed by 2. Oncotype DX testing to determine if chemotherapy would be of any benefit followed by 3. Adjuvant radiation therapy followed by 4. Adjuvant antiestrogen therapy  Oncotype counseling: I discussed Oncotype DX test. I explained to the patient that this is a 21 gene panel to evaluate patient tumors DNA to calculate recurrence score. This would help determine whether patient has high risk or low risk breast cancer. She understands that if her tumor was found to be high risk, she would benefit from systemic chemotherapy. If low risk, no need of chemotherapy.  Return to clinic after surgery to discuss final pathology report and then determine if Oncotype DX testing will need to be sent.

## 2023-01-26 NOTE — Progress Notes (Signed)
[place] NOTE  Patient Care Team: Biagio Borg, MD as PCP - General Nicholas Lose, MD as Consulting Physician (Hematology and Oncology) Gery Pray, MD as Consulting Physician (Radiation Oncology) Rolm Bookbinder, MD as Consulting Physician (General Surgery) Mauro Kaufmann, RN as Oncology Nurse Navigator Rockwell Germany, RN as Oncology Nurse Navigator  CHIEF COMPLAINTS/PURPOSE OF CONSULTATION:  Newly diagnosed breast cancer  HISTORY OF PRESENTING ILLNESS:  Sue Beasley 52 y.o. female is here because of recent diagnosis of left breast cancer.  Patient has routine screening mammogram that detected a left breast mass measuring 1.1 cm ultrasound-guided biopsy revealed grade 3 IDC with ER 100% PR 3% Ki67 70%, HER2 negative and axillary lymph node was detected which on biopsy was benign and concordant.  She was presented this morning to the multidisciplinary tumor board and she is here today accompanied by her family to discuss her treatment plan.  I reviewed her records extensively and collaborated the history with the patient.  SUMMARY OF ONCOLOGIC HISTORY: Oncology History  Malignant neoplasm of upper-outer quadrant of left breast in female, estrogen receptor positive  01/14/2023 Initial Diagnosis   Screening mammogram detected left breast mass UOQ 12:30 position: 1.1 cm, ultrasound biopsy revealed poorly differentiated IDC grade 3, ER 100%, PR 3%, Ki-67 70%, HER2 negative, axillary lymph node biopsy: Benign concordant   01/26/2023 Cancer Staging   Staging form: Breast, AJCC 8th Edition - Clinical: Stage IA (cT1c, cN0, cM0, G3, ER+, PR+, HER2-) - Signed by Nicholas Lose, MD on 01/26/2023 Histologic grading system: 3 grade system      MEDICAL HISTORY:  Past Medical History:  Diagnosis Date   ALLERGIC RHINITIS 11/06/2009   Allergy    Anemia    ANEMIA-IRON DEFICIENCY 11/20/2009   Asthma    Eczema    Heart murmur    Irreducible umbilical hernia  0000000   Wears glasses     SURGICAL HISTORY: Past Surgical History:  Procedure Laterality Date   BREAST BIOPSY Left 01/14/2023   [place] LT BREAST BX W LOC DEV 1ST LESION IMG BX [place] US GUIDE 01/14/2023 [facility] MAMMOGRAPHY   UMBILICAL HERNIA REPAIR  05/13/11    SOCIAL HISTORY: Social History   Socioeconomic History   Marital status: Single    Spouse name: Not on file   Number of children: 0   Years of education: Not on file   Highest education level: Not on file  Occupational History   Occupation: Music therapist    Comment: calculus  Tobacco Use   Smoking status: Never   Smokeless tobacco: Never  Substance and Sexual Activity   Alcohol use: No   Drug use: No   Sexual activity: Not on file  Other Topics Concern   Not on file  Social History Narrative   Not on file   Social Determinants of Health   Financial Resource Strain: Not on file  Food Insecurity: Not on file  Transportation Needs: Not on file  Physical Activity: Not on file  Stress: Not on file  Social Connections: Not on file  Intimate Partner Violence: Not on file    FAMILY HISTORY: Family History  Problem Relation Age of Onset   Hyperlipidemia Mother    Diabetes Father    Hypertension Father    Colon polyps Sister    Esophageal cancer Maternal Uncle    Breast cancer Neg Hx    Colon cancer Neg Hx    Stomach cancer Neg Hx    Rectal cancer Neg  Hx     ALLERGIES:  is allergic to soy allergy, amoxicillin-pot clavulanate, egg [egg-derived products], peanut-containing drug products, and shellfish-derived products.  MEDICATIONS:  Current Outpatient Medications  Medication Sig Dispense Refill   albuterol (VENTOLIN HFA) 108 (90 Base) MCG/ACT inhaler Inhale 2 puffs into the lungs 4 (four) times daily.     azelastine (ASTELIN) 0.1 % nasal spray USE 1 SPRAY IN EACH NOSTRIL TWICE A DAY AS NEEDED  5   Cholecalciferol (VITAMIN D) 50 MCG (2000 UT) tablet      desoximetasone (TOPICORT) 0.25 % cream Apply topically 2  (two) times daily.     dorzolamide-timolol (COSOPT) 22.3-6.8 MG/ML ophthalmic solution SMARTSIG:In Eye(s)     EPINEPHrine 0.3 mg/0.3 mL IJ SOAJ injection      ferrous sulfate 325 (65 FE) MG tablet Take 325 mg by mouth daily.     fluticasone (FLONASE) 50 MCG/ACT nasal spray 2 SPRAY IN EACH NOSTRIL ONCE A DAY NASALLY 30 DAY(S)  4   fluticasone-salmeterol (ADVAIR) 100-50 MCG/ACT AEPB 1 puff     latanoprost (XALATAN) 0.005 % ophthalmic solution SMARTSIG:In Eye(s)     loratadine (CLARITIN) 10 MG tablet Take 10 mg by mouth daily.     LOW-OGESTREL 0.3-30 MG-MCG tablet Take 1 tablet by mouth daily.     Multiple Vitamin (MULTIVITAMIN) capsule Take 1 capsule by mouth daily.     No current facility-administered medications for this visit.    REVIEW OF SYSTEMS:   Constitutional: Denies fevers, chills or abnormal night sweats Breast:  Denies any palpable lumps or discharge All other systems were reviewed with the patient and are negative.  PHYSICAL EXAMINATION: ECOG PERFORMANCE STATUS: 0 - Asymptomatic  Vitals:   01/26/23 1255  BP: (!) 181/75  Pulse: 79  Resp: 18  Temp: (!) 97.5 F (36.4 C)  SpO2: 100%   Filed Weights   01/26/23 1255  Weight: 205 lb 6.4 oz (93.2 kg)    GENERAL:alert, no distress and comfortable    LABORATORY DATA:  I have reviewed the data as listed Lab Results  Component Value Date   WBC 4.5 01/26/2023   HGB 12.6 01/26/2023   HCT 39.9 01/26/2023   MCV 71.0 (L) 01/26/2023   PLT 298 01/26/2023   Lab Results  Component Value Date   NA 138 01/26/2023   K 3.5 01/26/2023   CL 106 01/26/2023   CO2 27 01/26/2023    RADIOGRAPHIC STUDIES: I have personally reviewed the radiological reports and agreed with the findings in the report.  ASSESSMENT AND PLAN:  Malignant neoplasm of upper-outer quadrant of left breast in female, estrogen receptor positive 01/14/2023:Screening mammogram detected left breast mass UOQ 12:30 position: 1.1 cm, ultrasound biopsy revealed  poorly differentiated IDC grade 3, ER 100%, PR 3%, Ki-67 70%, HER2 negative, axillary lymph node biopsy: Benign concordant  Pathology and radiology counseling:Discussed with the patient, the details of pathology including the type of breast cancer,the clinical staging, the significance of ER, PR and HER-2/neu receptors and the implications for treatment. After reviewing the pathology in detail, we proceeded to discuss the different treatment options between surgery, radiation, chemotherapy, antiestrogen therapies.  Recommendations: 1. Breast conserving surgery followed by 2. Oncotype DX testing to determine if chemotherapy would be of any benefit followed by 3. Adjuvant radiation therapy followed by 4. Adjuvant antiestrogen therapy Genetic testing  Oncotype counseling: I discussed Oncotype DX test. I explained to the patient that this is a 21 gene panel to evaluate patient tumors DNA to calculate recurrence score. This  would help determine whether patient has high risk or low risk breast cancer. She understands that if her tumor was found to be high risk, she would benefit from systemic chemotherapy. If low risk, no need of chemotherapy.  Return to clinic after surgery to discuss final pathology report and then determine if Oncotype DX testing will need to be sent.   All questions were answered. The patient knows to call the clinic with any problems, questions or concerns.    Harriette Ohara, MD 01/26/23

## 2023-01-26 NOTE — Research (Signed)
Trial:  Ex[employer] 2021-05 - Specimen Collection Study to Evaluate Biomarkers in Subjects with Cancer   Patient Sue Beasley was identified by Dr. Lindi Adie as a potential candidate for the above listed study.  This Clinical Research Nurse met with [name], [id number], on 01/26/23 in a manner and location that ensures patient privacy to discuss participation in the above listed research study.  Patient is [name] .  A copy of the informed consent document with embedded HIPAA language was provided to the patient.  Patient reads, speaks, and understands [place].   Patient was provided with the business card of this Nurse and encouraged to contact the research team with any questions.  Approximately 5 minutes were spent with the patient reviewing the informed consent documents.  Patient was provided the option of taking informed consent documents home to review and was encouraged to review at their convenience with their support network, including other care providers. Patient took the consent documents home to review. Foye Spurling, BSN, RN, [place] Nurse II [phone] 01/26/2023

## 2023-01-26 NOTE — Progress Notes (Signed)
[facility] Psychosocial Distress Screening Spiritual Care  Met with Sue Beasley, her sister, and her close friend in [facility] to introduce [place] team/resources, reviewing distress screen per protocol.  The patient scored a 3 on the Psychosocial Distress Thermometer which indicates mild distress. Also assessed for distress and other psychosocial needs.      01/26/2023    3:55 PM  ONCBCN DISTRESS SCREENING  Screening Type Initial Screening  Distress experienced in past week (1-10) 3  Referral to support programs Yes    Chaplain and patient discussed common feelings and emotions when being diagnosed with cancer, and the importance of support during treatment.  Chaplain informed patient of the support team and support services at [facility].  Chaplain provided contact information and encouraged patient to call with any questions or concerns.  Sue Beasley reports good support and low distress, noting that she had been working on reducing her stress prior to her diagnosis.  Follow up needed: Yes.   Sue Beasley welcomes a follow-up call in ca two weeks.   [place], [place], [facility] Pager [phone] Voicemail [phone]

## 2023-01-27 ENCOUNTER — Encounter: Payer: Self-pay | Admitting: Genetic Counselor

## 2023-01-27 NOTE — Progress Notes (Signed)
REFERRING PROVIDER: Nicholas Lose, MD [address] [place],  [place] [address]  PRIMARY PROVIDER:  Biagio Borg, MD  PRIMARY REASON FOR VISIT:  1. Malignant neoplasm of upper-outer quadrant of left breast in female, estrogen receptor positive   2. Family history of pancreatic cancer   3. Family history of prostate cancer     HISTORY OF PRESENT ILLNESS:   Sue Beasley, a 52 y.o. female, was seen for a [place] cancer genetics consultation during the breast multidisciplinary clinic at the request of Dr. Lindi Adie due to a personal history of breast cancer.  Sue Beasley presents to clinic today to discuss the possibility of a hereditary predisposition to cancer, to discuss genetic testing, and to further clarify her future cancer risks, as well as potential cancer risks for family members.   In March 2024, at the age of 25, Sue Beasley was diagnosed with invasive ductal carcinoma of the left breast (ER+/PR+/HER2-). The treatment plan is pending.   CANCER HISTORY:  Oncology History  Malignant neoplasm of upper-outer quadrant of left breast in female, estrogen receptor positive  01/14/2023 Initial Diagnosis   Screening mammogram detected left breast mass UOQ 12:30 position: 1.1 cm, ultrasound biopsy revealed poorly differentiated IDC grade 3, ER 100%, PR 3%, Ki-67 70%, HER2 negative, axillary lymph node biopsy: Benign concordant   01/26/2023 Cancer Staging   Staging form: Breast, AJCC 8th Edition - Clinical: Stage IA (cT1c, cN0, cM0, G3, ER+, PR+, HER2-) - Signed by Nicholas Lose, MD on 01/26/2023 Histologic grading system: 3 grade system      Past Medical History:  Diagnosis Date   ALLERGIC RHINITIS 11/06/2009   Allergy    Anemia    ANEMIA-IRON DEFICIENCY 11/20/2009   Asthma    Eczema    Heart murmur    Irreducible umbilical hernia 0000000   Wears glasses     Past Surgical History:  Procedure Laterality Date   BREAST BIOPSY Left 01/14/2023   [place] LT BREAST BX W LOC DEV 1ST  LESION IMG BX [place] US GUIDE 01/14/2023 [facility] MAMMOGRAPHY   UMBILICAL HERNIA REPAIR  05/13/11     FAMILY HISTORY:  We obtained a detailed, 4-generation family history.  Significant diagnoses are listed below: Family History  Problem Relation Age of Onset   Colon polyps Sister    Esophageal cancer Maternal Uncle 89   Pancreatic cancer Cousin 32       mat female cousin   Prostate cancer Cousin 2       mat cousin     Sue Beasley is unaware of previous family history of genetic testing for hereditary cancer risks.  There is no reported Ashkenazi Jewish ancestry. There is no known consanguinity.  GENETIC COUNSELING ASSESSMENT: Sue Beasley is a 52 y.o. female with a personal history of cancer which is somewhat suggestive of a hereditary cancer syndrome and predisposition to cancer given her age of diagnosis. We, therefore, discussed and recommended the following at today's visit.   DISCUSSION: We discussed that 5 - 10% of cancer is hereditary, with most cases of hereditary breast cancer associated with mutations in BRCA1/2.  There are other genes that can be associated with hereditary breast cancer syndromes.  Type of cancer risk and level of risk are gene-specific. We discussed that testing is beneficial for several reasons including knowing how to follow individuals after completing their treatment, identifying whether potential treatment/surgery options would be beneficial, and understanding if other family members could be at risk for cancer and allowing them  to undergo genetic testing.   We reviewed the characteristics, features and inheritance patterns of hereditary cancer syndromes. We also discussed genetic testing, including the appropriate family members to test, the process of testing, insurance coverage and turn-around-time for results. We discussed the implications of a negative, positive and/or variant of uncertain significant result. In order to get genetic test results in a timely manner  so that Sue Beasley can use these genetic test results for surgical decisions, we recommended Sue Beasley pursue genetic testing for the [employer]. The BRCAPlus gene panel offered by [facility] and includes sequencing and rearrangement analysis for the following 13 genes: ATM, BARD1, BRCA1, BRCA2, CDH1, CHEK2, NF1, PALB2, PTEN, RAD51C, RAD51D, STK11 and TP53.   Once complete, we recommend Sue Beasley pursue reflex genetic testing to a more comprehensive gene panel.   The CustomNext-Cancer+RNAinsight panel offered by [facility] includes sequencing, rearrangement, and RNA analysis for the following 47 genes:  APC, ATM, AXIN2, BAP1, BARD1, BMPR1A, BRCA1, BRCA2, BRIP1, CDH1, CDK4, CDKN2A, CHEK2, CTNNA1, DICER1, EPCAM, FH, GREM1, HOXB13, KIT, MBD4, MEN1, MLH1, MSH2, MSH3, MSH6, MUTYH, NF1, NTHL1, PALB2, PDGFRA, PMS2, POLD1, POLE, PTEN, RAD51C, RAD51D, SDHA, SDHB, SDHC, SDHD, SMAD4, SMARCA4, STK11, TP53, TSC1, TSC2, VHL.     Based on Sue Beasley's personal history of cancer, she meets medical criteria for genetic testing. Despite that she meets criteria, she may still have an out of pocket cost. We discussed that if her out of pocket cost for testing is over $100 the laboratory should contact her to discuss self-pay prices, patient pay assistance programs, if applicable, and other billing options.   PLAN: After considering the risks, benefits, and limitations, Sue Beasley provided informed consent to pursue genetic testing and the blood sample was sent to [facility] for analysis of the BRCAPlus and CustomNext-Cancer +RNAinsight. Results should be available within approximately 1-2 weeks' time, at which point they will be disclosed by telephone to Sue Beasley, as will any additional recommendations warranted by these results. Sue Beasley will receive a summary of her genetic counseling visit and a copy of her results once available. This information will also be available in Epic.    Sue Beasley  questions were answered to her satisfaction today. Our contact information was provided should additional questions or concerns arise. Thank you for the referral and allowing [place] to share in the care of your patient.   Qasim Diveley M. Joette Catching, [place], [facility] Genetic Counselor [email or web address] (P) [phone]  The patient was seen for a total of 15 minutes in face-to-face genetic counseling.  She was accompanied by her sister and friend.  Dr. Lindi Adie was available to discuss this case as needed.  _______________________________________________________________________ For Office Staff:  Number of people involved in session: 3 Was an Intern/ student involved with case: no

## 2023-01-28 ENCOUNTER — Other Ambulatory Visit: Payer: Self-pay | Admitting: General Surgery

## 2023-01-28 DIAGNOSIS — Z170 Estrogen receptor positive status [ER+]: Secondary | ICD-10-CM

## 2023-01-31 ENCOUNTER — Telehealth: Payer: Self-pay | Admitting: Hematology and Oncology

## 2023-01-31 ENCOUNTER — Telehealth: Payer: Self-pay

## 2023-01-31 DIAGNOSIS — C50412 Malignant neoplasm of upper-outer quadrant of left female breast: Secondary | ICD-10-CM

## 2023-01-31 NOTE — Telephone Encounter (Signed)
Scheduled appointment per scheduling message. Patient is aware of the made appointment. 

## 2023-01-31 NOTE — Telephone Encounter (Signed)
Exact Sciences 2021-05 - Specimen Collection Study to Evaluate Biomarkers in Subjects with Cancer    Called the patient to see if they were interested in participating the exact science study. The patient stated that that have not had enough time to come to a conclusion. The patient wanted to call me back this week to confirm interest.   Felecia Jan, [facility] 01/31/2023 4:33 PM

## 2023-02-03 ENCOUNTER — Telehealth: Payer: Self-pay | Admitting: *Deleted

## 2023-02-03 ENCOUNTER — Encounter: Payer: Self-pay | Admitting: *Deleted

## 2023-02-03 NOTE — Telephone Encounter (Signed)
Spoke to pt concerning BMDC from 4.3.24. Denies questions or concerns regarding dx or treatment care plan. Pt discussed concerns with payment and financials. Referral sent to [facility] and [facility] for financial counseling. Encourage pt to call with needs. Received verbal understanding.

## 2023-02-04 ENCOUNTER — Telehealth: Payer: Self-pay | Admitting: Genetic Counselor

## 2023-02-04 ENCOUNTER — Encounter: Payer: Self-pay | Admitting: Genetic Counselor

## 2023-02-04 DIAGNOSIS — Z1379 Encounter for other screening for genetic and chromosomal anomalies: Secondary | ICD-10-CM | POA: Insufficient documentation

## 2023-02-04 NOTE — Telephone Encounter (Signed)
Revealed negative breast STAT panel.  Pan-cancer panel is pending.

## 2023-02-07 ENCOUNTER — Other Ambulatory Visit: Payer: Self-pay | Admitting: Emergency Medicine

## 2023-02-07 ENCOUNTER — Inpatient Hospital Stay: Payer: BC Managed Care – PPO | Admitting: Radiology

## 2023-02-07 ENCOUNTER — Other Ambulatory Visit: Payer: Self-pay

## 2023-02-07 ENCOUNTER — Inpatient Hospital Stay: Payer: BC Managed Care – PPO

## 2023-02-07 DIAGNOSIS — C50412 Malignant neoplasm of upper-outer quadrant of left female breast: Secondary | ICD-10-CM

## 2023-02-07 LAB — RESEARCH LABS

## 2023-02-07 NOTE — Therapy (Signed)
OUTPATIENT PHYSICAL THERAPY BREAST CANCER BASELINE EVALUATION   Patient Name: Sue Beasley MRN: [id number] DOB:[birth year], 52 y.o., female Today's Date: 02/08/2023  END OF SESSION:  PT End of Session - 02/08/23 1237     Visit Number 1    Number of Visits 2    Date for PT Re-Evaluation 03/22/23    PT Start Time 1200    PT Stop Time 1230    PT Time Calculation (min) 30 min    Activity Tolerance Patient tolerated treatment well    Behavior During Therapy [facility] for tasks assessed/performed             Past Medical History:  Diagnosis Date   ALLERGIC RHINITIS 11/06/2009   Allergy    Anemia    ANEMIA-IRON DEFICIENCY 11/20/2009   Asthma    Eczema    Heart murmur    Irreducible umbilical hernia 03/01/2011   Wears glasses    Past Surgical History:  Procedure Laterality Date   BREAST BIOPSY Left 01/14/2023   [place] LT BREAST BX W LOC DEV 1ST LESION IMG BX SPEC US GUIDE 01/14/2023 [facility]   UMBILICAL HERNIA REPAIR  05/13/11   Patient Active Problem List   Diagnosis Date Noted   Genetic testing 02/04/2023   Malignant neoplasm of upper-outer quadrant of left breast in female, estrogen receptor positive 01/24/2023   Low vitamin D level 11/19/2021   Incontinence 12/19/2020   Low mean corpuscular volume (MCV) 11/23/2020   Pain and swelling of wrist, left 03/14/2020   Hyperglycemia 11/09/2019   Uterine leiomyoma 11/09/2019   Acute upper respiratory infection 02/12/2019   Increased body mass index 11/02/2018   Obesity 08/02/2014   Heart murmur, systolic 08/01/2014   Encounter for well adult exam with abnormal findings 03/01/2011   Hyperlipidemia 11/20/2009   Iron deficiency anemia 11/20/2009   Allergic rhinitis 11/06/2009   Asthma 11/06/2009    PCP: Oliver Barre, MD  REFERRING PROVIDER: Dr. Pamelia Hoit  REFERRING DIAG: [phone] (ICD-10-CM) - Malignant neoplasm of upper-outer quadrant of left breast in female, estrogen receptor positive   THERAPY DIAG:  Malignant  neoplasm of upper-outer quadrant of left breast in female, estrogen receptor positive  Abnormal posture  Rationale for Evaluation and Treatment: Rehabilitation  ONSET DATE: 01/24/23  SUBJECTIVE:                                                                                                                                                                                           SUBJECTIVE STATEMENT: Patient reports she is here today to be seen by her medical team for her newly diagnosed left breast cancer.   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 It measures 1.1 cm and is located in the upper outer quadrant. It is ER/PR positive HER2 negative with a Ki67 of 70%. Will be having a left lumpectomy and SLNB on 02/14/23 with oncotype testing and radiation.   PATIENT GOALS:   reduce lymphedema risk and learn post op HEP.   PAIN:  Are you having pain? No  PRECAUTIONS: Active CA None  HAND DOMINANCE: left  WEIGHT BEARING RESTRICTIONS: No  FALLS:  Has patient fallen in last 6 months? 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   [employer]: running club ; I run around 3 miles   PRIOR LEVEL OF FUNCTION: Independent   OBJECTIVE:  COGNITION: Overall cognitive status: Within functional limits for tasks assessed    POSTURE:  Forward head and rounded shoulders posture  UPPER EXTREMITY AROM/PROM:  A/PROM RIGHT   eval   Shoulder extension 70  Shoulder flexion 165  Shoulder abduction 165  Shoulder internal rotation   Shoulder external rotation 115    (Blank rows = not tested)  A/PROM LEFT   eval  Shoulder extension 70  Shoulder flexion 165  Shoulder abduction 165  Shoulder internal rotation   Shoulder external rotation 110    (Blank rows = not tested)  CERVICAL AROM: All within normal limits:   UPPER EXTREMITY STRENGTH: WNL  LYMPHEDEMA ASSESSMENTS:   LANDMARK LEFT eval  10 cm  proximal to olecranon process 35.3  Olecranon process 26.2  10 cm proximal to ulnar styloid process 20.6  Just proximal to ulnar styloid process 15.7  Across hand at thumb web space 18.5  At base of 2nd digit 6.3  (Blank rows = not tested)  LANDMARK RIGHT eval  10 cm proximal to olecranon process 34  Olecranon process 25  10 cm proximal to ulnar styloid process 21.5  Just proximal to ulnar styloid process 15.6  Across hand at thumb web space 18.7  At base of 2nd digit 6.2  (Blank rows = not tested)  L-DEX LYMPHEDEMA SCREENING:  The patient was assessed using the L-Dex machine today to produce a lymphedema index baseline score. The patient will be reassessed on a regular basis (typically every 3 months) to obtain new L-Dex scores. If the score is > 6.5 points away from his/her baseline score indicating onset of subclinical lymphedema, it will be recommended to wear a compression garment for 4 weeks, 12 hours per day and then be reassessed. If the score continues to be > 6.5 points from baseline at reassessment, we will initiate lymphedema treatment. Assessing in this manner has a 95% rate of preventing clinically significant lymphedema.    QUICK DASH SURVEY: 0%  PATIENT EDUCATION:  Education details: Lymphedema risk reduction and post op shoulder/posture HEP Person educated: Patient Education method: Explanation, Demonstration, Handout Education comprehension: Patient verbalized understanding and returned demonstration  HOME EXERCISE PROGRAM: Patient was instructed today in a home exercise program today for post op shoulder range of motion. These included active assist shoulder flexion in sitting, scapular retraction, wall walking with shoulder abduction, and hands behind head external rotation.  She was encouraged to do these twice a day, holding 3 seconds and repeating 5 times when permitted by her physician.   ASSESSMENT:  CLINICAL IMPRESSION: Pt will benefit from a post op  PT reassessment to determine needs and from L-Dex screens every 3 months for 2 years to detect subclinical lymphedema.  Pt will benefit from skilled therapeutic intervention to improve on the following deficits: Decreased knowledge of precautions, impaired UE functional use, pain, decreased ROM, postural dysfunction.   PT treatment/interventions: ADL/self-care home management, pt/family education, therapeutic exercise  REHAB POTENTIAL: Excellent  CLINICAL DECISION MAKING: Stable/uncomplicated  EVALUATION COMPLEXITY: Low   GOALS: Goals reviewed with patient? YES  LONG TERM GOALS: (STG=LTG)    Name Target Date Goal status  1 Pt will be able to verbalize understanding of pertinent lymphedema risk reduction practices relevant to her dx specifically related to skin care.  Baseline:  No knowledge 02/08/2023 Achieved at eval  2 Pt will be able to return demo and/or verbalize understanding of the post op HEP related to regaining shoulder ROM. Baseline:  No knowledge 02/08/2023 Achieved at eval  3 Pt will be able to verbalize understanding of the importance of attending the post op After Breast CA Class for further lymphedema risk reduction education and therapeutic exercise.  Baseline:  No knowledge 02/08/2023 Achieved at eval  4 Pt will demo she has regained full shoulder ROM and function post operatively compared to baselines.  Baseline: See objective measurements taken today. 03/22/23     PLAN:  PT FREQUENCY/DURATION: EVAL and 1 follow up appointment.   PLAN FOR NEXT SESSION: will reassess 3-4 weeks post op to determine needs.   Patient will follow up at outpatient cancer rehab 3-4 weeks following surgery.  If the patient requires physical therapy at that time, a specific plan will be dictated and sent to the referring physician for approval. The patient was educated today on appropriate basic range of motion exercises to begin post operatively and the importance of attending the After  Breast Cancer class following surgery.  Patient was educated today on lymphedema risk reduction practices as it pertains to recommendations that will benefit the patient immediately following surgery.  She verbalized good understanding.    Physical Therapy Information for After Breast Cancer Surgery/Treatment:  Lymphedema is a swelling condition that you may be at risk for in your arm if you have lymph nodes removed from the armpit area.  After a sentinel node biopsy, the risk is approximately 5-9% and is higher after an axillary node dissection.  There is treatment available for this condition and it is not life-threatening.  Contact your physician or physical therapist with concerns. You may begin the 4 shoulder/posture exercises (see additional sheet) when permitted by your physician (typically a week after surgery).  If you have drains, you may need to wait until those are removed before beginning range of motion exercises.  A general recommendation is to not lift your arms above shoulder height until drains are removed.  These exercises should be done to your tolerance and gently.  This is not a "no pain/no gain" type of recovery so listen to your body and stretch into the range of motion that you can tolerate, stopping if you have pain.  If you are having immediate reconstruction, ask your plastic surgeon about doing exercises as he or she may want you to wait. We encourage you to attend the free one time ABC (After Breast Cancer) class offered by [facility].  You will learn information related to lymphedema risk, prevention and treatment and additional exercises to regain mobility following surgery.  You can call [phone] for more information.  This is offered the 1st and 3rd Monday of each month.  You only attend the class one time. While undergoing any medical procedure or treatment,  try to avoid blood pressure being taken or needle sticks from occurring on the arm on the  side of cancer.   This recommendation begins after surgery and continues for the rest of your life.  This may help reduce your risk of getting lymphedema (swelling in your arm). An excellent resource for those seeking information on lymphedema is the National Lymphedema Network's web site. It can be accessed at www.lymphnet.org If you notice swelling in your hand, arm or breast at any time following surgery (even if it is many years from now), please contact your doctor or physical therapist to discuss this.  Lymphedema can be treated at any time but it is easier for you if it is treated early on.  If you feel like your shoulder motion is not returning to normal in a reasonable amount of time, please contact your surgeon or physical therapist.  [facility] [phone]. [address], [place] [address]  ABC CLASS After Breast Cancer Class  After Breast Cancer Class is a specially designed exercise class to assist you in a safe recover after having breast cancer surgery.  In this class you will learn how to get back to full function whether your drains were just removed or if you had surgery a month ago.  This one-time class is held the 1st and 3rd Monday of every month from 11:00 a.m. until 12:00 noon virtually.  This class is FREE and space is limited. For more information or to register for the next available class, call [phone].  Class Goals  Understand specific stretches to improve the flexibility of you chest and shoulder. Learn ways to safely strengthen your upper body and improve your posture. Understand the warning signs of infection and why you may be at risk for an arm infection. Learn about Lymphedema and prevention.  ** You do not attend this class until after surgery.  Drains must be removed to participate  Patient was instructed today in a home exercise program today for post op shoulder range of motion. These included active  assist shoulder flexion in sitting, scapular retraction, wall walking with shoulder abduction, and hands behind head external rotation.  She was encouraged to do these twice a day, holding 3 seconds and repeating 5 times when permitted by her physician.    Idamae Lusher, PT 02/08/2023, 12:37 PM

## 2023-02-07 NOTE — Research (Signed)
[employer] 2021-05 - Specimen Collection Study to Evaluate Biomarkers in Subjects with Cancer    Patient Sue Beasley was identified by Dr. Pamelia Hoit  as a potential candidate for the above listed study.  This Clinical Research Coordinator met with Sue Beasley, [id number] on 02/07/23 in a manner and location that ensures patient privacy to discuss participation in the above listed research study.  Patient is Unaccompanied.  Patient was previously provided with informed consent documents.  Patient confirmed they have read the informed consent documents.  As outlined in the informed consent form, this Coordinator and Sue Beasley discussed the purpose of the research study, the investigational nature of the study, study procedures and requirements for study participation, potential risks and benefits of study participation, as well as alternatives to participation.  This study is not blinded or double-blinded. The patient understands participation is voluntary and they may withdraw from study participation at any time.  This study does not involve randomization.  This study does not involve an investigational drug or device. This study does not involve a placebo. Patient understands enrollment is pending full eligibility review.   Confidentiality and how the patient's information will be used as part of study participation were discussed.  Patient was informed there is not reimbursement provided for their time and effort spent on trial participation.  The patient is encouraged to discuss research study participation with their insurance provider to determine what costs they may incur as part of study participation, including research related injury.    All questions were answered to patient's satisfaction.  The informed consent with embedded HIPAA language was reviewed page by page.  The patient's mental and emotional status is appropriate to provide informed consent, and the patient  verbalizes an understanding of study participation.  Patient has agreed to participate in the above listed research study and has voluntarily signed the informed consent version [employer] Approved Version 14 2022 with embedded HIPAA language, version 3  on 02/07/23 at 1:00PM.  The patient was provided with a copy of the signed informed consent form with embedded HIPAA language for their reference.  No study specific procedures were obtained prior to the signing of the informed consent document.  Approximately 30 minutes were spent with the patient reviewing the informed consent documents.  Patient was not requested to complete a Release of Information form.    This Coordinator has reviewed this patient's inclusion and exclusion criteria and confirmed Sue Beasley is eligible for study participation.  Patient will continue with enrollment.  Menopausal status (women only): Sue Beasley is premenopausal  Eligibility confirmed by treating investigator, who also agrees that patient should proceed with enrollment.   Medical History:  High Blood Pressure  No Coronary Artery Disease No Lupus    No Rheumatoid Arthritis  No Diabetes   No      If yes, which type?      N/a Lynch Syndrome  No  Is the patient currently taking a magnesium supplement?   No If yes, dose and frequency? N/a Indication? N/a Start date? N/a  Does the patient have a personal history of cancer (greater than 5 years ago)?  No If yes, Cancer type and date of diagnosis?   N/a  Has this previous diagnosis been treated? N/a      If so, treatment type? N/a   Start and end dates of last treatment cycle? N/a  Does the patient have a family history of cancer in 1st or 2nd  degree relatives? Yes If yes, Relationship(s) and Cancer type(s)? Cousin-pancreatic, prostate Uncle- Throat Grandmother- Ovarian/   Does the patient have history of alcohol consumption? No   If yes, current or former? N/a If former, year stopped?  N/a Number of years? N/a Drinks per week? N/a  Does the patient have history of cigarette, cigar, pipe, or chewing tobacco use?  No  If yes, current for former? N/a If yes, type (Cigarette, cigar, pipe, and/or chewing tobacco)? N/a   If former, year stopped? N/a Number of years? N/a Packs/number/containers per day? N/a  After the consenting process was completed the patient was escorted to the lab by Suezanne Cheshire. She was given their gift card after blood collection and thanked for their participation.   Sue Beasley, [facility] 02/07/2023 3:20 PM

## 2023-02-07 NOTE — Research (Unsigned)
[employer] 2021-05 - Specimen Collection Study to Evaluate Biomarkers in Subjects with Cancer   SECOND ELIGIBILITY CHECK  This Nurse has reviewed this patient's inclusion and exclusion criteria as a second review and confirms Sue Beasley is eligible for study participation.  Patient may continue with enrollment.  Lurena Joiner '[employer]' Dairl Ponder, RN, BSN, [facility] Clinical Research Nurse I 02/07/23 1:31 PM

## 2023-02-08 ENCOUNTER — Encounter: Payer: Self-pay | Admitting: Rehabilitation

## 2023-02-08 ENCOUNTER — Encounter (HOSPITAL_BASED_OUTPATIENT_CLINIC_OR_DEPARTMENT_OTHER): Payer: Self-pay | Admitting: General Surgery

## 2023-02-08 ENCOUNTER — Other Ambulatory Visit: Payer: Self-pay

## 2023-02-08 ENCOUNTER — Ambulatory Visit: Payer: BC Managed Care – PPO | Attending: Hematology and Oncology | Admitting: Rehabilitation

## 2023-02-08 DIAGNOSIS — C50412 Malignant neoplasm of upper-outer quadrant of left female breast: Secondary | ICD-10-CM | POA: Diagnosis present

## 2023-02-08 DIAGNOSIS — Z170 Estrogen receptor positive status [ER+]: Secondary | ICD-10-CM | POA: Insufficient documentation

## 2023-02-08 DIAGNOSIS — R293 Abnormal posture: Secondary | ICD-10-CM | POA: Insufficient documentation

## 2023-02-09 ENCOUNTER — Encounter: Payer: Self-pay | Admitting: Internal Medicine

## 2023-02-09 ENCOUNTER — Encounter: Payer: Self-pay | Admitting: General Practice

## 2023-02-09 ENCOUNTER — Encounter (HOSPITAL_BASED_OUTPATIENT_CLINIC_OR_DEPARTMENT_OTHER)
Admission: RE | Admit: 2023-02-09 | Discharge: 2023-02-09 | Disposition: A | Discharge status: Home / Self Care | Payer: BC Managed Care – PPO | Source: Ambulatory Visit | Attending: General Surgery | Admitting: General Surgery

## 2023-02-09 DIAGNOSIS — Z01812 Encounter for preprocedural laboratory examination: Secondary | ICD-10-CM | POA: Insufficient documentation

## 2023-02-09 LAB — POCT PREGNANCY, URINE: Preg Test, Ur: NEGATIVE

## 2023-02-09 NOTE — Progress Notes (Signed)
[facility] Spiritual Care Note  Followed up with Sue Beasley by phone. She was upbeat and reports doing very well overall as she prepares for surgery; feeling God's "lining things up" for her--in terms of community support and connections--has been very meaningful.  Provided empathic listening, emotional support, witness to her story, and affirmation of strengths. We plan to follow up by phone in ca two weeks for a post-op check-in.   [address] [place], [place], [facility] Pager [phone] Voicemail [phone]

## 2023-02-09 NOTE — Progress Notes (Signed)
Ensure drink and CHG scrub given with instructions to pt. Pt verbalized understanding and for drink to be completed by 0600hrs morning of surgery. NPO otherwise.

## 2023-02-10 ENCOUNTER — Ambulatory Visit
Admission: RE | Admit: 2023-02-10 | Discharge: 2023-02-10 | Disposition: A | Discharge status: Home / Self Care | Payer: BC Managed Care – PPO | Source: Ambulatory Visit | Attending: General Surgery | Admitting: General Surgery

## 2023-02-10 DIAGNOSIS — C50412 Malignant neoplasm of upper-outer quadrant of left female breast: Secondary | ICD-10-CM

## 2023-02-10 HISTORY — PX: BREAST BIOPSY: SHX20

## 2023-02-13 NOTE — Anesthesia Preprocedure Evaluation (Signed)
Anesthesia Evaluation  Patient identified by MRN, date of birth, ID band Patient awake    Reviewed: Allergy & Precautions, NPO status , Patient's Chart, lab work & pertinent test results  History of Anesthesia Complications Negative for: history of anesthetic complications  Airway Mallampati: II  TM Distance: >3 FB Neck ROM: Full    Dental no notable dental hx.    Pulmonary asthma    Pulmonary exam normal        Cardiovascular negative cardio ROS Normal cardiovascular exam     Neuro/Psych negative neurological ROS     GI/Hepatic negative GI ROS, Neg liver ROS,,,  Endo/Other  BMI 39  Renal/GU negative Renal ROS     Musculoskeletal negative musculoskeletal ROS (+)    Abdominal   Peds  Hematology  (+) Blood dyscrasia, anemia   Anesthesia Other Findings Left breast ca  Reproductive/Obstetrics negative OB ROS                              Anesthesia Physical Anesthesia Plan  ASA: 2  Anesthesia Plan: General   Post-op Pain Management: Tylenol PO (pre-op)*, Toradol IV (intra-op)* and Regional block*   Induction: Intravenous  PONV Risk Score and Plan: 3 and Ondansetron, Dexamethasone, Treatment may vary due to age or medical condition, Scopolamine patch - Pre-op and Midazolam  Airway Management Planned: LMA  Additional Equipment: None  Intra-op Plan:   Post-operative Plan: Extubation in OR  Informed Consent: I have reviewed the patients History and Physical, chart, labs and discussed the procedure including the risks, benefits and alternatives for the proposed anesthesia with the patient or authorized representative who has indicated his/her understanding and acceptance.     Dental advisory given  Plan Discussed with: CRNA  Anesthesia Plan Comments:         Anesthesia Quick Evaluation

## 2023-02-14 ENCOUNTER — Other Ambulatory Visit: Payer: Self-pay

## 2023-02-14 ENCOUNTER — Ambulatory Visit (HOSPITAL_BASED_OUTPATIENT_CLINIC_OR_DEPARTMENT_OTHER): Payer: BC Managed Care – PPO | Admitting: Anesthesiology

## 2023-02-14 ENCOUNTER — Ambulatory Visit
Admission: RE | Admit: 2023-02-14 | Discharge: 2023-02-14 | Disposition: A | Discharge status: Home / Self Care | Payer: BC Managed Care – PPO | Source: Ambulatory Visit | Attending: General Surgery | Admitting: General Surgery

## 2023-02-14 ENCOUNTER — Encounter (HOSPITAL_BASED_OUTPATIENT_CLINIC_OR_DEPARTMENT_OTHER): Payer: Self-pay | Admitting: General Surgery

## 2023-02-14 ENCOUNTER — Ambulatory Visit (HOSPITAL_BASED_OUTPATIENT_CLINIC_OR_DEPARTMENT_OTHER)
Admission: RE | Admit: 2023-02-14 | Discharge: 2023-02-14 | Disposition: A | Discharge status: Home / Self Care | Payer: BC Managed Care – PPO | Attending: General Surgery | Admitting: General Surgery

## 2023-02-14 ENCOUNTER — Encounter (HOSPITAL_BASED_OUTPATIENT_CLINIC_OR_DEPARTMENT_OTHER)
Admission: RE | Disposition: A | Discharge status: Home / Self Care | Payer: Self-pay | Source: Home / Self Care | Attending: General Surgery

## 2023-02-14 DIAGNOSIS — Z170 Estrogen receptor positive status [ER+]: Secondary | ICD-10-CM | POA: Insufficient documentation

## 2023-02-14 DIAGNOSIS — C50412 Malignant neoplasm of upper-outer quadrant of left female breast: Secondary | ICD-10-CM | POA: Insufficient documentation

## 2023-02-14 DIAGNOSIS — Z01818 Encounter for other preprocedural examination: Secondary | ICD-10-CM

## 2023-02-14 HISTORY — PX: BREAST LUMPECTOMY WITH RADIOACTIVE SEED AND SENTINEL LYMPH NODE BIOPSY: SHX6550

## 2023-02-14 LAB — POCT PREGNANCY, URINE: Preg Test, Ur: NEGATIVE

## 2023-02-14 SURGERY — BREAST LUMPECTOMY WITH RADIOACTIVE SEED AND SENTINEL LYMPH NODE BIOPSY
Anesthesia: General | Site: Breast | Laterality: Left

## 2023-02-14 MED ORDER — BUPIVACAINE HCL (PF) 0.25 % IJ SOLN
INTRAMUSCULAR | Status: DC | PRN
  Administered 2023-02-14: 5 mL

## 2023-02-14 MED ORDER — ACETAMINOPHEN 500 MG PO TABS
1000.0000 mg | ORAL_TABLET | ORAL | Status: AC
  Administered 2023-02-14: 1000 mg via ORAL

## 2023-02-14 MED ORDER — OXYCODONE HCL 5 MG PO TABS
ORAL_TABLET | ORAL | Status: AC
  Filled 2023-02-14: qty 1

## 2023-02-14 MED ORDER — LIDOCAINE HCL (CARDIAC) PF 100 MG/5ML IV SOSY
PREFILLED_SYRINGE | INTRAVENOUS | Status: DC | PRN
  Administered 2023-02-14: 100 mg via INTRAVENOUS

## 2023-02-14 MED ORDER — FENTANYL CITRATE (PF) 100 MCG/2ML IJ SOLN
INTRAMUSCULAR | Status: AC
  Filled 2023-02-14: qty 2

## 2023-02-14 MED ORDER — LIDOCAINE 2% (20 MG/ML) 5 ML SYRINGE
INTRAMUSCULAR | Status: AC
  Filled 2023-02-14: qty 5

## 2023-02-14 MED ORDER — ENSURE PRE-SURGERY PO LIQD: 296.0000 mL | Freq: Once | ORAL | Status: DC

## 2023-02-14 MED ORDER — ONDANSETRON HCL 4 MG/2ML IJ SOLN
INTRAMUSCULAR | Status: DC | PRN
  Administered 2023-02-14: 4 mg via INTRAVENOUS

## 2023-02-14 MED ORDER — ACETAMINOPHEN 500 MG PO TABS: 1000.0000 mg | ORAL_TABLET | Freq: Once | ORAL | Status: DC

## 2023-02-14 MED ORDER — PROPOFOL 10 MG/ML IV BOLUS
INTRAVENOUS | Status: AC
  Filled 2023-02-14: qty 20

## 2023-02-14 MED ORDER — MAGTRACE LYMPHATIC TRACER
INTRAMUSCULAR | Status: DC | PRN
  Administered 2023-02-14: 2 mL via INTRAMUSCULAR

## 2023-02-14 MED ORDER — MIDAZOLAM HCL 2 MG/2ML IJ SOLN
2.0000 mg | Freq: Once | INTRAMUSCULAR | Status: AC
  Administered 2023-02-14: 1 mg via INTRAVENOUS

## 2023-02-14 MED ORDER — KETOROLAC TROMETHAMINE 30 MG/ML IJ SOLN
INTRAMUSCULAR | Status: AC
  Filled 2023-02-14: qty 1

## 2023-02-14 MED ORDER — FENTANYL CITRATE (PF) 100 MCG/2ML IJ SOLN
INTRAMUSCULAR | Status: DC | PRN
  Administered 2023-02-14 (×2): 50 ug via INTRAVENOUS

## 2023-02-14 MED ORDER — BUPIVACAINE LIPOSOME 1.3 % IJ SUSP
INTRAMUSCULAR | Status: DC | PRN
  Administered 2023-02-14: 10 mL via PERINEURAL

## 2023-02-14 MED ORDER — CEFAZOLIN SODIUM-DEXTROSE 2-4 GM/100ML-% IV SOLN
INTRAVENOUS | Status: AC
  Filled 2023-02-14: qty 100

## 2023-02-14 MED ORDER — MIDAZOLAM HCL 2 MG/2ML IJ SOLN
INTRAMUSCULAR | Status: AC
  Filled 2023-02-14: qty 2

## 2023-02-14 MED ORDER — SCOPOLAMINE 1 MG/3DAYS TD PT72
MEDICATED_PATCH | TRANSDERMAL | Status: AC
  Filled 2023-02-14: qty 1

## 2023-02-14 MED ORDER — OXYCODONE HCL 5 MG/5ML PO SOLN: 5.0000 mg | Freq: Once | ORAL | Status: AC | PRN

## 2023-02-14 MED ORDER — OXYCODONE HCL 5 MG PO TABS
5.0000 mg | ORAL_TABLET | Freq: Once | ORAL | Status: AC | PRN
  Administered 2023-02-14: 5 mg via ORAL

## 2023-02-14 MED ORDER — CEFAZOLIN SODIUM-DEXTROSE 2-4 GM/100ML-% IV SOLN
2.0000 g | INTRAVENOUS | Status: AC
  Administered 2023-02-14: 2 g via INTRAVENOUS

## 2023-02-14 MED ORDER — DEXAMETHASONE SODIUM PHOSPHATE 4 MG/ML IJ SOLN
INTRAMUSCULAR | Status: DC | PRN
  Administered 2023-02-14: 5 mg via INTRAVENOUS

## 2023-02-14 MED ORDER — AMISULPRIDE (ANTIEMETIC) 5 MG/2ML IV SOLN: 10.0000 mg | Freq: Once | INTRAVENOUS | Status: DC | PRN

## 2023-02-14 MED ORDER — LACTATED RINGERS IV SOLN: INTRAVENOUS | Status: DC

## 2023-02-14 MED ORDER — FENTANYL CITRATE (PF) 100 MCG/2ML IJ SOLN: 25.0000 ug | INTRAMUSCULAR | Status: DC | PRN

## 2023-02-14 MED ORDER — CHLORHEXIDINE GLUCONATE CLOTH 2 % EX PADS: 6.0000 | MEDICATED_PAD | Freq: Once | CUTANEOUS | Status: DC

## 2023-02-14 MED ORDER — SCOPOLAMINE 1 MG/3DAYS TD PT72
1.0000 | MEDICATED_PATCH | Freq: Once | TRANSDERMAL | Status: DC
  Administered 2023-02-14: 1.5 mg via TRANSDERMAL

## 2023-02-14 MED ORDER — MIDAZOLAM HCL 5 MG/5ML IJ SOLN
INTRAMUSCULAR | Status: DC | PRN
  Administered 2023-02-14: 2 mg via INTRAVENOUS

## 2023-02-14 MED ORDER — PROPOFOL 10 MG/ML IV BOLUS
INTRAVENOUS | Status: DC | PRN
  Administered 2023-02-14: 200 mg via INTRAVENOUS

## 2023-02-14 MED ORDER — PHENYLEPHRINE HCL (PRESSORS) 10 MG/ML IV SOLN
INTRAVENOUS | Status: DC | PRN
  Administered 2023-02-14: 80 ug via INTRAVENOUS

## 2023-02-14 MED ORDER — KETOROLAC TROMETHAMINE 30 MG/ML IJ SOLN
INTRAMUSCULAR | Status: DC | PRN
  Administered 2023-02-14: 15 mg via INTRAVENOUS

## 2023-02-14 MED ORDER — PHENYLEPHRINE 80 MCG/ML (10ML) SYRINGE FOR IV PUSH (FOR BLOOD PRESSURE SUPPORT)
PREFILLED_SYRINGE | INTRAVENOUS | Status: AC
  Filled 2023-02-14: qty 10

## 2023-02-14 MED ORDER — BUPIVACAINE-EPINEPHRINE (PF) 0.5% -1:200000 IJ SOLN
INTRAMUSCULAR | Status: DC | PRN
  Administered 2023-02-14: 20 mL via PERINEURAL

## 2023-02-14 MED ORDER — DEXAMETHASONE SODIUM PHOSPHATE 10 MG/ML IJ SOLN
INTRAMUSCULAR | Status: AC
  Filled 2023-02-14: qty 1

## 2023-02-14 MED ORDER — ACETAMINOPHEN 500 MG PO TABS
ORAL_TABLET | ORAL | Status: AC
  Filled 2023-02-14: qty 2

## 2023-02-14 MED ORDER — FENTANYL CITRATE (PF) 100 MCG/2ML IJ SOLN
100.0000 ug | Freq: Once | INTRAMUSCULAR | Status: AC
  Administered 2023-02-14: 50 ug via INTRAVENOUS

## 2023-02-14 MED ORDER — TRAMADOL HCL 50 MG PO TABS: 50.0000 mg | ORAL_TABLET | Freq: Four times a day (QID) | ORAL | 0 refills | Status: DC | PRN

## 2023-02-14 MED ORDER — ONDANSETRON HCL 4 MG/2ML IJ SOLN
INTRAMUSCULAR | Status: AC
  Filled 2023-02-14: qty 2

## 2023-02-14 SURGICAL SUPPLY — 52 items
ADH SKN CLS APL DERMABOND .7 (GAUZE/BANDAGES/DRESSINGS) ×1
APL PRP STRL LF DISP 70% ISPRP (MISCELLANEOUS) ×1
APPLIER CLIP 9.375 MED OPEN (MISCELLANEOUS) ×1
APR CLP MED 9.3 20 MLT OPN (MISCELLANEOUS) ×1
BLADE SURG 15 STRL LF DISP TIS (BLADE) ×1 IMPLANT
BLADE SURG 15 STRL SS (BLADE) ×1
CANISTER SUC SOCK COL 7IN (MISCELLANEOUS) IMPLANT
CANISTER SUCT 1200ML W/VALVE (MISCELLANEOUS) IMPLANT
CHLORAPREP W/TINT 26 (MISCELLANEOUS) ×1 IMPLANT
CLIP APPLIE 9.375 MED OPEN (MISCELLANEOUS) IMPLANT
CLIP TI WIDE RED SMALL 6 (CLIP) ×1 IMPLANT
COVER BACK TABLE 60X90IN (DRAPES) ×1 IMPLANT
COVER MAYO STAND STRL (DRAPES) ×1 IMPLANT
COVER PROBE CYLINDRICAL 5X96 (MISCELLANEOUS) ×1 IMPLANT
DERMABOND ADVANCED .7 DNX12 (GAUZE/BANDAGES/DRESSINGS) ×1 IMPLANT
DRAPE LAPAROSCOPIC ABDOMINAL (DRAPES) ×1 IMPLANT
DRAPE UTILITY XL STRL (DRAPES) ×1 IMPLANT
ELECT COATED BLADE 2.86 ST (ELECTRODE) ×1 IMPLANT
ELECT REM PT RETURN 9FT ADLT (ELECTROSURGICAL) ×1
ELECTRODE REM PT RTRN 9FT ADLT (ELECTROSURGICAL) ×1 IMPLANT
GLOVE BIO SURGEON STRL SZ7 (GLOVE) ×2 IMPLANT
GLOVE BIOGEL PI IND STRL 7.5 (GLOVE) ×1 IMPLANT
GOWN STRL REUS W/ TWL LRG LVL3 (GOWN DISPOSABLE) ×2 IMPLANT
GOWN STRL REUS W/TWL LRG LVL3 (GOWN DISPOSABLE) ×2
HEMOSTAT ARISTA ABSORB 3G PWDR (HEMOSTASIS) IMPLANT
KIT MARKER MARGIN INK (KITS) ×1 IMPLANT
NDL HYPO 25X1 1.5 SAFETY (NEEDLE) ×1 IMPLANT
NDL SAFETY ECLIP 18X1.5 (MISCELLANEOUS) IMPLANT
NEEDLE HYPO 25X1 1.5 SAFETY (NEEDLE) ×1 IMPLANT
NS IRRIG 1000ML POUR BTL (IV SOLUTION) IMPLANT
PACK BASIN DAY SURGERY FS (CUSTOM PROCEDURE TRAY) ×1 IMPLANT
PENCIL SMOKE EVACUATOR (MISCELLANEOUS) ×1 IMPLANT
RETRACTOR ONETRAX LX 90X20 (MISCELLANEOUS) IMPLANT
SLEEVE SCD COMPRESS KNEE MED (STOCKING) ×1 IMPLANT
SPIKE FLUID TRANSFER (MISCELLANEOUS) IMPLANT
SPONGE T-LAP 4X18 ~~LOC~~+RFID (SPONGE) ×1 IMPLANT
STRIP CLOSURE SKIN 1/2X4 (GAUZE/BANDAGES/DRESSINGS) ×1 IMPLANT
SUT ETHILON 2 0 FS 18 (SUTURE) IMPLANT
SUT MNCRL AB 4-0 PS2 18 (SUTURE) ×1 IMPLANT
SUT MON AB 5-0 PS2 18 (SUTURE) IMPLANT
SUT SILK 2 0 SH (SUTURE) IMPLANT
SUT VIC AB 2-0 SH 27 (SUTURE) ×1
SUT VIC AB 2-0 SH 27XBRD (SUTURE) ×1 IMPLANT
SUT VIC AB 3-0 SH 27 (SUTURE) ×1
SUT VIC AB 3-0 SH 27X BRD (SUTURE) ×1 IMPLANT
SUT VIC AB 5-0 PS2 18 (SUTURE) IMPLANT
SYR CONTROL 10ML LL (SYRINGE) ×1 IMPLANT
TOWEL GREEN STERILE FF (TOWEL DISPOSABLE) ×1 IMPLANT
TRACER MAGTRACE VIAL (MISCELLANEOUS) IMPLANT
TRAY FAXITRON CT DISP (TRAY / TRAY PROCEDURE) ×1 IMPLANT
TUBE CONNECTING 20X1/4 (TUBING) IMPLANT
YANKAUER SUCT BULB TIP NO VENT (SUCTIONS) IMPLANT

## 2023-02-14 NOTE — H&P (Signed)
51 yof with screening detected left breast mass. She has B density breast tissue. She has an uoq mass that is 1.1x1.1x1 cm mass on [place]. Axilla had one node biopsied that is benign concordant for cortical thickness. Biopsy of breast mass is a grade II IDC that is 100% er pos, 3% pr pos, her 2 negative and Ki is 70%. She has no mass or dc. She is here to discuss options  Review of Systems: A complete review of systems was obtained from the patient. I have reviewed this information and discussed as appropriate with the patient. See HPI as well for other ROS.  Review of Systems  Genitourinary: Positive for urgency.  All other systems reviewed and are negative.  Medical History: Past Medical History:  Diagnosis Date  Anemia  Asthma, unspecified asthma severity, unspecified whether complicated, unspecified whether persistent (HHS-HCC)  History of cancer  Hyperlipidemia   There is no problem list on file for this patient.  Past Surgical History:  Procedure Laterality Date  HERNIA REPAIR  Umbilical    Allergies  Allergen Reactions  Soy Nausea  Egg Derived Other (See Comments)  Peanut Other (See Comments)  Shellfish Containing Products Other (See Comments)  Amoxicillin Hives and Rash   Current Outpatient Medications on File Prior to Visit  Medication Sig Dispense Refill  cholecalciferol (VITAMIN D3) 2,000 unit tablet  dorzolamide-timoloL (COSOPT) 22.3-6.8 mg/mL ophthalmic solution Place 1 drop into both eyes 2 (two) times daily  albuterol MDI, PROVENTIL, VENTOLIN, PROAIR, HFA 90 mcg/actuation inhaler 2 PUFFS AS NEEDED INHALATION EVERY 4-6 HOURS AS NEEDED FOR COUGH/WHEEZE  azelastine (ASTELIN) 137 mcg nasal spray USE 1 SPRAY IN EACH NOSTRIL TWICE DAILY AS NEEDED 90  desoximetasone (TOPICORT) 0.25 % cream APPLY TO AFFECTED AREA TWICE A DAY AS NEEDED EXTERNAL TWICE A DAY 30 DAYS  DYMISTA 137-50 mcg/spray Spry  ferrous sulfate 325 (65 FE) MG tablet Take by mouth  fluticasone  propion-salmeteroL (ADVAIR DISKUS) 100-50 mcg/dose diskus inhaler 1 puff  loratadine (CLARITIN) 10 mg tablet Take 1 tablet every day by oral route.  norethindrone-ethinyl estradiol (NORTREL 1/35, 28,) 1-35 mg-mcg tablet Alyacen 1/35 (28) 1 mg-35 mcg tablet   Family History  Problem Relation Age of Onset  Hyperlipidemia (Elevated cholesterol) Mother  High blood pressure (Hypertension) Father  Diabetes Father  Esophageal cancer Maternal Uncle   Social History   Tobacco Use  Smoking Status Never  Smokeless Tobacco Never  Marital status: Single  Tobacco Use  Smoking status: Never  Smokeless tobacco: Never  Substance and Sexual Activity  Alcohol use: Never  Drug use: Never   Objective:   Physical Exam Vitals reviewed.  Constitutional:  Appearance: Normal appearance.  Chest:  Breasts: Right: No inverted nipple, mass or nipple discharge.  Left: No inverted nipple, mass or nipple discharge.  Lymphadenopathy:  Upper Body:  Right upper body: No supraclavicular or axillary adenopathy.  Left upper body: No supraclavicular or axillary adenopathy.  Neurological:  Mental Status: She is alert.    Assessment and Plan:   Carcinoma of upper-outer quadrant of left breast in female, estrogen receptor positive (CMS/HHS-HCC)  Left breast seed guided lumpectomy, left axillary sentinel node biopsy  We discussed the staging and pathophysiology of breast cancer. We discussed all of the different options for treatment for breast cancer including surgery, chemotherapy, radiation therapy, Herceptin, and antiestrogen therapy.  We discussed a sentinel lymph node biopsy as she does not appear to having lymph node involvement right now. We discussed the performance of that with injection  of Magtrace. Discussed small risk of skin discoloration. We discussed that there is a chance of having a positive node with a sentinel lymph node biopsy and we will await the permanent pathology to make any other  first further decisions in terms of her treatment. We discussed up to a 5% risk lifetime of chronic shoulder pain as well as lymphedema associated with a sentinel lymph node biopsy.  We discussed the options for treatment of the breast cancer which included lumpectomy versus a mastectomy. We discussed the performance of the lumpectomy with radioactive seed placement. We discussed a 5-10% chance of a positive margin requiring reexcision in the operating room. We also discussed that she will likely need radiation therapy if she undergoes lumpectomy. We discussed mastectomy and the postoperative care for that as well. Mastectomy can be followed by reconstruction. The decision for lumpectomy vs mastectomy has no impact on decision for chemotherapy. Most mastectomy patients will not need radiation therapy. We discussed that there is no difference in her survival whether she undergoes lumpectomy with radiation therapy or antiestrogen therapy versus a mastectomy. There is also no real difference between her recurrence in the breast.  We discussed the risks of operation including bleeding, infection, possible reoperation. She understands her further therapy will be based on what her stages at the time of her operation.

## 2023-02-14 NOTE — Anesthesia Postprocedure Evaluation (Signed)
Anesthesia Post Note  Patient: [name]  Procedure(s) Performed: LEFT BREAST LUMPECTOMY WITH RADIOACTIVE SEED AND AXILLARY SENTINEL LYMPH NODE BIOPSY (Left: Breast)     Patient location during evaluation: PACU Anesthesia Type: General Level of consciousness: awake and alert Pain management: pain level controlled Vital Signs Assessment: post-procedure vital signs reviewed and stable Respiratory status: spontaneous breathing, nonlabored ventilation and respiratory function stable Cardiovascular status: blood pressure returned to baseline Postop Assessment: no apparent nausea or vomiting Anesthetic complications: no   No notable events documented.  Last Vitals:  Vitals:   02/14/23 1045 02/14/23 1100  BP: 135/74 132/72  Pulse: 71 66  Resp: (!) 24 (!) 21  Temp:    SpO2: 94% 97%    Last Pain:  Vitals:   02/14/23 1045  TempSrc:   PainSc: 3                  Shanda Howells

## 2023-02-14 NOTE — Interval H&P Note (Signed)
History and Physical Interval Note:  02/14/2023 8:25 AM  Sue Beasley  has presented today for surgery, with the diagnosis of LEFT BREAST CANCER.  The various methods of treatment have been discussed with the patient and family. After consideration of risks, benefits and other options for treatment, the patient has consented to  Procedure(s): LEFT BREAST LUMPECTOMY WITH RADIOACTIVE SEED AND AXILLARY SENTINEL LYMPH NODE BIOPSY (Left) as a surgical intervention.  The patient's history has been reviewed, patient examined, no change in status, stable for surgery.  I have reviewed the patient's chart and labs.  Questions were answered to the patient's satisfaction.     Sue Beasley

## 2023-02-14 NOTE — Anesthesia Procedure Notes (Signed)
Procedure Name: LMA Insertion Date/Time: 02/14/2023 9:05 AM  Performed by: Cleda Clarks, CRNAPre-anesthesia Checklist: Patient identified, Emergency Drugs available, Suction available and Patient being monitored Patient Re-evaluated:Patient Re-evaluated prior to induction Oxygen Delivery Method: Circle system utilized Preoxygenation: Pre-oxygenation with 100% oxygen Induction Type: IV induction Ventilation: Mask ventilation without difficulty LMA: LMA inserted LMA Size: 4.0 Number of attempts: 1 Placement Confirmation: positive ETCO2 Tube secured with: Tape Dental Injury: Teeth and Oropharynx as per pre-operative assessment

## 2023-02-14 NOTE — H&P (View-Only) (Signed)
51 yof with screening detected left breast mass. She has B density breast tissue. She has an uoq mass that is 1.1x1.1x1 cm mass on [place]. Axilla had one node biopsied that is benign concordant for cortical thickness. Biopsy of breast mass is a grade II IDC that is 100% er pos, 3% pr pos, her 2 negative and Ki is 70%. She has no mass or dc. She is here to discuss options  Review of Systems: A complete review of systems was obtained from the patient. I have reviewed this information and discussed as appropriate with the patient. See HPI as well for other ROS.  Review of Systems  Genitourinary: Positive for urgency.  All other systems reviewed and are negative.  Medical History: Past Medical History:  Diagnosis Date  Anemia  Asthma, unspecified asthma severity, unspecified whether complicated, unspecified whether persistent (HHS-HCC)  History of cancer  Hyperlipidemia   There is no problem list on file for this patient.  Past Surgical History:  Procedure Laterality Date  HERNIA REPAIR  Umbilical    Allergies  Allergen Reactions  Soy Nausea  Egg Derived Other (See Comments)  Peanut Other (See Comments)  Shellfish Containing Products Other (See Comments)  Amoxicillin Hives and Rash   Current Outpatient Medications on File Prior to Visit  Medication Sig Dispense Refill  cholecalciferol (VITAMIN D3) 2,000 unit tablet  dorzolamide-timoloL (COSOPT) 22.3-6.8 mg/mL ophthalmic solution Place 1 drop into both eyes 2 (two) times daily  albuterol MDI, PROVENTIL, VENTOLIN, PROAIR, HFA 90 mcg/actuation inhaler 2 PUFFS AS NEEDED INHALATION EVERY 4-6 HOURS AS NEEDED FOR COUGH/WHEEZE  azelastine (ASTELIN) 137 mcg nasal spray USE 1 SPRAY IN EACH NOSTRIL TWICE DAILY AS NEEDED 90  desoximetasone (TOPICORT) 0.25 % cream APPLY TO AFFECTED AREA TWICE A DAY AS NEEDED EXTERNAL TWICE A DAY 30 DAYS  DYMISTA 137-50 mcg/spray Spry  ferrous sulfate 325 (65 FE) MG tablet Take by mouth  fluticasone  propion-salmeteroL (ADVAIR DISKUS) 100-50 mcg/dose diskus inhaler 1 puff  loratadine (CLARITIN) 10 mg tablet Take 1 tablet every day by oral route.  norethindrone-ethinyl estradiol (NORTREL 1/35, 28,) 1-35 mg-mcg tablet Alyacen 1/35 (28) 1 mg-35 mcg tablet   Family History  Problem Relation Age of Onset  Hyperlipidemia (Elevated cholesterol) Mother  High blood pressure (Hypertension) Father  Diabetes Father  Esophageal cancer Maternal Uncle   Social History   Tobacco Use  Smoking Status Never  Smokeless Tobacco Never  Marital status: Single  Tobacco Use  Smoking status: Never  Smokeless tobacco: Never  Substance and Sexual Activity  Alcohol use: Never  Drug use: Never   Objective:   Physical Exam Vitals reviewed.  Constitutional:  Appearance: Normal appearance.  Chest:  Breasts: Right: No inverted nipple, mass or nipple discharge.  Left: No inverted nipple, mass or nipple discharge.  Lymphadenopathy:  Upper Body:  Right upper body: No supraclavicular or axillary adenopathy.  Left upper body: No supraclavicular or axillary adenopathy.  Neurological:  Mental Status: She is alert.    Assessment and Plan:   Carcinoma of upper-outer quadrant of left breast in female, estrogen receptor positive (CMS/HHS-HCC)  Left breast seed guided lumpectomy, left axillary sentinel node biopsy  We discussed the staging and pathophysiology of breast cancer. We discussed all of the different options for treatment for breast cancer including surgery, chemotherapy, radiation therapy, Herceptin, and antiestrogen therapy.  We discussed a sentinel lymph node biopsy as she does not appear to having lymph node involvement right now. We discussed the performance of that with injection   of Magtrace. Discussed small risk of skin discoloration. We discussed that there is a chance of having a positive node with a sentinel lymph node biopsy and we will await the permanent pathology to make any other  first further decisions in terms of her treatment. We discussed up to a 5% risk lifetime of chronic shoulder pain as well as lymphedema associated with a sentinel lymph node biopsy.  We discussed the options for treatment of the breast cancer which included lumpectomy versus a mastectomy. We discussed the performance of the lumpectomy with radioactive seed placement. We discussed a 5-10% chance of a positive margin requiring reexcision in the operating room. We also discussed that she will likely need radiation therapy if she undergoes lumpectomy. We discussed mastectomy and the postoperative care for that as well. Mastectomy can be followed by reconstruction. The decision for lumpectomy vs mastectomy has no impact on decision for chemotherapy. Most mastectomy patients will not need radiation therapy. We discussed that there is no difference in her survival whether she undergoes lumpectomy with radiation therapy or antiestrogen therapy versus a mastectomy. There is also no real difference between her recurrence in the breast.  We discussed the risks of operation including bleeding, infection, possible reoperation. She understands her further therapy will be based on what her stages at the time of her operation.  

## 2023-02-14 NOTE — Op Note (Signed)
Preoperative diagnosis: Clinical stage I left breast cancer Postoperative diagnosis: Same as above Procedure: 1.  Left breast radioactive seed guided lumpectomy 2.  Injection of mag trace for sentinel lymph node identification 3.  Left deep axillary sentinel lymph node biopsy Surgeon: Dr. Harden Mo Anesthesia: General with a pectoral block Estimated blood loss: Minimal Complications: None Drains: None Specimens: 1.  Left breast lumpectomy containing seed and clip marked with paint 2.  Additional medial, inferior, posterior margins left breast marked short superior, long lateral, double deep 3.  Left deep axillary sentinel lymph nodes with highest count of 565 Sponge needle count was correct completion Disposition recovery stable condition  Indications:51 yof with screening detected left breast mass. She has B density breast tissue. She has an uoq mass that is 1.1x1.1x1 cm mass on [place]. Axilla had one node biopsied that is benign concordant for cortical thickness. Biopsy of breast mass is a grade II IDC that is 100% er pos, 3% pr pos, her 2 negative and Ki is 70%. She has no mass or dc.  We discussed proceeding with lumpectomy and sentinel lymph node biopsy.  Procedure: After informed consent was obtained she was taken to the operating room.  She was given antibiotics.  SCDs were placed.  She was placed under general anesthesia without complication.  She was prepped and draped in the standard sterile surgical fashion.  A surgical timeout was then performed.  I first injected 2 cc of mag trace in the subareolar position and massaged this for 5 minutes.  I first did the lumpectomy.  This was in the upper outer quadrant.  There was not too far of a distance from the seed to the skin so I ended up making a curvilinear incision overlying the tumor.  I then used the neoprobe to remove the seed in the surrounding tissue with an attempt to get a clear margin.  Mammogram confirmed removal of the  seed and the clip.  I then did a 3D image and thought I might be close to several margins which I removed as above.  Hemostasis was then obtained.  Clips were placed in the cavity.  I then closed this with 2-0 Vicryl for the breast tissue with 3-0 Vicryl and 4-0 Monocryl for the skin.  Glue and Steri-Strips were eventually applied.  I then made a curvilinear incision below the hairline in the axilla.  I carried this to the axillary fascia.  There were several brown lymph nodes that had activity in them.  I removed these.  There were no other brown or enlarged lymph nodes in the axilla at the completion of this.  I obtained hemostasis.  I then closed this with 2-0 Vicryl, 3-0 Vicryl, 4-0 Monocryl.  Glue and Steri-Strips applied.  She tolerated this well was extubated and transferred recovery stable.

## 2023-02-14 NOTE — Progress Notes (Signed)
Assisted Dr. Howze with left, pectoralis, ultrasound guided block. Side rails up, monitors on throughout procedure. See vital signs in flow sheet. Tolerated Procedure well. 

## 2023-02-14 NOTE — Transfer of Care (Signed)
Immediate Anesthesia Transfer of Care Note  Patient: Sue Beasley  Procedure(s) Performed: LEFT BREAST LUMPECTOMY WITH RADIOACTIVE SEED AND AXILLARY SENTINEL LYMPH NODE BIOPSY (Left: Breast)  Patient Location: PACU  Anesthesia Type:General  Level of Consciousness: awake, alert , and oriented  Airway & Oxygen Therapy: Patient Spontanous Breathing and Patient connected to nasal cannula oxygen  Post-op Assessment: Report given to RN and Post -op Vital signs reviewed and stable  Post vital signs: Reviewed and stable  Last Vitals:  Vitals Value Taken Time  BP 121/64 02/14/23 1016  Temp    Pulse 76 02/14/23 1017  Resp 19 02/14/23 1017  SpO2 99 % 02/14/23 1017  Vitals shown include unvalidated device data.  Last Pain:  Vitals:   02/14/23 0809  TempSrc: Oral  PainSc: 0-No pain      Patients Stated Pain Goal: 5 (02/14/23 0809)  Complications: No notable events documented.

## 2023-02-14 NOTE — Discharge Instructions (Addendum)
[facility] Office Phone Number [phone]  POST OP INSTRUCTIONS Take 400 mg of ibuprofen every 8 hours or 650 mg tylenol every 6 hours for next 72 hours then as needed. Use ice several times daily also.  A prescription for pain medication may be given to you upon discharge.  Take your pain medication as prescribed, if needed.  If narcotic pain medicine is not needed, then you may take acetaminophen (Tylenol), naprosyn (Alleve) or ibuprofen (Advil) as needed. Take your usually prescribed medications unless otherwise directed If you need a refill on your pain medication, please contact your pharmacy.  They will contact our office to request authorization.  Prescriptions will not be filled after 5pm or on week-ends. You should eat very light the first 24 hours after surgery, such as soup, crackers, pudding, etc.  Resume your normal diet the day after surgery. Most patients will experience some swelling and bruising in the breast.  Ice packs and a good support bra will help.  Wear the breast binder provided or a sports bra for 72 hours day and night.  After that wear a sports bra during the day until you return to the office. Swelling and bruising can take several days to resolve.  It is common to experience some constipation if taking pain medication after surgery.  Increasing fluid intake and taking a stool softener will usually help or prevent this problem from occurring.  A mild laxative (Milk of Magnesia or Miralax) should be taken according to package directions if there are no bowel movements after 48 hours. I used skin glue on the incision, you may shower in 24 hours.  The glue will flake off over the next 2-3 weeks.  Any sutures or staples will be removed at the office during your follow-up visit. ACTIVITIES:  You may resume regular daily activities (gradually increasing) beginning the next day.  Wearing a good support bra or sports bra minimizes pain and swelling.  You may have  sexual intercourse when it is comfortable. You may drive when you no longer are taking prescription pain medication, you can comfortably wear a seatbelt, and you can safely maneuver your car and apply brakes. RETURN TO WORK:  ______________________________________________________________________________________ You should see your doctor in the office for a follow-up appointment approximately two weeks after your surgery.  Your doctor's nurse will typically make your follow-up appointment when she calls you with your pathology report.  Expect your pathology report 3-4 business days after your surgery.  You may call to check if you do not hear from us after three days. OTHER INSTRUCTIONS: _______________________________________________________________________________________________ _____________________________________________________________________________________________________________________________________ _____________________________________________________________________________________________________________________________________ _____________________________________________________________________________________________________________________________________  WHEN TO CALL DR WAKEFIELD: Fever over 101.0 Nausea and/or vomiting. Extreme swelling or bruising. Continued bleeding from incision. Increased pain, redness, or drainage from the incision.  The clinic staff is available to answer your questions during regular business hours.  Please don't hesitate to call and ask to speak to one of the nurses for clinical concerns.  If you have a medical emergency, go to the nearest emergency room or call 911.  A surgeon from [facility] is always on call at the hospital.  For further questions, please visit [email or web address]   No Tylenol until after 2:15pm today if needed   Post Anesthesia Home Care Instructions  Activity: Get plenty of rest for the remainder  of the day. A responsible individual must stay with you for 24 hours following the procedure.  For the next 24 hours, DO NOT: -Drive a car -Operate   machinery -Drink alcoholic beverages -Take any medication unless instructed by your physician -Make any legal decisions or sign important papers.  Meals: Start with liquid foods such as gelatin or soup. Progress to regular foods as tolerated. Avoid greasy, spicy, heavy foods. If nausea and/or vomiting occur, drink only clear liquids until the nausea and/or vomiting subsides. Call your physician if vomiting continues.  Special Instructions/Symptoms: Your throat may feel dry or sore from the anesthesia or the breathing tube placed in your throat during surgery. If this causes discomfort, gargle with warm salt water. The discomfort should disappear within 24 hours.  If you had a scopolamine patch placed behind your ear for the management of post- operative nausea and/or vomiting:  1. The medication in the patch is effective for 72 hours, after which it should be removed.  Wrap patch in a tissue and discard in the trash. Wash hands thoroughly with soap and water. 2. You may remove the patch earlier than 72 hours if you experience unpleasant side effects which may include dry mouth, dizziness or visual disturbances. 3. Avoid touching the patch. Wash your hands with soap and water after contact with the patch.     

## 2023-02-14 NOTE — Anesthesia Procedure Notes (Signed)
Anesthesia Regional Block: Pectoralis block   Pre-Anesthetic Checklist: , timeout performed,  Correct Patient, Correct Site, Correct Laterality,  Correct Procedure, Correct Position, site marked,  Risks and benefits discussed,  Pre-op evaluation,  At surgeon's request and post-op pain management  Laterality: Left  Prep: Maximum Sterile Barrier Precautions used, chloraprep       Needles:  Injection technique: Single-shot  Needle Type: Echogenic Stimulator Needle     Needle Length: 9cm  Needle Gauge: 22     Additional Needles:   Procedures:,,,, ultrasound used (permanent image in chart),,    Narrative:  Start time: 02/14/2023 8:45 AM End time: 02/14/2023 8:48 AM Injection made incrementally with aspirations every 5 mL.  Performed by: Personally  Anesthesiologist: Kaylyn Layer, MD  Additional Notes: Risks, benefits, and alternative discussed. Patient gave consent for procedure. Patient prepped and draped in sterile fashion. Sedation administered, patient remains easily responsive to voice. Relevant anatomy identified with ultrasound guidance. Local anesthetic given in 5cc increments with no signs or symptoms of intravascular injection. No pain or paraesthesias with injection. Patient monitored throughout procedure with signs of LAST or immediate complications. Tolerated well. Ultrasound image placed in chart.  Amalia Greenhouse, MD

## 2023-02-15 ENCOUNTER — Encounter (HOSPITAL_BASED_OUTPATIENT_CLINIC_OR_DEPARTMENT_OTHER): Payer: Self-pay | Admitting: General Surgery

## 2023-02-16 LAB — SURGICAL PATHOLOGY

## 2023-02-24 ENCOUNTER — Encounter: Payer: Self-pay | Admitting: General Practice

## 2023-02-24 ENCOUNTER — Telehealth: Payer: Self-pay | Admitting: Genetic Counselor

## 2023-02-24 NOTE — Progress Notes (Signed)
[facility] Spiritual Care Note  Followed up with Sue Beasley by phone as planned. She reports that she is overall healing well, though the recovery exercises take some getting used to. She is currently in [place] family to celebrate her niece's graduation and is awaiting test results to learn whether chemotherapy will be indicated.  Provided reflective listening and emotional support.  We plan to follow up by phone in ca two weeks, when she should know more about her treatment plan.   [address] [place], [place], [facility] Pager [phone] Voicemail [phone]

## 2023-02-24 NOTE — Telephone Encounter (Signed)
Called to disclose neg results of pan-cancer panel.  LVM with contact information requesting a call back.

## 2023-02-25 ENCOUNTER — Ambulatory Visit: Payer: Self-pay | Admitting: Genetic Counselor

## 2023-02-25 ENCOUNTER — Telehealth: Payer: Self-pay | Admitting: Genetic Counselor

## 2023-02-25 DIAGNOSIS — Z8042 Family history of malignant neoplasm of prostate: Secondary | ICD-10-CM

## 2023-02-25 DIAGNOSIS — Z170 Estrogen receptor positive status [ER+]: Secondary | ICD-10-CM

## 2023-02-25 DIAGNOSIS — Z1379 Encounter for other screening for genetic and chromosomal anomalies: Secondary | ICD-10-CM

## 2023-02-25 DIAGNOSIS — Z800 Family history of malignant neoplasm of digestive organs: Secondary | ICD-10-CM

## 2023-02-25 NOTE — Telephone Encounter (Signed)
Disclosed negative pan-cancer panel.    

## 2023-02-26 NOTE — Progress Notes (Incomplete)
Patient Care Team: [name], MD as PCP - General Serena Croissant, MD as Consulting Physician (Hematology and Oncology) Antony Blackbird, MD as Consulting Physician (Radiation Oncology) Emelia Loron, MD as Consulting Physician (General Surgery) Pershing Proud, RN as Oncology Nurse Navigator Donnelly Angelica, RN as Oncology Nurse Navigator  DIAGNOSIS: No diagnosis found.  SUMMARY OF ONCOLOGIC HISTORY: Oncology History  Malignant neoplasm of upper-outer quadrant of left breast in female, estrogen receptor positive (HCC)  01/14/2023 Initial Diagnosis   Screening mammogram detected left breast mass UOQ 12:30 position: 1.1 cm, ultrasound biopsy revealed poorly differentiated IDC grade 3, ER 100%, PR 3%, Ki-67 70%, HER2 negative, axillary lymph node biopsy: Benign concordant   01/26/2023 Cancer Staging   Staging form: Breast, AJCC 8th Edition - Clinical: Stage IA (cT1c, cN0, cM0, G3, ER+, PR+, HER2-) - Signed by Serena Croissant, MD on 01/26/2023 Histologic grading system: 3 grade system   02/03/2023 Genetic Testing   Negative [facility] CustomNext-Cancer +RNAinsight Panel.  Report date is 02/10/2023.   The CustomNext-Cancer+RNAinsight panel offered by [facility] includes sequencing, rearrangement, and RNA analysis for the following 47 genes:  APC, ATM, AXIN2, BAP1, BARD1, BMPR1A, BRCA1, BRCA2, BRIP1, CDH1, CDK4, CDKN2A, CHEK2, CTNNA1, DICER1, EPCAM, FH, GREM1, HOXB13, KIT, MBD4, MEN1, MLH1, MSH2, MSH3, MSH6, MUTYH, NF1, NTHL1, PALB2, PDGFRA, PMS2, POLD1, POLE, PTEN, RAD51C, RAD51D, SDHA, SDHB, SDHC, SDHD, SMAD4, SMARCA4, STK11, TP53, TSC1, TSC2, VHL.        CHIEF COMPLIANT: Follow-up after surgery   INTERVAL HISTORY: Sue Beasley is a 52 y.o. female is here because of recent diagnosis of left breast cancer. She presents to the clinic for a follow-up.    ALLERGIES:  is allergic to soy allergy, amoxicillin-pot clavulanate, egg-derived products, peanut-containing drug products,  shellfish-derived products, and amoxicillin.  MEDICATIONS:  Current Outpatient Medications  Medication Sig Dispense Refill   albuterol (VENTOLIN HFA) 108 (90 Base) MCG/ACT inhaler Inhale 2 puffs into the lungs 4 (four) times daily.     azelastine (ASTELIN) 0.1 % nasal spray USE 1 SPRAY IN EACH NOSTRIL TWICE A DAY AS NEEDED  5   desoximetasone (TOPICORT) 0.25 % cream Apply topically 2 (two) times daily.     dorzolamide-timolol (COSOPT) 22.3-6.8 MG/ML ophthalmic solution SMARTSIG:In Eye(s)     EPINEPHrine 0.3 mg/0.3 mL IJ SOAJ injection      ferrous sulfate 325 (65 FE) MG tablet Take 325 mg by mouth daily.     fluticasone (FLONASE) 50 MCG/ACT nasal spray 2 SPRAY IN EACH NOSTRIL ONCE A DAY NASALLY 30 DAY(S)  4   fluticasone-salmeterol (WIXELA INHUB) 250-50 MCG/ACT AEPB Inhale 1 puff into the lungs daily.     latanoprost (XALATAN) 0.005 % ophthalmic solution SMARTSIG:In Eye(s)     loratadine (CLARITIN) 10 MG tablet Take 10 mg by mouth daily.     traMADol (ULTRAM) 50 MG tablet Take 1 tablet (50 mg total) by mouth every 6 (six) hours as needed. 10 tablet 0   No current facility-administered medications for this visit.    PHYSICAL EXAMINATION: ECOG PERFORMANCE STATUS: {CHL ONC ECOG PS:[phone]}  There were no vitals filed for this visit. There were no vitals filed for this visit.  BREAST:*** No palpable masses or nodules in either right or left breasts. No palpable axillary supraclavicular or infraclavicular adenopathy no breast tenderness or nipple discharge. (exam performed in the presence of a chaperone)  LABORATORY DATA:  I have reviewed the data as listed    Latest Ref Rng & Units 01/26/2023  12:38 PM 11/18/2022    3:00 PM 11/16/2021    3:57 PM  CMP  Glucose 70 - 99 mg/dL 098  119  70   BUN 6 - 20 mg/dL 8  8  10    Creatinine 0.44 - 1.00 mg/dL 1.47  8.29  5.62   Sodium 135 - 145 mmol/L 138  137  138   Potassium 3.5 - 5.1 mmol/L 3.5  3.6  3.6   Chloride 98 - 111 mmol/L 106  105   104   CO2 22 - 32 mmol/L 27  24  26    Calcium 8.9 - 10.3 mg/dL 8.9  8.7  8.6   Total Protein 6.5 - 8.1 g/dL 7.3  7.2  7.5   Total Bilirubin 0.3 - 1.2 mg/dL 0.4  0.2  0.3   Alkaline Phos 38 - 126 U/L 77  75  75   AST 15 - 41 U/L 17  15  16    ALT 0 - 44 U/L 5  12  12      Lab Results  Component Value Date   WBC 4.5 01/26/2023   HGB 12.6 01/26/2023   HCT 39.9 01/26/2023   MCV 71.0 (L) 01/26/2023   PLT 298 01/26/2023   NEUTROABS 2.0 01/26/2023    ASSESSMENT & PLAN:  No problem-specific Assessment & Plan notes found for this encounter.    No orders of the defined types were placed in this encounter.  The patient has a good understanding of the overall plan. she agrees with it. she will call with any problems that may develop before the next visit here. Total time spent: 30 mins including face to face time and time spent for planning, charting and co-ordination of care   Sherlyn Lick, CMA 02/26/23    I Janan Ridge am acting as a Neurosurgeon for [employer]  ***

## 2023-02-28 ENCOUNTER — Telehealth: Payer: Self-pay

## 2023-02-28 NOTE — Telephone Encounter (Signed)
Received fax from [facility] today requesting clarification on pt's diagnosis. Clarification faxed to [employer] with confirmation.   Pt called to find out if she would need to r/s MD appt. Per MD, he would still like to see her tomorrow to discuss path after surgery. He will call her when results come back from oncotype. She verbalized understanding.

## 2023-03-01 ENCOUNTER — Inpatient Hospital Stay: Payer: BC Managed Care – PPO | Attending: Hematology and Oncology | Admitting: Hematology and Oncology

## 2023-03-01 VITALS — BP 160/83 | HR 65 | Temp 97.3°F | Resp 18 | Ht 61.0 in | Wt 206.3 lb

## 2023-03-01 DIAGNOSIS — C50412 Malignant neoplasm of upper-outer quadrant of left female breast: Secondary | ICD-10-CM | POA: Diagnosis not present

## 2023-03-01 DIAGNOSIS — Z452 Encounter for adjustment and management of vascular access device: Secondary | ICD-10-CM | POA: Insufficient documentation

## 2023-03-01 DIAGNOSIS — Z170 Estrogen receptor positive status [ER+]: Secondary | ICD-10-CM | POA: Insufficient documentation

## 2023-03-01 DIAGNOSIS — Z5189 Encounter for other specified aftercare: Secondary | ICD-10-CM | POA: Insufficient documentation

## 2023-03-01 DIAGNOSIS — D701 Agranulocytosis secondary to cancer chemotherapy: Secondary | ICD-10-CM | POA: Diagnosis not present

## 2023-03-01 DIAGNOSIS — Z5111 Encounter for antineoplastic chemotherapy: Secondary | ICD-10-CM | POA: Diagnosis present

## 2023-03-01 NOTE — Assessment & Plan Note (Addendum)
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 I discussed the margins as well as lymph node surgeries. We also discussed the final staging along with previously performed ER/PR and HER-2/neu testing.  Treatment plan Oncotype DX testing to determine if chemotherapy would be of any benefit followed by Adjuvant radiation therapy followed by Adjuvant antiestrogen therapy  Return to clinic based upon Oncotype DX test result We discussed the nephrograms of Oncotype score.  If the score is less than 15 there is no role for chemo.  Between the score 15-25 she will be eligible to participate in the offset clinical trial.  A score above 25 she would benefit from chemotherapy.  We will see her back based upon her Oncotype score.

## 2023-03-07 ENCOUNTER — Ambulatory Visit: Payer: BC Managed Care – PPO | Attending: Hematology and Oncology | Admitting: Rehabilitation

## 2023-03-07 DIAGNOSIS — Z170 Estrogen receptor positive status [ER+]: Secondary | ICD-10-CM | POA: Insufficient documentation

## 2023-03-07 DIAGNOSIS — Z9189 Other specified personal risk factors, not elsewhere classified: Secondary | ICD-10-CM | POA: Insufficient documentation

## 2023-03-07 DIAGNOSIS — C50412 Malignant neoplasm of upper-outer quadrant of left female breast: Secondary | ICD-10-CM | POA: Insufficient documentation

## 2023-03-07 DIAGNOSIS — Z483 Aftercare following surgery for neoplasm: Secondary | ICD-10-CM | POA: Diagnosis present

## 2023-03-07 NOTE — Therapy (Unsigned)
OUTPATIENT PHYSICAL THERAPY BREAST CANCER POST OP FOLLOW UP   Patient Name: Sue Beasley MRN: [id number] DOB:[birth year], 52 y.o., female Today's Date: 03/08/2023  END OF SESSION:  PT End of Session - 03/08/23 1312     Visit Number 2    Number of Visits 2    Date for PT Re-Evaluation 03/22/23    PT Start Time 1200    PT Stop Time 1236    PT Time Calculation (min) 36 min    Activity Tolerance Patient tolerated treatment well    Behavior During Therapy [facility] for tasks assessed/performed             Past Medical History:  Diagnosis Date   ALLERGIC RHINITIS 11/06/2009   Allergy    Anemia    ANEMIA-IRON DEFICIENCY 11/20/2009   Asthma    Cancer (HCC) 12/2022   left breast IDC   Eczema    Heart murmur    Irreducible umbilical hernia 03/01/2011   Wears glasses    Past Surgical History:  Procedure Laterality Date   BREAST BIOPSY Left 01/14/2023   [place] LT BREAST BX W LOC DEV 1ST LESION IMG BX SPEC US GUIDE 01/14/2023 [facility]   BREAST BIOPSY  02/10/2023   MM LT RADIOACTIVE SEED LOC MAMMO GUIDE 02/10/2023 [facility]MO[facility]   BREAST LUMPECTOMY WITH RADIOACTIVE SEED AND SENTINEL LYMPH NODE BIOPSY Left 02/14/2023   Procedure: LEFT BREAST LUMPECTOMY WITH RADIOACTIVE SEED AND AXILLARY SENTINEL LYMPH NODE BIOPSY;  Surgeon: Emelia Loron, MD;  Location: [facility];  Service: General;  Laterality: Left;   UMBILICAL HERNIA REPAIR  05/13/11   Patient Active Problem List   Diagnosis Date Noted   Genetic testing 02/04/2023   Malignant neoplasm of upper-outer quadrant of left breast in female, estrogen receptor positive (HCC) 01/24/2023   Low vitamin D level 11/19/2021   Incontinence 12/19/2020   Low mean corpuscular volume (MCV) 11/23/2020   Pain and swelling of wrist, left 03/14/2020   Hyperglycemia 11/09/2019   Uterine leiomyoma 11/09/2019   Acute upper respiratory infection 02/12/2019   Increased body mass index 11/02/2018   Obesity 08/02/2014   Heart  murmur, systolic 08/01/2014   Encounter for well adult exam with abnormal findings 03/01/2011   Hyperlipidemia 11/20/2009   Iron deficiency anemia 11/20/2009   Allergic rhinitis 11/06/2009   Asthma 11/06/2009    PCP: Oliver Barre, MD   REFERRING PROVIDER: Dr. Pamelia Hoit   REFERRING DIAG: [phone] (ICD-10-CM) - Malignant neoplasm of upper-outer quadrant of left breast in female, estrogen receptor positive   THERAPY DIAG:  Malignant neoplasm of upper-outer quadrant of left breast in female, estrogen receptor positive (HCC)  Aftercare following surgery for neoplasm  At risk for lymphedema  Rationale for Evaluation and Treatment: Rehabilitation  ONSET DATE: 01/24/23  SUBJECTIVE:  SUBJECTIVE STATEMENT: I am doing okay.  I was waiting to get back to walking.    PERTINENT HISTORY:  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 It measures 1.1 cm and is located in the upper outer quadrant. It is ER/PR positive HER2 negative with a Ki67 of 70%. left lumpectomy and SLNB on 02/14/23 with oncotype testing (still pending) and radiation. 8 negative nodes removed. 1 aspiration in the axilla of 75ml  PATIENT GOALS:  Reassess how my recovery is going related to arm function, pain, and swelling.  PAIN:  Are you having pain? No  PRECAUTIONS: Recent Surgery, left UE Lymphedema risk  ACTIVITY LEVEL / LEISURE: I haven't gone back to walking    OBJECTIVE:   PATIENT SURVEYS:  QUICK DASH: 15% from 0%  OBSERVATIONS: Healing well   POSTURE:  WNL  LYMPHEDEMA ASSESSMENT:  UPPER EXTREMITY AROM/PROM:   A/PROM RIGHT   eval    Shoulder extension 70  Shoulder flexion 165  Shoulder abduction 165  Shoulder internal rotation    Shoulder external rotation 115                          (Blank rows = not  tested)   A/PROM LEFT   eval 03/07/23  Shoulder extension 70 70  Shoulder flexion 165 165  Shoulder abduction 165 165  Shoulder internal rotation     Shoulder external rotation 110 110                          (Blank rows = not tested)   CERVICAL AROM: All within normal limits:    UPPER EXTREMITY STRENGTH: WNL   LYMPHEDEMA ASSESSMENTS:    LANDMARK LEFT eval 5/13  10 cm proximal to olecranon process 35.3 35  Olecranon process 26.2 25.2  10 cm proximal to ulnar styloid process 20.6 20.5  Just proximal to ulnar styloid process 15.7 15.7  Across hand at thumb web space 18.5   At base of 2nd digit 6.3   (Blank rows = not tested)   LANDMARK RIGHT eval  10 cm proximal to olecranon process 34  Olecranon process 25  10 cm proximal to ulnar styloid process 21.5  Just proximal to ulnar styloid process 15.6  Across hand at thumb web space 18.7  At base of 2nd digit 6.2  (Blank rows = not tested)   PATIENT EDUCATION:  Education details: post op care Person educated: Patient Education method: Explanation, Demonstration, Actor cues, Verbal cues, and Handouts Education comprehension: verbalized understanding  HOME EXERCISE PROGRAM: Reviewed previously given post op HEP.   ASSESSMENT:  CLINICAL IMPRESSION: Pt is doing well with continue with SOZO only   Pt will benefit from skilled therapeutic intervention to improve on the following deficits: Decreased knowledge of precautions, impaired UE functional use, pain, decreased ROM, postural dysfunction.   PT treatment/interventions: ADL/Self care home management, Therapeutic exercises, Patient/Family education, Manual therapy, and Re-evaluation   GOALS: Goals reviewed with patient? Yes  LONG TERM GOALS:  (STG=LTG)  GOALS Name Target Date  Goal status  1 Pt will demonstrate she has regained full shoulder ROM and function post operatively compared to baselines.  Baseline: 03/07/23 MET                    PLAN:  PT  FREQUENCY/DURATION: SOZO only  PLAN FOR NEXT SESSION: SOZO only   [facility]  [address],  [address]  [place] [address]  [phone]  After Breast Cancer Class It is recommended you attend the ABC class to be educated on lymphedema risk reduction. This class is free of charge and lasts for 1 hour. It is a 1-time class. You will need to download the TEAMS app either on your phone or computer. We will send you a link the night before or the morning of the class. You should be able to click on that link to join the class. This is not a confidential class. You don't have to turn your camera on, but other participants may be able to see your email address.  Scar massage You can begin gentle scar massage to you incision sites. Gently place one hand on the incision and move the skin (without sliding on the skin) in various directions. Do this for a few minutes and then you can gently massage either coconut oil or vitamin E cream into the scars.  Compression garment You should continue wearing your compression bra until you feel like you no longer have swelling.  Home exercise Program Continue doing the exercises you were given until you feel like you can do them without feeling any tightness at the end.   Walking Program Studies show that 30 minutes of walking per day (fast enough to elevate your heart rate) can significantly reduce the risk of a cancer recurrence. If you can't walk due to other medical reasons, we encourage you to find another activity you could do (like a stationary bike or water exercise).  Posture After breast cancer surgery, people frequently sit with rounded shoulders posture because it puts their incisions on slack and feels better. If you sit like this and scar tissue forms in that position, you can become very tight and have pain sitting or standing with good posture. Try to be aware of your posture and sit and stand up tall to heal  properly.  Follow up PT: It is recommended you return every 3 months for the first 3 years following surgery to be assessed on the SOZO machine for an L-Dex score. This helps prevent clinically significant lymphedema in 95% of patients. These follow up screens are 10 minute appointments that you are not billed for.  Idamae Lusher, PT 03/08/2023, 1:12 PM

## 2023-03-08 ENCOUNTER — Telehealth: Payer: Self-pay | Admitting: *Deleted

## 2023-03-08 ENCOUNTER — Inpatient Hospital Stay (HOSPITAL_BASED_OUTPATIENT_CLINIC_OR_DEPARTMENT_OTHER): Payer: BC Managed Care – PPO | Admitting: Hematology and Oncology

## 2023-03-08 ENCOUNTER — Encounter: Payer: Self-pay | Admitting: *Deleted

## 2023-03-08 ENCOUNTER — Other Ambulatory Visit: Payer: Self-pay | Admitting: General Surgery

## 2023-03-08 ENCOUNTER — Encounter: Payer: Self-pay | Admitting: Rehabilitation

## 2023-03-08 ENCOUNTER — Encounter: Payer: Self-pay | Admitting: Hematology and Oncology

## 2023-03-08 VITALS — BP 174/65 | HR 79 | Temp 97.2°F | Resp 18 | Ht 61.0 in | Wt 205.4 lb

## 2023-03-08 DIAGNOSIS — Z170 Estrogen receptor positive status [ER+]: Secondary | ICD-10-CM | POA: Diagnosis not present

## 2023-03-08 DIAGNOSIS — Z5111 Encounter for antineoplastic chemotherapy: Secondary | ICD-10-CM | POA: Diagnosis not present

## 2023-03-08 DIAGNOSIS — C50412 Malignant neoplasm of upper-outer quadrant of left female breast: Secondary | ICD-10-CM

## 2023-03-08 MED ORDER — DEXAMETHASONE 4 MG PO TABS
4.0000 mg | ORAL_TABLET | Freq: Every day | ORAL | 0 refills | Status: AC
Start: 2023-03-08 — End: ?

## 2023-03-08 MED ORDER — PROCHLORPERAZINE MALEATE 10 MG PO TABS
10.0000 mg | ORAL_TABLET | Freq: Four times a day (QID) | ORAL | 1 refills | Status: AC | PRN
Start: 2023-03-08 — End: ?

## 2023-03-08 MED ORDER — ONDANSETRON HCL 8 MG PO TABS
8.0000 mg | ORAL_TABLET | Freq: Three times a day (TID) | ORAL | 1 refills | Status: AC | PRN
Start: 2023-03-08 — End: ?

## 2023-03-08 MED ORDER — LIDOCAINE-PRILOCAINE 2.5-2.5 % EX CREA
TOPICAL_CREAM | CUTANEOUS | 3 refills | Status: AC
Start: 2023-03-08 — End: ?

## 2023-03-08 NOTE — Progress Notes (Signed)
START ON PATHWAY REGIMEN - Breast     A cycle is every 21 days:     Cyclophosphamide      Docetaxel   **Always confirm dose/schedule in your pharmacy ordering system**  Patient Characteristics: Postoperative without Neoadjuvant Therapy, M0 (Pathologic Staging), Invasive Disease, Adjuvant Therapy, HER2 Negative, ER Positive, Node Negative, pT1a, pN71mi or pT1b-c, pN0/N82mi or pT2 or Higher, pN0, Oncotype High Risk (? 26) Therapeutic Status: Postoperative without Neoadjuvant Therapy, M0 (Pathologic Staging) AJCC Grade: G3 AJCC N Category: pN0 AJCC M Category: cM0 ER Status: Positive (+) AJCC 8 Stage Grouping: IA HER2 Status: Negative (-) Oncotype Dx Recurrence Score: 36 AJCC T Category: pT1c PR Status: Positive (+) Has this patient completed genomic testing<= Yes - Oncotype DX(R) Intent of Therapy: Curative Intent, Discussed with Patient

## 2023-03-08 NOTE — Assessment & Plan Note (Signed)
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 Oncotype DX recurrence score: 36 (distant recurrence at 9 years: 24%)  Recommendation: Adjuvant chemotherapy with Taxotere and Cytoxan every 3 weeks x 4 Adjuvant radiation therapy Adjuvant antiestrogen therapy  Chemotherapy Counseling: I discussed the risks and benefits of chemotherapy including the risks of nausea/ vomiting, risk of infection from low WBC count, fatigue due to chemo or anemia, bruising or bleeding due to low platelets, mouth sores, loss/ change in taste and decreased appetite. Liver and kidney function will be monitored through out chemotherapy as abnormalities in liver and kidney function may be a side effect of treatment.  Neuropathy risk from Taxotere was discussed in detail. Risk of permanent bone marrow dysfunction and leukemia due to chemo were also discussed.  Return to clinic in 2 weeks to start chemotherapy

## 2023-03-08 NOTE — Progress Notes (Signed)
Patient Care Team: [name], MD as PCP - General Serena Croissant, MD as Consulting Physician (Hematology and Oncology) Antony Blackbird, MD as Consulting Physician (Radiation Oncology) Emelia Loron, MD as Consulting Physician (General Surgery) Pershing Proud, RN as Oncology Nurse Navigator Donnelly Angelica, RN as Oncology Nurse Navigator  DIAGNOSIS:  Encounter Diagnosis  Name Primary?   Malignant neoplasm of upper-outer quadrant of left breast in female, estrogen receptor positive (HCC) Yes    SUMMARY OF ONCOLOGIC HISTORY: Oncology History  Malignant neoplasm of upper-outer quadrant of left breast in female, estrogen receptor positive (HCC)  01/14/2023 Initial Diagnosis   Screening mammogram detected left breast mass UOQ 12:30 position: 1.1 cm, ultrasound biopsy revealed poorly differentiated IDC grade 3, ER 100%, PR 3%, Ki-67 70%, HER2 negative, axillary lymph node biopsy: Benign concordant   01/26/2023 Cancer Staging   Staging form: Breast, AJCC 8th Edition - Clinical: Stage IA (cT1c, cN0, cM0, G3, ER+, PR+, HER2-) - Signed by Serena Croissant, MD on 01/26/2023 Histologic grading system: 3 grade system   02/03/2023 Genetic Testing   Negative [facility] CustomNext-Cancer +RNAinsight Panel.  Report date is 02/10/2023.   The CustomNext-Cancer+RNAinsight panel offered by [facility] includes sequencing, rearrangement, and RNA analysis for the following 47 genes:  APC, ATM, AXIN2, BAP1, BARD1, BMPR1A, BRCA1, BRCA2, BRIP1, CDH1, CDK4, CDKN2A, CHEK2, CTNNA1, DICER1, EPCAM, FH, GREM1, HOXB13, KIT, MBD4, MEN1, MLH1, MSH2, MSH3, MSH6, MUTYH, NF1, NTHL1, PALB2, PDGFRA, PMS2, POLD1, POLE, PTEN, RAD51C, RAD51D, SDHA, SDHB, SDHC, SDHD, SMAD4, SMARCA4, STK11, TP53, TSC1, TSC2, VHL.      03/01/2023 Cancer Staging   Staging form: Breast, AJCC 8th Edition - Pathologic: Stage IA (pT1c, pN0, cM0, G3, ER+, PR+, HER2-) - Signed by Serena Croissant, MD on 03/01/2023 Histologic grading system: 3 grade system    03/03/2023 Oncotype testing   Oncotype DX recurrence score 36: Distant recurrence at 9 years: 24%   03/22/2023 -  Chemotherapy   Patient is on Treatment Plan : BREAST TC q21d       CHIEF COMPLIANT: oncotype results  INTERVAL HISTORY: Sue Beasley is a 52 y.o. female is here because of recent diagnosis of left breast cancer. She presents to the clinic for a follow-up. She reports that she is doing good from surgery. She denies any pain or discomfort in breast.   ALLERGIES:  is allergic to soy allergy, amoxicillin-pot clavulanate, egg-derived products, peanut-containing drug products, shellfish-derived products, and amoxicillin.  MEDICATIONS:  Current Outpatient Medications  Medication Sig Dispense Refill   albuterol (VENTOLIN HFA) 108 (90 Base) MCG/ACT inhaler Inhale 2 puffs into the lungs 4 (four) times daily.     azelastine (ASTELIN) 0.1 % nasal spray USE 1 SPRAY IN EACH NOSTRIL TWICE A DAY AS NEEDED  5   desoximetasone (TOPICORT) 0.25 % cream Apply topically 2 (two) times daily.     dorzolamide-timolol (COSOPT) 22.3-6.8 MG/ML ophthalmic solution SMARTSIG:In Eye(s)     EPINEPHrine 0.3 mg/0.3 mL IJ SOAJ injection      ferrous sulfate 325 (65 FE) MG tablet Take 325 mg by mouth daily.     fluticasone (FLONASE) 50 MCG/ACT nasal spray 2 SPRAY IN EACH NOSTRIL ONCE A DAY NASALLY 30 DAY(S)  4   fluticasone-salmeterol (WIXELA INHUB) 250-50 MCG/ACT AEPB Inhale 1 puff into the lungs daily.     latanoprost (XALATAN) 0.005 % ophthalmic solution SMARTSIG:In Eye(s)     loratadine (CLARITIN) 10 MG tablet Take 10 mg by mouth daily.     traMADol (ULTRAM) 50 MG tablet  Take 1 tablet (50 mg total) by mouth every 6 (six) hours as needed. 10 tablet 0   dexamethasone (DECADRON) 4 MG tablet Take 1 tablet (4 mg total) by mouth daily. Take 1 tab day before chemo and 1 tab day after 8 tablet 0   lidocaine-prilocaine (EMLA) cream Apply to affected area once 30 g 3   ondansetron (ZOFRAN) 8 MG tablet Take 1  tablet (8 mg total) by mouth every 8 (eight) hours as needed for nausea or vomiting. Start on the third day after chemotherapy. 30 tablet 1   prochlorperazine (COMPAZINE) 10 MG tablet Take 1 tablet (10 mg total) by mouth every 6 (six) hours as needed for nausea or vomiting. 30 tablet 1   No current facility-administered medications for this visit.    PHYSICAL EXAMINATION: ECOG PERFORMANCE STATUS: 1 - Symptomatic but completely ambulatory  Vitals:   03/08/23 1449  BP: (!) 174/65  Pulse: 79  Resp: 18  Temp: (!) 97.2 F (36.2 C)  SpO2: 100%   Filed Weights   03/08/23 1449  Weight: 205 lb 6.4 oz (93.2 kg)      LABORATORY DATA:  I have reviewed the data as listed    Latest Ref Rng & Units 01/26/2023   12:38 PM 11/18/2022    3:00 PM 11/16/2021    3:57 PM  CMP  Glucose 70 - 99 mg/dL 387  564  70   BUN 6 - 20 mg/dL 8  8  10    Creatinine 0.44 - 1.00 mg/dL 3.32  9.51  8.84   Sodium 135 - 145 mmol/L 138  137  138   Potassium 3.5 - 5.1 mmol/L 3.5  3.6  3.6   Chloride 98 - 111 mmol/L 106  105  104   CO2 22 - 32 mmol/L 27  24  26    Calcium 8.9 - 10.3 mg/dL 8.9  8.7  8.6   Total Protein 6.5 - 8.1 g/dL 7.3  7.2  7.5   Total Bilirubin 0.3 - 1.2 mg/dL 0.4  0.2  0.3   Alkaline Phos 38 - 126 U/L 77  75  75   AST 15 - 41 U/L 17  15  16    ALT 0 - 44 U/L 5  12  12      Lab Results  Component Value Date   WBC 4.5 01/26/2023   HGB 12.6 01/26/2023   HCT 39.9 01/26/2023   MCV 71.0 (L) 01/26/2023   PLT 298 01/26/2023   NEUTROABS 2.0 01/26/2023    ASSESSMENT & PLAN:  Malignant neoplasm of upper-outer quadrant of left breast in female, estrogen receptor positive (HCC) 02/14/2023: Left lumpectomy: 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 Oncotype DX recurrence score: 36 (distant recurrence at 9 years: 24%)  Recommendation: Adjuvant chemotherapy with Taxotere and Cytoxan every 3 weeks x 4 Adjuvant radiation  therapy Adjuvant antiestrogen therapy  Chemotherapy Counseling: I discussed the risks and benefits of chemotherapy including the risks of nausea/ vomiting, risk of infection from low WBC count, fatigue due to chemo or anemia, bruising or bleeding due to low platelets, mouth sores, loss/ change in taste and decreased appetite. Liver and kidney function will be monitored through out chemotherapy as abnormalities in liver and kidney function may be a side effect of treatment.  Neuropathy risk from Taxotere was discussed in detail. Risk of permanent bone marrow dysfunction and leukemia due to chemo were also discussed.  Return to clinic in 2 weeks to start chemotherapy   Orders Placed This Encounter  Procedures   Consent Attestation for Oncology Treatment    Order Specific Question:   The patient is informed of risks, benefits, side-effects of the prescribed oncology treatment. Potential short term and long term side effects and response rates discussed. After a long discussion, the patient made informed decision to proceed.    Answer:   Yes   Pregnancy, urine    Standing Status:   Standing    Number of Occurrences:   4    Standing Expiration Date:   03/07/2024   CBC with Differential (Cancer Center Only)    Standing Status:   Future    Standing Expiration Date:   03/21/2024   CMP (Cancer Center only)    Standing Status:   Future    Standing Expiration Date:   03/21/2024   CBC with Differential (Cancer Center Only)    Standing Status:   Future    Standing Expiration Date:   04/11/2024   CMP (Cancer Center only)    Standing Status:   Future    Standing Expiration Date:   04/11/2024   CBC with Differential (Cancer Center Only)    Standing Status:   Future    Standing Expiration Date:   05/02/2024   CMP (Cancer Center only)    Standing Status:   Future    Standing Expiration Date:   05/02/2024   CBC with Differential (Cancer Center Only)    Standing Status:   Future    Standing Expiration Date:    05/23/2024   CMP (Cancer Center only)    Standing Status:   Future    Standing Expiration Date:   05/23/2024   The patient has a good understanding of the overall plan. she agrees with it. she will call with any problems that may develop before the next visit here. Total time spent: 30 mins including face to face time and time spent for planning, charting and co-ordination of care   Tamsen Meek, MD 03/08/23    I Janan Ridge am acting as a Neurosurgeon for [employer]  I have reviewed the above documentation for accuracy and completeness, and I agree with the above.

## 2023-03-08 NOTE — Progress Notes (Signed)
HPI:   Sue Beasley was previously seen in the [facility] clinic due to a personal history of breast cancer and concerns regarding a hereditary predisposition to cancer.    Sue Beasley recent genetic test results were disclosed to her by telephone. These results and recommendations are discussed in more detail below.  CANCER HISTORY:  Oncology History  Malignant neoplasm of upper-outer quadrant of left breast in female, estrogen receptor positive (HCC)  01/14/2023 Initial Diagnosis   Screening mammogram detected left breast mass UOQ 12:30 position: 1.1 cm, ultrasound biopsy revealed poorly differentiated IDC grade 3, ER 100%, PR 3%, Ki-67 70%, HER2 negative, axillary lymph node biopsy: Benign concordant   01/26/2023 Cancer Staging   Staging form: Breast, AJCC 8th Edition - Clinical: Stage IA (cT1c, cN0, cM0, G3, ER+, PR+, HER2-) - Signed by Serena Croissant, MD on 01/26/2023 Histologic grading system: 3 grade system   02/03/2023 Genetic Testing   Negative [facility] CustomNext-Cancer +RNAinsight Panel.  Report date is 02/10/2023.   The CustomNext-Cancer+RNAinsight panel offered by [facility] includes sequencing, rearrangement, and RNA analysis for the following 47 genes:  APC, ATM, AXIN2, BAP1, BARD1, BMPR1A, BRCA1, BRCA2, BRIP1, CDH1, CDK4, CDKN2A, CHEK2, CTNNA1, DICER1, EPCAM, FH, GREM1, HOXB13, KIT, MBD4, MEN1, MLH1, MSH2, MSH3, MSH6, MUTYH, NF1, NTHL1, PALB2, PDGFRA, PMS2, POLD1, POLE, PTEN, RAD51C, RAD51D, SDHA, SDHB, SDHC, SDHD, SMAD4, SMARCA4, STK11, TP53, TSC1, TSC2, VHL.      03/01/2023 Cancer Staging   Staging form: Breast, AJCC 8th Edition - Pathologic: Stage IA (pT1c, pN0, cM0, G3, ER+, PR+, HER2-) - Signed by Serena Croissant, MD on 03/01/2023 Histologic grading system: 3 grade system   03/03/2023 Oncotype testing   Oncotype DX recurrence score 36: Distant recurrence at 9 years: 24%   03/22/2023 -  Chemotherapy   Patient is on Treatment Plan : BREAST TC q21d       FAMILY  HISTORY:  We obtained a detailed, 4-generation family history.  Significant diagnoses are listed below:      Family History  Problem Relation Age of Onset   Colon polyps Sister     Esophageal cancer Maternal Uncle 88   Pancreatic cancer Cousin 61        mat female cousin   Prostate cancer Cousin 73        mat cousin       Sue Beasley is unaware of previous family history of genetic testing for hereditary cancer risks.  There is no reported Ashkenazi Jewish ancestry. There is no known consanguinity.  GENETIC TEST RESULTS:  The [facility] CustomNext-Cancer +RNAinsight Panel found no pathogenic mutations.   The CustomNext-Cancer+RNAinsight panel offered by [facility] includes sequencing, rearrangement, and RNA analysis for the following 47 genes:  APC, ATM, AXIN2, BAP1, BARD1, BMPR1A, BRCA1, BRCA2, BRIP1, CDH1, CDK4, CDKN2A, CHEK2, CTNNA1, DICER1, EPCAM, FH, GREM1, HOXB13, KIT, MBD4, MEN1, MLH1, MSH2, MSH3, MSH6, MUTYH, NF1, NTHL1, PALB2, PDGFRA, PMS2, POLD1, POLE, PTEN, RAD51C, RAD51D, SDHA, SDHB, SDHC, SDHD, SMAD4, SMARCA4, STK11, TP53, TSC1, TSC2, VHL.   Marland Kitchen   The test report has been scanned into EPIC and is located under the Molecular Pathology section of the Results Review tab.  A portion of the result report is included below for reference. Genetic testing reported out on February 10, 2023.      Even though a pathogenic variant was not identified, possible explanations for the cancer in the family may include: There may be no hereditary risk for cancer in the family. The cancers in Sue Beasley and/or  her family may be sporadic/familial or due to other genetic and environmental factors. There may be a gene mutation in one of these genes that current testing methods cannot detect but that chance is small. There could be another gene that has not yet been discovered, or that we have not yet tested, that is responsible for the cancer diagnoses in the family.  It is also possible there is a  hereditary cause for the cancer in the family that Sue Beasley did not inherit.   Therefore, it is important to remain in touch with cancer genetics in the future so that we can continue to offer Sue Beasley the most up to date genetic testing.     ADDITIONAL GENETIC TESTING:   Sue Beasley genetic testing was fairly extensive.  If there are additional relevant genes identified to increase cancer risk that can be analyzed in the future, we would be happy to discuss and coordinate this testing at that time.     CANCER SCREENING RECOMMENDATIONS:  Sue Beasley test result is considered negative (normal).  This means that we have not identified a hereditary cause for her personal history of breast cancer at this time.   An individual's cancer risk and medical management are not determined by genetic test results alone. Overall cancer risk assessment incorporates additional factors, including personal medical history, family history, and any available genetic information that may result in a personalized plan for cancer prevention and surveillance. Therefore, it is recommended she continue to follow the cancer management and screening guidelines provided by her oncology and primary healthcare provider.  RECOMMENDATIONS FOR FAMILY MEMBERS:   Individuals in this family might be at some increased risk of developing cancer, over the general population risk, due to the family history of cancer.  Individuals in the family should notify their providers of the family history of cancer. We recommend women in this family have a yearly mammogram beginning at age 71, or 15 years younger than the earliest onset of cancer, an annual clinical breast exam, and perform monthly breast self-exams.  Risk models that take into account family history and hormonal history may be helpful in determining appropriate breast cancer screening options for family members. Other members of the family may still carry a pathogenic variant in  one of these genes that Sue Beasley did not inherit. Based on the family history of pancreatic cancer in her maternal cousin, we recommend relatives more closely related to this cousin have genetic counseling and testing. Sue Beasley can let us know if we can be of any assistance in coordinating genetic counseling and/or testing for these family members.      FOLLOW-UP:  Cancer genetics is a rapidly advancing field and it is possible that new genetic tests will be appropriate for her and/or her family members in the future. We encourage Sue Beasley to remain in contact with cancer genetics, so we can update her personal and family histories and let her know of advances in cancer genetics that may benefit this family.   Our contact number was provided.. She knows she is welcome to call us at anytime with additional questions or concerns.   Sharina Petre M. Rennie Plowman, MS, [facility] Genetic Counselor [email or web address] (P) [phone]

## 2023-03-08 NOTE — Telephone Encounter (Signed)
Received oncotype score of 36. Physician team notified. Pt scheduled to see Dr. Pamelia Hoit on 5/14 at 3pm to discuss results and treatment recommendations.

## 2023-03-08 NOTE — Telephone Encounter (Signed)
Error

## 2023-03-09 ENCOUNTER — Encounter: Payer: Self-pay | Admitting: Hematology and Oncology

## 2023-03-09 ENCOUNTER — Other Ambulatory Visit: Payer: Self-pay

## 2023-03-10 ENCOUNTER — Encounter (HOSPITAL_COMMUNITY): Payer: Self-pay

## 2023-03-10 ENCOUNTER — Inpatient Hospital Stay: Payer: BC Managed Care – PPO

## 2023-03-10 ENCOUNTER — Other Ambulatory Visit: Payer: Self-pay

## 2023-03-11 ENCOUNTER — Telehealth: Payer: Self-pay | Admitting: Hematology and Oncology

## 2023-03-11 ENCOUNTER — Other Ambulatory Visit: Payer: Self-pay | Admitting: Hematology and Oncology

## 2023-03-11 ENCOUNTER — Encounter: Payer: Self-pay | Admitting: General Practice

## 2023-03-11 NOTE — Progress Notes (Signed)
[facility] Spiritual Care Note  Attempted follow-up by phone, leaving voicemail with direct number and encouragement to return call. Plan to follow up in treatment as well.   [address] [place], [place], [facility] Pager [phone] Voicemail [phone]

## 2023-03-11 NOTE — Telephone Encounter (Signed)
Scheduled appointment per WQ. Left voicemail. 

## 2023-03-14 ENCOUNTER — Encounter (HOSPITAL_COMMUNITY): Payer: Self-pay | Admitting: General Surgery

## 2023-03-14 ENCOUNTER — Other Ambulatory Visit: Payer: Self-pay

## 2023-03-14 NOTE — Pre-Procedure Instructions (Signed)
PCP - [name], MD Oncologist -  Serena Croissant, MD Allergist - Stefano Gaul, MD ECHO - 08/19/2013  Anesthesia review: N  Patient verbally denies any shortness of breath, fever, cough and chest pain during phone call   -------------  SDW INSTRUCTIONS given:  Your procedure is scheduled on Tuesday, May 21st.  Report to [facility] Main Entrance "A" at 1215 pm, and check in at the Admitting office.  Call this number if you have problems the morning of surgery:  [phone]   Remember:  Do not eat after midnight the night before your surgery  You may drink clear liquids until 1145 the morning of your surgery.   Clear liquids allowed are: Water, Non-Citrus Juices (without pulp), Carbonated Beverages, Clear Tea, Black Coffee Only, and Gatorade    Take these medicines the morning of surgery with A SIP OF WATER  azelastine (ASTELIN)  COSOPT  fluticasone (FLONASE)  WIXELA INHUB CLARITIN  albuterol (VENTOLIN HFA)-if needed (Please bring on the day of surgery) ZADITOR-if needed  As of today, STOP taking any Aspirin (unless otherwise instructed by your surgeon) Aleve, Naproxen, Ibuprofen, Motrin, Advil, Goody's, BC's, all herbal medications, fish oil, and all vitamins.                      Do not wear jewelry, make up, or nail polish            Do not wear lotions, powders, perfumes/colognes, or deodorant.            Do not shave 48 hours prior to surgery.  Men may shave face and neck.            Do not bring valuables to the hospital.            [facility] is not responsible for any belongings or valuables.  Do NOT Smoke (Tobacco/Vaping) 24 hours prior to your procedure If you use a CPAP at night, you may bring all equipment for your overnight stay.   Contacts, glasses, dentures or bridgework may not be worn into surgery.      For patients admitted to the hospital, discharge time will be determined by your treatment team.   Patients discharged the day of surgery will not  be allowed to drive home, and someone needs to stay with them for 24 hours.    Special instructions:   Section- Preparing For Surgery  Before surgery, you can play an important role. Because skin is not sterile, your skin needs to be as free of germs as possible. You can reduce the number of germs on your skin by washing with CHG (chlorahexidine gluconate) Soap before surgery.  CHG is an antiseptic cleaner which kills germs and bonds with the skin to continue killing germs even after washing.    Oral Hygiene is also important to reduce your risk of infection.  Remember - BRUSH YOUR TEETH THE MORNING OF SURGERY WITH YOUR REGULAR TOOTHPASTE  Please do not use if you have an allergy to CHG or antibacterial soaps. If your skin becomes reddened/irritated stop using the CHG.  Do not shave (including legs and underarms) for at least 48 hours prior to first CHG shower. It is OK to shave your face.  Please follow these instructions carefully.   Shower the NIGHT BEFORE SURGERY and the MORNING OF SURGERY with DIAL Soap.   Pat yourself dry with a CLEAN TOWEL.  Wear CLEAN PAJAMAS to bed the night before surgery  Place CLEAN SHEETS on your bed the night of your first shower and DO NOT SLEEP WITH PETS.   Day of Surgery: Please shower morning of surgery  Wear Clean/Comfortable clothing the morning of surgery Do not apply any deodorants/lotions.   Remember to brush your teeth WITH YOUR REGULAR TOOTHPASTE.   Questions were answered. Patient verbalized understanding of instructions.

## 2023-03-15 ENCOUNTER — Ambulatory Visit (HOSPITAL_COMMUNITY): Payer: BC Managed Care – PPO | Admitting: Certified Registered Nurse Anesthetist

## 2023-03-15 ENCOUNTER — Other Ambulatory Visit: Payer: Self-pay

## 2023-03-15 ENCOUNTER — Ambulatory Visit (HOSPITAL_COMMUNITY): Payer: BC Managed Care – PPO

## 2023-03-15 ENCOUNTER — Ambulatory Visit (HOSPITAL_COMMUNITY)
Admission: RE | Admit: 2023-03-15 | Discharge: 2023-03-15 | Disposition: A | Discharge status: Home / Self Care | Payer: BC Managed Care – PPO | Attending: General Surgery | Admitting: General Surgery

## 2023-03-15 ENCOUNTER — Encounter: Payer: Self-pay | Admitting: *Deleted

## 2023-03-15 ENCOUNTER — Encounter (HOSPITAL_COMMUNITY)
Admission: RE | Disposition: A | Discharge status: Home / Self Care | Payer: Self-pay | Source: Home / Self Care | Attending: General Surgery

## 2023-03-15 ENCOUNTER — Encounter (HOSPITAL_COMMUNITY): Payer: Self-pay | Admitting: General Surgery

## 2023-03-15 DIAGNOSIS — E785 Hyperlipidemia, unspecified: Secondary | ICD-10-CM | POA: Diagnosis not present

## 2023-03-15 DIAGNOSIS — J45909 Unspecified asthma, uncomplicated: Secondary | ICD-10-CM | POA: Diagnosis not present

## 2023-03-15 DIAGNOSIS — Z170 Estrogen receptor positive status [ER+]: Secondary | ICD-10-CM | POA: Insufficient documentation

## 2023-03-15 DIAGNOSIS — Z7951 Long term (current) use of inhaled steroids: Secondary | ICD-10-CM | POA: Diagnosis not present

## 2023-03-15 DIAGNOSIS — Z452 Encounter for adjustment and management of vascular access device: Secondary | ICD-10-CM | POA: Diagnosis present

## 2023-03-15 DIAGNOSIS — C50412 Malignant neoplasm of upper-outer quadrant of left female breast: Secondary | ICD-10-CM | POA: Diagnosis not present

## 2023-03-15 HISTORY — PX: PORTACATH PLACEMENT: SHX2246

## 2023-03-15 LAB — CBC
HCT: 39.5 % (ref 36.0–46.0)
Hemoglobin: 12.1 g/dL (ref 12.0–15.0)
MCH: 22 pg — ABNORMAL LOW (ref 26.0–34.0)
MCHC: 30.6 g/dL (ref 30.0–36.0)
MCV: 71.7 fL — ABNORMAL LOW (ref 80.0–100.0)
Platelets: 267 10*3/uL (ref 150–400)
RBC: 5.51 MIL/uL — ABNORMAL HIGH (ref 3.87–5.11)
RDW: 15.9 % — ABNORMAL HIGH (ref 11.5–15.5)
WBC: 4.7 10*3/uL (ref 4.0–10.5)
nRBC: 0 % (ref 0.0–0.2)

## 2023-03-15 LAB — BASIC METABOLIC PANEL
Anion gap: 11 (ref 5–15)
BUN: 9 mg/dL (ref 6–20)
CO2: 21 mmol/L — ABNORMAL LOW (ref 22–32)
Calcium: 8.6 mg/dL — ABNORMAL LOW (ref 8.9–10.3)
Chloride: 105 mmol/L (ref 98–111)
Creatinine, Ser: 0.75 mg/dL (ref 0.44–1.00)
GFR, Estimated: >60 mL/min (ref >60)
Glucose, Bld: 84 mg/dL (ref 70–99)
Potassium: 3.4 mmol/L — ABNORMAL LOW (ref 3.5–5.1)
Sodium: 137 mmol/L (ref 135–145)

## 2023-03-15 LAB — POCT PREGNANCY, URINE: Preg Test, Ur: NEGATIVE

## 2023-03-15 SURGERY — INSERTION, TUNNELED CENTRAL VENOUS DEVICE, WITH PORT
Anesthesia: General | Site: Neck | Laterality: Right

## 2023-03-15 MED ORDER — KETOROLAC TROMETHAMINE 30 MG/ML IJ SOLN
INTRAMUSCULAR | Status: AC
  Filled 2023-03-15: qty 1

## 2023-03-15 MED ORDER — HEPARIN 6000 UNIT IRRIGATION SOLUTION
Status: DC | PRN
  Administered 2023-03-15: 1

## 2023-03-15 MED ORDER — LACTATED RINGERS IV SOLN: INTRAVENOUS | Status: DC

## 2023-03-15 MED ORDER — 0.9 % SODIUM CHLORIDE (POUR BTL) OPTIME
TOPICAL | Status: DC | PRN
  Administered 2023-03-15: 1000 mL

## 2023-03-15 MED ORDER — MIDAZOLAM HCL 2 MG/2ML IJ SOLN
INTRAMUSCULAR | Status: DC | PRN
  Administered 2023-03-15: 2 mg via INTRAVENOUS

## 2023-03-15 MED ORDER — ORAL CARE MOUTH RINSE: 15.0000 mL | Freq: Once | OROMUCOSAL | Status: AC

## 2023-03-15 MED ORDER — BUPIVACAINE HCL 0.25 % IJ SOLN
INTRAMUSCULAR | Status: DC | PRN
  Administered 2023-03-15: 7 mL

## 2023-03-15 MED ORDER — OXYCODONE HCL 5 MG/5ML PO SOLN
ORAL | Status: AC
  Filled 2023-03-15: qty 5

## 2023-03-15 MED ORDER — ENSURE PRE-SURGERY PO LIQD: 296.0000 mL | Freq: Once | ORAL | Status: DC

## 2023-03-15 MED ORDER — ONDANSETRON HCL 4 MG/2ML IJ SOLN
INTRAMUSCULAR | Status: AC
  Filled 2023-03-15: qty 2

## 2023-03-15 MED ORDER — CEFAZOLIN SODIUM-DEXTROSE 2-4 GM/100ML-% IV SOLN
2.0000 g | INTRAVENOUS | Status: AC
  Administered 2023-03-15: 2 g via INTRAVENOUS
  Filled 2023-03-15: qty 100

## 2023-03-15 MED ORDER — HYDROMORPHONE HCL 1 MG/ML IJ SOLN: 0.2500 mg | INTRAMUSCULAR | Status: DC | PRN

## 2023-03-15 MED ORDER — FENTANYL CITRATE (PF) 250 MCG/5ML IJ SOLN
INTRAMUSCULAR | Status: AC
  Filled 2023-03-15: qty 5

## 2023-03-15 MED ORDER — PHENYLEPHRINE 80 MCG/ML (10ML) SYRINGE FOR IV PUSH (FOR BLOOD PRESSURE SUPPORT)
PREFILLED_SYRINGE | INTRAVENOUS | Status: AC
  Filled 2023-03-15: qty 10

## 2023-03-15 MED ORDER — BUPIVACAINE HCL (PF) 0.25 % IJ SOLN
INTRAMUSCULAR | Status: AC
  Filled 2023-03-15: qty 30

## 2023-03-15 MED ORDER — OXYCODONE HCL 5 MG/5ML PO SOLN
5.0000 mg | Freq: Once | ORAL | Status: AC | PRN
  Administered 2023-03-15: 5 mg via ORAL

## 2023-03-15 MED ORDER — FENTANYL CITRATE (PF) 250 MCG/5ML IJ SOLN
INTRAMUSCULAR | Status: DC | PRN
  Administered 2023-03-15 (×3): 50 ug via INTRAVENOUS

## 2023-03-15 MED ORDER — PROPOFOL 10 MG/ML IV BOLUS
INTRAVENOUS | Status: AC
  Filled 2023-03-15: qty 20

## 2023-03-15 MED ORDER — PROPOFOL 10 MG/ML IV BOLUS
INTRAVENOUS | Status: DC | PRN
  Administered 2023-03-15: 180 mg via INTRAVENOUS

## 2023-03-15 MED ORDER — PHENYLEPHRINE 80 MCG/ML (10ML) SYRINGE FOR IV PUSH (FOR BLOOD PRESSURE SUPPORT)
PREFILLED_SYRINGE | INTRAVENOUS | Status: DC | PRN
  Administered 2023-03-15: 80 ug via INTRAVENOUS

## 2023-03-15 MED ORDER — ONDANSETRON HCL 4 MG/2ML IJ SOLN: 4.0000 mg | Freq: Once | INTRAMUSCULAR | Status: DC | PRN

## 2023-03-15 MED ORDER — MEPERIDINE HCL 25 MG/ML IJ SOLN: 6.2500 mg | INTRAMUSCULAR | Status: DC | PRN

## 2023-03-15 MED ORDER — ONDANSETRON HCL 4 MG/2ML IJ SOLN
INTRAMUSCULAR | Status: DC | PRN
  Administered 2023-03-15: 4 mg via INTRAVENOUS

## 2023-03-15 MED ORDER — HEPARIN SOD (PORK) LOCK FLUSH 100 UNIT/ML IV SOLN
INTRAVENOUS | Status: DC | PRN
  Administered 2023-03-15: 500 [IU] via INTRAVENOUS

## 2023-03-15 MED ORDER — HEPARIN 6000 UNIT IRRIGATION SOLUTION
Status: AC
  Filled 2023-03-15: qty 500

## 2023-03-15 MED ORDER — LIDOCAINE 2% (20 MG/ML) 5 ML SYRINGE
INTRAMUSCULAR | Status: DC | PRN
  Administered 2023-03-15: 40 mg via INTRAVENOUS

## 2023-03-15 MED ORDER — MIDAZOLAM HCL 2 MG/2ML IJ SOLN
INTRAMUSCULAR | Status: AC
  Filled 2023-03-15: qty 2

## 2023-03-15 MED ORDER — AMISULPRIDE (ANTIEMETIC) 5 MG/2ML IV SOLN: 10.0000 mg | Freq: Once | INTRAVENOUS | Status: DC | PRN

## 2023-03-15 MED ORDER — DEXAMETHASONE SODIUM PHOSPHATE 10 MG/ML IJ SOLN
INTRAMUSCULAR | Status: AC
  Filled 2023-03-15: qty 1

## 2023-03-15 MED ORDER — ACETAMINOPHEN 500 MG PO TABS
1000.0000 mg | ORAL_TABLET | ORAL | Status: AC
  Administered 2023-03-15: 1000 mg via ORAL
  Filled 2023-03-15: qty 2

## 2023-03-15 MED ORDER — OXYCODONE HCL 5 MG PO TABS: 5.0000 mg | ORAL_TABLET | Freq: Once | ORAL | Status: AC | PRN

## 2023-03-15 MED ORDER — HEPARIN SOD (PORK) LOCK FLUSH 100 UNIT/ML IV SOLN
INTRAVENOUS | Status: AC
  Filled 2023-03-15: qty 5

## 2023-03-15 MED ORDER — KETOROLAC TROMETHAMINE 30 MG/ML IJ SOLN
30.0000 mg | Freq: Once | INTRAMUSCULAR | Status: AC | PRN
  Administered 2023-03-15: 30 mg via INTRAVENOUS

## 2023-03-15 MED ORDER — CHLORHEXIDINE GLUCONATE CLOTH 2 % EX PADS: 6.0000 | MEDICATED_PAD | Freq: Once | CUTANEOUS | Status: DC

## 2023-03-15 MED ORDER — LIDOCAINE 2% (20 MG/ML) 5 ML SYRINGE
INTRAMUSCULAR | Status: AC
  Filled 2023-03-15: qty 5

## 2023-03-15 MED ORDER — CHLORHEXIDINE GLUCONATE 0.12 % MT SOLN
15.0000 mL | Freq: Once | OROMUCOSAL | Status: AC
  Administered 2023-03-15: 15 mL via OROMUCOSAL
  Filled 2023-03-15: qty 15

## 2023-03-15 MED ORDER — EPHEDRINE SULFATE-NACL 50-0.9 MG/10ML-% IV SOSY
PREFILLED_SYRINGE | INTRAVENOUS | Status: DC | PRN
  Administered 2023-03-15: 10 mg via INTRAVENOUS

## 2023-03-15 MED ORDER — DEXAMETHASONE SODIUM PHOSPHATE 10 MG/ML IJ SOLN
INTRAMUSCULAR | Status: DC | PRN
  Administered 2023-03-15: 10 mg via INTRAVENOUS

## 2023-03-15 MED FILL — Fosaprepitant Dimeglumine For IV Infusion 150 MG (Base Eq): INTRAVENOUS | Qty: 5 | Status: AC

## 2023-03-15 MED FILL — Dexamethasone Sodium Phosphate Inj 100 MG/10ML: INTRAMUSCULAR | Qty: 1 | Status: AC

## 2023-03-15 SURGICAL SUPPLY — 45 items
ADH SKN CLS APL DERMABOND .7 (GAUZE/BANDAGES/DRESSINGS) ×1
APL PRP STRL LF DISP 70% ISPRP (MISCELLANEOUS) ×1
BAG COUNTER SPONGE SURGICOUNT (BAG) ×1 IMPLANT
BAG DECANTER FOR FLEXI CONT (MISCELLANEOUS) ×1 IMPLANT
BAG SPNG CNTER NS LX DISP (BAG) ×1
CHLORAPREP W/TINT 26 (MISCELLANEOUS) ×1 IMPLANT
COVER PROBE W GEL 5X96 (DRAPES) ×1 IMPLANT
COVER SURGICAL LIGHT HANDLE (MISCELLANEOUS) ×1 IMPLANT
DERMABOND ADVANCED .7 DNX12 (GAUZE/BANDAGES/DRESSINGS) ×1 IMPLANT
DRAPE C-ARM 42X120 X-RAY (DRAPES) ×1 IMPLANT
DRAPE CHEST BREAST 15X10 FENES (DRAPES) ×1 IMPLANT
DRSG TEGADERM 4X4.75 (GAUZE/BANDAGES/DRESSINGS) IMPLANT
ELECT CAUTERY BLADE 6.4 (BLADE) ×1 IMPLANT
ELECT REM PT RETURN 9FT ADLT (ELECTROSURGICAL) ×1
ELECTRODE REM PT RTRN 9FT ADLT (ELECTROSURGICAL) ×1 IMPLANT
GAUZE 4X4 16PLY ~~LOC~~+RFID DBL (SPONGE) ×1 IMPLANT
GAUZE SPONGE 4X4 12PLY STRL (GAUZE/BANDAGES/DRESSINGS) IMPLANT
GEL ULTRASOUND 20GR AQUASONIC (MISCELLANEOUS) ×1 IMPLANT
GLOVE BIO SURGEON STRL SZ7 (GLOVE) ×1 IMPLANT
GLOVE BIOGEL PI IND STRL 7.5 (GLOVE) ×1 IMPLANT
GOWN STRL REUS W/ TWL LRG LVL3 (GOWN DISPOSABLE) ×2 IMPLANT
GOWN STRL REUS W/TWL LRG LVL3 (GOWN DISPOSABLE) ×2
INTRODUCER COOK 11FR (CATHETERS) IMPLANT
KIT BASIN OR (CUSTOM PROCEDURE TRAY) ×1 IMPLANT
KIT PORT POWER 8FR ISP CVUE (Port) ×1 IMPLANT
KIT TURNOVER KIT B (KITS) ×1 IMPLANT
NS IRRIG 1000ML POUR BTL (IV SOLUTION) ×1 IMPLANT
PAD ARMBOARD 7.5X6 YLW CONV (MISCELLANEOUS) ×2 IMPLANT
PENCIL BUTTON HOLSTER BLD 10FT (ELECTRODE) ×1 IMPLANT
POSITIONER HEAD DONUT 9IN (MISCELLANEOUS) ×1 IMPLANT
SET INTRODUCER 12FR PACEMAKER (INTRODUCER) IMPLANT
SET SHEATH INTRODUCER 10FR (MISCELLANEOUS) IMPLANT
SHEATH COOK PEEL AWAY SET 9F (SHEATH) IMPLANT
SPIKE FLUID TRANSFER (MISCELLANEOUS) ×1 IMPLANT
STRIP CLOSURE SKIN 1/2X4 (GAUZE/BANDAGES/DRESSINGS) ×1 IMPLANT
SUT MNCRL AB 4-0 PS2 18 (SUTURE) ×1 IMPLANT
SUT PROLENE 2 0 SH DA (SUTURE) ×1 IMPLANT
SUT SILK 2 0 (SUTURE)
SUT SILK 2-0 18XBRD TIE 12 (SUTURE) IMPLANT
SUT VIC AB 3-0 SH 27 (SUTURE) ×1
SUT VIC AB 3-0 SH 27XBRD (SUTURE) ×1 IMPLANT
SYR 5ML LUER SLIP (SYRINGE) ×1 IMPLANT
TOWEL GREEN STERILE (TOWEL DISPOSABLE) ×1 IMPLANT
TOWEL GREEN STERILE FF (TOWEL DISPOSABLE) ×1 IMPLANT
TRAY LAPAROSCOPIC MC (CUSTOM PROCEDURE TRAY) ×1 IMPLANT

## 2023-03-15 NOTE — Interval H&P Note (Signed)
History and Physical Interval Note:  03/15/2023 12:22 PM Oncotype high, plan for port for chemotherapy Sue Beasley  has presented today for surgery, with the diagnosis of BREAST CANCER.  The various methods of treatment have been discussed with the patient and family. After consideration of risks, benefits and other options for treatment, the patient has consented to  Procedure(s): INSERTION PORT-A-CATH (N/A) as a surgical intervention.  The patient's history has been reviewed, patient examined, no change in status, stable for surgery.  I have reviewed the patient's chart and labs.  Questions were answered to the patient's satisfaction.     Emelia Loron

## 2023-03-15 NOTE — Anesthesia Procedure Notes (Signed)
Procedure Name: LMA Insertion Date/Time: 03/15/2023 3:57 PM  Performed by: Orlin Hilding, CRNAPre-anesthesia Checklist: Patient identified, Emergency Drugs available, Suction available, Timeout performed and Patient being monitored Patient Re-evaluated:Patient Re-evaluated prior to induction Oxygen Delivery Method: Circle system utilized Preoxygenation: Pre-oxygenation with 100% oxygen Induction Type: IV induction LMA: LMA with gastric port inserted LMA Size: 4.0 Number of attempts: 1 Placement Confirmation: positive ETCO2 and breath sounds checked- equal and bilateral Tube secured with: Tape

## 2023-03-15 NOTE — Op Note (Signed)
Preoperative diagnosis: Stage I breast cancer with high Oncotype Postoperative diagnosis: Same as above Procedure: Right internal jugular vein port placement with ultrasound guidance Surgeon: Dr. Harden Mo Anesthesia: General Estimated blood loss: Minimal Complications: None Special counts correct completion Disposition recovery stable condition  Indications: This is a 52 year old female who recently has undergone a lumpectomy and sentinel lymph node biopsy.  She has a high Oncotype and is due to begin chemotherapy tomorrow.  We discussed venous access.  Procedure: After informed consent was obtained she was taken to the operating room.  She was given antibiotics.  SCDs were placed.  She was placed under general anesthesia with an LMA.  She was then appropriately positioned with her arms tucked.  She was then prepped and draped in the standard sterile surgical fashion.  Surgical timeout was then performed.  Identify the right internal jugular vein with ultrasound.  I made a small nick in the skin and accessed it on the first pass.  The wire was placed.  This was confirmed to be in the internal jugular vein both by ultrasound and in good position by fluoroscopy.  I then made an incision overlying the right chest.  I made a pocket.  I tunneled the line between the 2 sites.  I then placed the dilator over the wire under direct vision.  The wire assembly was removed.  I passed the line through the sheath.  This was pulled back to be in the distal vena cava.  This is in good position and is ready for use.  I then connected the line to the port.  This was sutured into position with a 2-0 Prolene suture.  I then closed this with 3-0 Vicryl for Monocryl.  Glue and Steri-Strips were applied.  I accessed the port and it flushed easily and aspirated blood.  I packed heparin inside of it.  I left it accessed as she is going to begin chemotherapy tomorrow.  She tolerated this well was transferred recovery  stable.

## 2023-03-15 NOTE — Transfer of Care (Signed)
Immediate Anesthesia Transfer of Care Note  Patient: Sue Beasley  Procedure(s) Performed: INSERTION PORT-A-CATH (Right: Neck)  Patient Location: PACU  Anesthesia Type:General  Level of Consciousness: drowsy  Airway & Oxygen Therapy: Patient Spontanous Breathing and Patient connected to face mask oxygen  Post-op Assessment: Report given to RN and Post -op Vital signs reviewed and stable  Post vital signs: Reviewed and stable  Last Vitals:  Vitals Value Taken Time  BP 114/54 03/15/23 1645  Temp    Pulse 74 03/15/23 1646  Resp 15 03/15/23 1646  SpO2 98 % 03/15/23 1646  Vitals shown include unvalidated device data.  Last Pain:  Vitals:   03/15/23 1326  TempSrc:   PainSc: 0-No pain         Complications: No notable events documented.

## 2023-03-15 NOTE — Anesthesia Preprocedure Evaluation (Addendum)
Anesthesia Evaluation  Patient identified by MRN, date of birth, ID band Patient awake    Reviewed: Allergy & Precautions, H&P , NPO status , Patient's Chart, lab work & pertinent test results  Airway Mallampati: I  TM Distance: >3 FB Neck ROM: Full    Dental  (+) Teeth Intact, Dental Advisory Given   Pulmonary asthma (well controlled, rarely uses inhaler)    Pulmonary exam normal breath sounds clear to auscultation       Cardiovascular negative cardio ROS Normal cardiovascular exam Rhythm:Regular Rate:Normal  Echo 2015 for murmur: normal   Neuro/Psych negative neurological ROS  negative psych ROS   GI/Hepatic negative GI ROS, Neg liver ROS,,,  Endo/Other  Obesity BMI 39  Renal/GU negative Renal ROS  negative genitourinary   Musculoskeletal negative musculoskeletal ROS (+)    Abdominal  (+) + obese  Peds negative pediatric ROS (+)  Hematology negative hematology ROS (+) Hb 12.1   Anesthesia Other Findings Breast ca   Reproductive/Obstetrics negative OB ROS                             Anesthesia Physical Anesthesia Plan  ASA: 2  Anesthesia Plan: General   Post-op Pain Management: Tylenol PO (pre-op)*   Induction: Intravenous  PONV Risk Score and Plan: 3 and Ondansetron, Dexamethasone, Midazolam and Treatment may vary due to age or medical condition  Airway Management Planned: LMA  Additional Equipment: None  Intra-op Plan:   Post-operative Plan: Extubation in OR  Informed Consent: I have reviewed the patients History and Physical, chart, labs and discussed the procedure including the risks, benefits and alternatives for the proposed anesthesia with the patient or authorized representative who has indicated his/her understanding and acceptance.     Dental advisory given  Plan Discussed with: CRNA  Anesthesia Plan Comments:        Anesthesia Quick Evaluation

## 2023-03-15 NOTE — Discharge Instructions (Signed)
PORT-A-CATH: POST OP INSTRUCTIONS  Always review your discharge instruction sheet given to you by the facility where your surgery was performed.   A prescription for pain medication may be given to you upon discharge. Take your pain medication as prescribed, if needed. If narcotic pain medicine is not needed, then you make take acetaminophen (Tylenol) or ibuprofen (Advil) as needed.  Take your usually prescribed medications unless otherwise directed. If you need a refill on your pain medication, please contact our office. All narcotic pain medicine now requires a paper prescription.  Phoned in and fax refills are no longer allowed by law.  Prescriptions will not be filled after 5 pm or on weekends.  You should follow a light diet for the remainder of the day after your procedure. Most patients will experience some mild swelling and/or bruising in the area of the incision. It may take several days to resolve. It is common to experience some constipation if taking pain medication after surgery. Increasing fluid intake and taking a stool softener (such as Colace) will usually help or prevent this problem from occurring. A mild laxative (Milk of Magnesia or Miralax) should be taken according to package directions if there are no bowel movements after 48 hours.  Unless discharge instructions indicate otherwise, you may remove your bandages 48 hours after surgery, and you may shower at that time. You may have steri-strips (small white skin tapes) in place directly over the incision.  These strips should be left on the skin for 7-10 days.  If your surgeon used Dermabond (skin glue) on the incision, you may shower in 24 hours.  The glue will flake off over the next 2-3 weeks.  If your port is left accessed at the end of surgery (needle left in port), the dressing cannot get wet and should only by changed by a healthcare professional. When the port is no longer accessed (when the needle has been removed), follow  step 7.   ACTIVITIES:  Limit activity involving your arms for the next 72 hours. Do no strenuous exercise or activity for 1 week. You may drive when you are no longer taking prescription pain medication, you can comfortably wear a seatbelt, and you can maneuver your car. 10.You may need to see your doctor in the office for a follow-up appointment.  Please       check with your doctor.  11.When you receive a new Port-a-Cath, you will get a product guide and        ID card.  Please keep them in case you need them.  WHEN TO CALL YOUR DOCTOR ([phone]): Fever over 101.0 Chills Continued bleeding from incision Increased redness and tenderness at the site Shortness of breath, difficulty breathing   The clinic staff is available to answer your questions during regular business hours. Please don't hesitate to call and ask to speak to one of the nurses or medical assistants for clinical concerns. If you have a medical emergency, go to the nearest emergency room or call 911.  A surgeon from [facility] is always on call at the hospital.     For further information, please visit [email or web address]      

## 2023-03-16 ENCOUNTER — Inpatient Hospital Stay: Payer: BC Managed Care – PPO

## 2023-03-16 ENCOUNTER — Encounter: Payer: Self-pay | Admitting: General Practice

## 2023-03-16 ENCOUNTER — Encounter (HOSPITAL_COMMUNITY): Payer: Self-pay | Admitting: General Surgery

## 2023-03-16 VITALS — BP 151/81 | HR 60 | Temp 98.5°F | Resp 18

## 2023-03-16 DIAGNOSIS — Z170 Estrogen receptor positive status [ER+]: Secondary | ICD-10-CM

## 2023-03-16 DIAGNOSIS — Z5111 Encounter for antineoplastic chemotherapy: Secondary | ICD-10-CM | POA: Diagnosis not present

## 2023-03-16 LAB — CBC WITH DIFFERENTIAL (CANCER CENTER ONLY)
Abs Immature Granulocytes: 0.05 10*3/uL (ref 0.00–0.07)
Basophils Absolute: 0 10*3/uL (ref 0.0–0.1)
Basophils Relative: 0 %
Eosinophils Absolute: 0 10*3/uL (ref 0.0–0.5)
Eosinophils Relative: 0 %
HCT: 35.5 % — ABNORMAL LOW (ref 36.0–46.0)
Hemoglobin: 11.5 g/dL — ABNORMAL LOW (ref 12.0–15.0)
Immature Granulocytes: 1 %
Lymphocytes Relative: 11 %
Lymphs Abs: 1.1 10*3/uL (ref 0.7–4.0)
MCH: 22.5 pg — ABNORMAL LOW (ref 26.0–34.0)
MCHC: 32.4 g/dL (ref 30.0–36.0)
MCV: 69.3 fL — ABNORMAL LOW (ref 80.0–100.0)
Monocytes Absolute: 0.2 10*3/uL (ref 0.1–1.0)
Monocytes Relative: 2 %
Neutro Abs: 8.8 10*3/uL — ABNORMAL HIGH (ref 1.7–7.7)
Neutrophils Relative %: 86 %
Platelet Count: 303 10*3/uL (ref 150–400)
RBC: 5.12 MIL/uL — ABNORMAL HIGH (ref 3.87–5.11)
RDW: 15.3 % (ref 11.5–15.5)
WBC Count: 10.2 10*3/uL (ref 4.0–10.5)
nRBC: 0 % (ref 0.0–0.2)

## 2023-03-16 LAB — CMP (CANCER CENTER ONLY)
ALT: 50 U/L — ABNORMAL HIGH (ref 0–44)
AST: 25 U/L (ref 15–41)
Albumin: 4 g/dL (ref 3.5–5.0)
Alkaline Phosphatase: 130 U/L — ABNORMAL HIGH (ref 38–126)
Anion gap: 10 (ref 5–15)
BUN: 12 mg/dL (ref 6–20)
CO2: 23 mmol/L (ref 22–32)
Calcium: 8.8 mg/dL — ABNORMAL LOW (ref 8.9–10.3)
Chloride: 104 mmol/L (ref 98–111)
Creatinine: 0.81 mg/dL (ref 0.44–1.00)
GFR, Estimated: >60 mL/min (ref >60)
Glucose, Bld: 218 mg/dL — ABNORMAL HIGH (ref 70–99)
Potassium: 3.8 mmol/L (ref 3.5–5.1)
Sodium: 137 mmol/L (ref 135–145)
Total Bilirubin: 0.3 mg/dL (ref 0.3–1.2)
Total Protein: 7.3 g/dL (ref 6.5–8.1)

## 2023-03-16 LAB — PREGNANCY, URINE: Preg Test, Ur: NEGATIVE

## 2023-03-16 MED ORDER — SODIUM CHLORIDE 0.9 % IV SOLN
10.0000 mg | Freq: Once | INTRAVENOUS | Status: AC
  Administered 2023-03-16: 10 mg via INTRAVENOUS
  Filled 2023-03-16: qty 10

## 2023-03-16 MED ORDER — SODIUM CHLORIDE 0.9 % IV SOLN: Freq: Once | INTRAVENOUS | Status: AC

## 2023-03-16 MED ORDER — PALONOSETRON HCL INJECTION 0.25 MG/5ML
0.2500 mg | Freq: Once | INTRAVENOUS | Status: AC
  Administered 2023-03-16: 0.25 mg via INTRAVENOUS
  Filled 2023-03-16: qty 5

## 2023-03-16 MED ORDER — SODIUM CHLORIDE 0.9% FLUSH
10.0000 mL | INTRAVENOUS | Status: DC | PRN
  Administered 2023-03-16: 10 mL

## 2023-03-16 MED ORDER — SODIUM CHLORIDE 0.9 % IV SOLN
150.0000 mg | Freq: Once | INTRAVENOUS | Status: AC
  Administered 2023-03-16: 150 mg via INTRAVENOUS
  Filled 2023-03-16: qty 150

## 2023-03-16 MED ORDER — SODIUM CHLORIDE 0.9 % IV SOLN
75.0000 mg/m2 | Freq: Once | INTRAVENOUS | Status: AC
  Administered 2023-03-16: 160 mg via INTRAVENOUS
  Filled 2023-03-16: qty 16

## 2023-03-16 MED ORDER — HEPARIN SOD (PORK) LOCK FLUSH 100 UNIT/ML IV SOLN
500.0000 [IU] | Freq: Once | INTRAVENOUS | Status: AC | PRN
  Administered 2023-03-16: 500 [IU]

## 2023-03-16 MED ORDER — SODIUM CHLORIDE 0.9 % IV SOLN
600.0000 mg/m2 | Freq: Once | INTRAVENOUS | Status: AC
  Administered 2023-03-16: 1200 mg via INTRAVENOUS
  Filled 2023-03-16: qty 60

## 2023-03-16 NOTE — Progress Notes (Signed)
[facility] Spiritual Care Note  Followed up at first infusion for pastoral check-in. Sue Beasley was in good spirits, especially now that she is seeing herself take action with treatment. No particular needs at this time, but being remembered is meaningful to her.  Brought pick-me-up package of little gifts of encouragement, including a handmade blessing blanket as a tangible sign of support. Provided pastoral presence, empathic listening, normalization of feelings, and emotional support.  We plan to follow up at a future treatment, and she knows to contact Spiritual Care if needs arise in the meantime.   [address] [place], [place], [place] Pager [phone] Voicemail [phone]

## 2023-03-16 NOTE — Patient Instructions (Signed)
[facility]  Discharge Instructions: Thank you for choosing [facility] to provide your oncology and hematology care.   If you have a lab appointment with the Cancer Center, please go directly to the Cancer Center and check in at the registration area.   Wear comfortable clothing and clothing appropriate for easy access to any Portacath or PICC line.   We strive to give you quality time with your provider. You may need to reschedule your appointment if you arrive late (15 or more minutes).  Arriving late affects you and other patients whose appointments are after yours.  Also, if you miss three or more appointments without notifying the office, you may be dismissed from the clinic at the provider's discretion.      For prescription refill requests, have your pharmacy contact our office and allow 72 hours for refills to be completed.    Today you received the following chemotherapy and/or immunotherapy agents docetaxel, cytoxan      To help prevent nausea and vomiting after your treatment, we encourage you to take your nausea medication as directed.  BELOW ARE SYMPTOMS THAT SHOULD BE REPORTED IMMEDIATELY: *FEVER GREATER THAN 100.4 F (38 C) OR HIGHER *CHILLS OR SWEATING *NAUSEA AND VOMITING THAT IS NOT CONTROLLED WITH YOUR NAUSEA MEDICATION *UNUSUAL SHORTNESS OF BREATH *UNUSUAL BRUISING OR BLEEDING *URINARY PROBLEMS (pain or burning when urinating, or frequent urination) *BOWEL PROBLEMS (unusual diarrhea, constipation, pain near the anus) TENDERNESS IN MOUTH AND THROAT WITH OR WITHOUT PRESENCE OF ULCERS (sore throat, sores in mouth, or a toothache) UNUSUAL RASH, SWELLING OR PAIN  UNUSUAL VAGINAL DISCHARGE OR ITCHING   Items with * indicate a potential emergency and should be followed up as soon as possible or go to the Emergency Department if any problems should occur.  Please show the CHEMOTHERAPY ALERT CARD or IMMUNOTHERAPY ALERT CARD  at check-in to the Emergency Department and triage nurse.  Should you have questions after your visit or need to cancel or reschedule your appointment, please contact [facility]  Dept: [phone]  and follow the prompts.  Office hours are 8:00 a.m. to 4:30 p.m. Monday - Friday. Please note that voicemails left after 4:00 p.m. may not be returned until the following business day.  We are closed weekends and major holidays. You have access to a nurse at all times for urgent questions. Please call the main number to the clinic Dept: [phone] and follow the prompts.   For any non-urgent questions, you may also contact your provider using MyChart. We now offer e-Visits for anyone 18 and older to request care online for non-urgent symptoms. For details visit [email or web address].   Also download the MyChart app! Go to the app store, search "MyChart", open the app, select , and log in with your MyChart username and password.   

## 2023-03-17 ENCOUNTER — Telehealth: Payer: Self-pay

## 2023-03-17 NOTE — Telephone Encounter (Signed)
-----   Message from Edger House, RN sent at 03/16/2023  1:38 PM EDT ----- Regarding: FT chemo Gudena FT TC chemo. Dr. Pamelia Hoit. Pt tolerated well. Finished treatment.

## 2023-03-17 NOTE — Telephone Encounter (Signed)
Sue Beasley is doing fine. Sue Beasley states that she is eating, drinking and urinating well. She knows to call the office at [phone] if she has any questions or concerns.

## 2023-03-17 NOTE — Anesthesia Postprocedure Evaluation (Signed)
Anesthesia Post Note  Patient: Sue Beasley  Procedure(s) Performed: INSERTION PORT-A-CATH (Right: Neck)     Patient location during evaluation: PACU Anesthesia Type: General Level of consciousness: awake and alert Pain management: pain level controlled Vital Signs Assessment: post-procedure vital signs reviewed and stable Respiratory status: spontaneous breathing, nonlabored ventilation and respiratory function stable Cardiovascular status: blood pressure returned to baseline and stable Postop Assessment: no apparent nausea or vomiting Anesthetic complications: no   No notable events documented.  Last Vitals:  Vitals:   03/15/23 1700 03/15/23 1715  BP: (!) 145/77 (!) 147/72  Pulse: 67 (!) 52  Resp: 12 13  Temp:  (!) 36.3 C  SpO2: 97% 99%    Last Pain:  Vitals:   03/15/23 1715  TempSrc:   PainSc: 3                  Rebbeca Sheperd

## 2023-03-18 ENCOUNTER — Inpatient Hospital Stay: Payer: BC Managed Care – PPO

## 2023-03-18 ENCOUNTER — Other Ambulatory Visit: Payer: Self-pay

## 2023-03-18 VITALS — BP 142/55 | HR 74 | Temp 98.7°F | Resp 18

## 2023-03-18 DIAGNOSIS — Z170 Estrogen receptor positive status [ER+]: Secondary | ICD-10-CM

## 2023-03-18 DIAGNOSIS — Z5111 Encounter for antineoplastic chemotherapy: Secondary | ICD-10-CM | POA: Diagnosis not present

## 2023-03-18 MED ORDER — PEGFILGRASTIM-JMDB 6 MG/0.6ML ~~LOC~~ SOSY
6.0000 mg | PREFILLED_SYRINGE | Freq: Once | SUBCUTANEOUS | Status: AC
  Administered 2023-03-18: 6 mg via SUBCUTANEOUS
  Filled 2023-03-18: qty 0.6

## 2023-03-18 NOTE — Patient Instructions (Signed)

## 2023-03-22 ENCOUNTER — Encounter: Payer: Self-pay | Admitting: *Deleted

## 2023-03-23 ENCOUNTER — Inpatient Hospital Stay (HOSPITAL_BASED_OUTPATIENT_CLINIC_OR_DEPARTMENT_OTHER): Payer: BC Managed Care – PPO | Admitting: Adult Health

## 2023-03-23 ENCOUNTER — Inpatient Hospital Stay: Payer: BC Managed Care – PPO

## 2023-03-23 ENCOUNTER — Encounter: Payer: Self-pay | Admitting: Adult Health

## 2023-03-23 ENCOUNTER — Encounter: Payer: Self-pay | Admitting: Hematology and Oncology

## 2023-03-23 ENCOUNTER — Encounter (HOSPITAL_COMMUNITY): Payer: Self-pay

## 2023-03-23 ENCOUNTER — Other Ambulatory Visit: Payer: Self-pay

## 2023-03-23 ENCOUNTER — Emergency Department (HOSPITAL_COMMUNITY)
Admission: EM | Admit: 2023-03-23 | Discharge: 2023-03-24 | Disposition: A | Discharge status: Home / Self Care | Payer: BC Managed Care – PPO | Attending: Emergency Medicine | Admitting: Emergency Medicine

## 2023-03-23 ENCOUNTER — Telehealth: Payer: Self-pay

## 2023-03-23 VITALS — BP 142/58 | HR 95 | Temp 98.1°F | Resp 18 | Ht 61.0 in | Wt 202.9 lb

## 2023-03-23 DIAGNOSIS — Z1152 Encounter for screening for COVID-19: Secondary | ICD-10-CM | POA: Insufficient documentation

## 2023-03-23 DIAGNOSIS — C50412 Malignant neoplasm of upper-outer quadrant of left female breast: Secondary | ICD-10-CM | POA: Diagnosis not present

## 2023-03-23 DIAGNOSIS — Z91010 Allergy to peanuts: Secondary | ICD-10-CM | POA: Insufficient documentation

## 2023-03-23 DIAGNOSIS — Z170 Estrogen receptor positive status [ER+]: Secondary | ICD-10-CM

## 2023-03-23 DIAGNOSIS — D709 Neutropenia, unspecified: Secondary | ICD-10-CM | POA: Insufficient documentation

## 2023-03-23 DIAGNOSIS — C50919 Malignant neoplasm of unspecified site of unspecified female breast: Secondary | ICD-10-CM | POA: Diagnosis not present

## 2023-03-23 DIAGNOSIS — R509 Fever, unspecified: Secondary | ICD-10-CM | POA: Diagnosis present

## 2023-03-23 DIAGNOSIS — Z95828 Presence of other vascular implants and grafts: Secondary | ICD-10-CM

## 2023-03-23 LAB — CBC WITH DIFFERENTIAL (CANCER CENTER ONLY)
Abs Immature Granulocytes: 0 10*3/uL (ref 0.00–0.07)
Basophils Absolute: 0 10*3/uL (ref 0.0–0.1)
Basophils Relative: 4 %
Eosinophils Absolute: 0 10*3/uL (ref 0.0–0.5)
Eosinophils Relative: 1 %
HCT: 35.8 % — ABNORMAL LOW (ref 36.0–46.0)
Hemoglobin: 11.5 g/dL — ABNORMAL LOW (ref 12.0–15.0)
Immature Granulocytes: 0 %
Lymphocytes Relative: 83 %
Lymphs Abs: 0.7 10*3/uL (ref 0.7–4.0)
MCH: 21.8 pg — ABNORMAL LOW (ref 26.0–34.0)
MCHC: 32.1 g/dL (ref 30.0–36.0)
MCV: 67.9 fL — ABNORMAL LOW (ref 80.0–100.0)
Monocytes Absolute: 0.1 10*3/uL (ref 0.1–1.0)
Monocytes Relative: 11 %
Neutro Abs: 0 10*3/uL — CL (ref 1.7–7.7)
Neutrophils Relative %: 1 %
Platelet Count: 305 10*3/uL (ref 150–400)
RBC: 5.27 MIL/uL — ABNORMAL HIGH (ref 3.87–5.11)
RDW: 14.7 % (ref 11.5–15.5)
Smear Review: NORMAL
WBC Count: 0.8 10*3/uL — CL (ref 4.0–10.5)
nRBC: 0 % (ref 0.0–0.2)

## 2023-03-23 LAB — CMP (CANCER CENTER ONLY)
ALT: 23 U/L (ref 0–44)
AST: 21 U/L (ref 15–41)
Albumin: 4 g/dL (ref 3.5–5.0)
Alkaline Phosphatase: 121 U/L (ref 38–126)
Anion gap: 7 (ref 5–15)
BUN: 8 mg/dL (ref 6–20)
CO2: 27 mmol/L (ref 22–32)
Calcium: 8.6 mg/dL — ABNORMAL LOW (ref 8.9–10.3)
Chloride: 102 mmol/L (ref 98–111)
Creatinine: 0.68 mg/dL (ref 0.44–1.00)
GFR, Estimated: >60 mL/min (ref >60)
Glucose, Bld: 108 mg/dL — ABNORMAL HIGH (ref 70–99)
Potassium: 3.8 mmol/L (ref 3.5–5.1)
Sodium: 136 mmol/L (ref 135–145)
Total Bilirubin: 0.3 mg/dL (ref 0.3–1.2)
Total Protein: 7.6 g/dL (ref 6.5–8.1)

## 2023-03-23 MED ORDER — SODIUM CHLORIDE 0.9% FLUSH
10.0000 mL | Freq: Once | INTRAVENOUS | Status: AC
  Administered 2023-03-23: 10 mL

## 2023-03-23 MED ORDER — HEPARIN SOD (PORK) LOCK FLUSH 100 UNIT/ML IV SOLN
500.0000 [IU] | Freq: Once | INTRAVENOUS | Status: AC
  Administered 2023-03-23: 500 [IU]

## 2023-03-23 NOTE — Assessment & Plan Note (Signed)
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 Oncotype DX recurrence score: 36 (distant recurrence at 9 years: 24%)  Recommendation: Adjuvant chemotherapy with Taxotere and Cytoxan every 3 weeks x 4 Adjuvant radiation therapy Adjuvant antiestrogen therapy  Current Treatment: Taxotere/Cytoxan cycle 1 day 8  Chemo toxicities:  Fatigue: Managing with energy conservation Bone pain: Secondary to growth factor injection resolved with Tylenol Chemotherapy-induced neutropenia: Discussed neutropenic precautions in detail.  Itching from Tegaderm: Messaging placed to ensure sensitive skin dressings are used over her port moving forward.  She will return in 2 weeks for labs, follow-up, and her next infusion.

## 2023-03-23 NOTE — ED Provider Notes (Addendum)
[place] EMERGENCY DEPARTMENT AT [facility] Provider Note   CSN: [id number] Arrival date & time: 03/23/23  2010     History  Chief Complaint  Patient presents with   Otalgia    Sue Beasley is a 52 y.o. female who is currently undergoing chemotherapy for breast cancer presents to the ED today for fever. Patient reports that she had her blood work done at her oncologist today and she was severely neutropenic with a WBC of 0.8. She was advised to monitor her temperature at home. At 5:30 PM she had a temperature of 100.4 F, she was advised to go to the ED if her fever increased. A couple hours later her fever was 100.7 F, so patient came here. She reports nasal congestion, post-nasal drip, and intermittent left ear pain. No chills, cough, sore throat, shortness of breath, chest or abdominal pain. Patient has not taken anything for her symptoms. No other complaints or concerns at this time.    Home Medications Prior to Admission medications   Medication Sig Start Date End Date Taking? Authorizing Provider  ciprofloxacin (CIPRO) 500 MG tablet Take 1 tablet (500 mg total) by mouth every 12 (twelve) hours for 7 days. 03/24/23 03/31/23 Yes Cardama, Amadeo Garnet, MD  albuterol (VENTOLIN HFA) 108 (90 Base) MCG/ACT inhaler Inhale 2 puffs into the lungs every 6 (six) hours as needed for shortness of breath or wheezing.    [provider]  azelastine (ASTELIN) 0.1 % nasal spray Place 1 spray into both nostrils in the morning. 08/14/15   [provider]  desoximetasone (TOPICORT) 0.25 % cream Apply 1 Application topically 2 (two) times daily as needed (eczema).    [provider]  dexamethasone (DECADRON) 4 MG tablet Take 1 tablet (4 mg total) by mouth daily. Take 1 tab day before chemo and 1 tab day after 03/08/23   Serena Croissant, MD  diphenhydrAMINE (BENADRYL) 25 MG tablet Take 25 mg by mouth daily as needed (food allergies (with restaurant eating)).     [provider]  dorzolamide-timolol (COSOPT) 22.3-6.8 MG/ML ophthalmic solution Place 1 drop into both eyes 2 (two) times daily. 11/01/20   [provider]  EPINEPHrine 0.3 mg/0.3 mL IJ SOAJ injection Inject 0.3 mg into the muscle as needed for anaphylaxis. 11/27/19   [provider]  ferrous sulfate 325 (65 FE) MG tablet Take 325 mg by mouth 3 (three) times a week.    [provider]  fluticasone (FLONASE) 50 MCG/ACT nasal spray Place 2 sprays into both nostrils in the morning. 08/14/15   [provider]  fluticasone-salmeterol (WIXELA INHUB) 250-50 MCG/ACT AEPB Inhale 1 puff into the lungs in the morning.    [provider]  ketotifen (ZADITOR) 0.035 % ophthalmic solution Place 1-2 drops into both eyes 2 (two) times daily as needed (allergies.).    [provider]  latanoprost (XALATAN) 0.005 % ophthalmic solution Place 1 drop into both eyes at bedtime. 11/13/20   [provider]  lidocaine-prilocaine (EMLA) cream Apply to affected area once 03/08/23   Serena Croissant, MD  loratadine (CLARITIN) 10 MG tablet Take 10 mg by mouth in the morning.    [provider]  ondansetron (ZOFRAN) 8 MG tablet Take 1 tablet (8 mg total) by mouth every 8 (eight) hours as needed for nausea or vomiting. Start on the third day after chemotherapy. 03/08/23   Serena Croissant, MD  prochlorperazine (COMPAZINE) 10 MG tablet Take 1 tablet (10 mg total) by mouth  every 6 (six) hours as needed for nausea or vomiting. 03/08/23   Serena Croissant, MD  traMADol (ULTRAM) 50 MG tablet Take 1 tablet (50 mg total) by mouth every 6 (six) hours as needed. 02/14/23   Emelia Loron, MD      Allergies    Peanut-containing drug products, Soy allergy, Egg-derived products, Oxycodone, Amoxicillin, Amoxicillin-pot clavulanate, and Shellfish-derived products    Review of Systems   Review of Systems  HENT:  Positive for congestion, ear pain and postnasal drip.         Intermittent left ear pain  All other systems reviewed and are negative.   Physical Exam Updated Vital Signs BP 131/63   Pulse 83   Temp 98.6 F (37 C) (Oral)   Resp 17   LMP 03/14/2023 (Approximate)   SpO2 99%  Physical Exam Vitals and nursing note reviewed.  Constitutional:      Appearance: Normal appearance.  HENT:     Head: Normocephalic and atraumatic.     Right Ear: Tympanic membrane and ear canal normal.     Left Ear: Tympanic membrane and ear canal normal.     Nose: Congestion present.     Mouth/Throat:     Mouth: Mucous membranes are moist.     Pharynx: No oropharyngeal exudate or posterior oropharyngeal erythema.  Eyes:     Conjunctiva/sclera: Conjunctivae normal.     Pupils: Pupils are equal, round, and reactive to light.  Cardiovascular:     Rate and Rhythm: Normal rate and regular rhythm.     Pulses: Normal pulses.     Heart sounds: Normal heart sounds.  Pulmonary:     Effort: Pulmonary effort is normal.     Breath sounds: Normal breath sounds.  Abdominal:     Palpations: Abdomen is soft.     Tenderness: There is no abdominal tenderness.  Musculoskeletal:     Cervical back: Normal range of motion. No rigidity or tenderness.  Lymphadenopathy:     Cervical: No cervical adenopathy.  Skin:    General: Skin is warm and dry.     Findings: No rash.  Neurological:     General: No focal deficit present.     Mental Status: She is alert.  Psychiatric:        Mood and Affect: Mood normal.        Behavior: Behavior normal.     ED Results / Procedures / Treatments   Labs (all labs ordered are listed, but only abnormal results are displayed) Labs Reviewed  CBC WITH DIFFERENTIAL/PLATELET - Abnormal; Notable for the following components:      Result Value   WBC 1.0 (*)    RBC 5.21 (*)    Hemoglobin 11.3 (*)    MCV 69.3 (*)    MCH 21.7 (*)    Neutro Abs 0.0 (*)    All other components within normal limits  COMPREHENSIVE METABOLIC PANEL - Abnormal; Notable  for the following components:   Sodium 134 (*)    Potassium 3.4 (*)    Glucose, Bld 121 (*)    Calcium 8.7 (*)    Total Protein 8.2 (*)    All other components within normal limits  URINALYSIS, W/ REFLEX TO CULTURE (INFECTION SUSPECTED) - Abnormal; Notable for the following components:   Color, Urine STRAW (*)    Specific Gravity, Urine 1.004 (*)    Bacteria, UA RARE (*)    All other components within normal limits  SARS CORONAVIRUS 2 BY RT PCR  CULTURE, BLOOD (ROUTINE X 2)  CULTURE, BLOOD (ROUTINE X 2)    EKG None  Radiology DG Chest 2 View  Result Date: 03/24/2023 CLINICAL DATA:  Fever EXAM: CHEST - 2 VIEW COMPARISON:  None Available. FINDINGS: Right Port-A-Cath in place with the tip in the SVC. Heart and mediastinal contours are within normal limits. No focal opacities or effusions. No acute bony abnormality. IMPRESSION: No active cardiopulmonary disease. Electronically Signed   By: Charlett Nose M.D.   On: 03/24/2023 01:05    Procedures Procedures: not indicated.   Medications Ordered in ED Medications  ceFEPIme (MAXIPIME) 2 g in sodium chloride 0.9 % 100 mL IVPB (0 g Intravenous Stopped 03/24/23 0339)    ED Course/ Medical Decision Making/ A&P Clinical Course as of 03/24/23 0449  Thu Mar 24, 2023  0313 I spoke with Dr. Twanna Hy regarding her presentation and findings. He felt patient was safe for discharge, but recommended a dose of cefepime with Rx of Cipro  [PC]    Clinical Course User Index [PC] Cardama, Amadeo Garnet, MD                             Medical Decision Making Amount and/or Complexity of Data Reviewed Labs: ordered. Radiology: ordered.  Risk Prescription drug management.   This patient presents to the ED for concern of fever, this involves an extensive number of treatment options, and is a complaint that carries with it a high risk of complications and morbidity.  The differential diagnosis includes: URI vs port infection vs otitis media vs  sinusitis  Emergent differentials: pulmonary embolism, pneumonia, sepsis, meningitis. PE ruled out due to: no shortness of breath, tachycardia, leg pain or edema. Pneumonia ruled out: no cough, shortness of breath, chest pain. Unremarkable chest x-ray. Sepsis ruled out due to: no AMS, shortness of breath, hypotension, tachycardia. Meningitis ruled out due to: no headache, neck rigidity, AMS, malaise, light sensitivity.   Co morbidities that complicate the patient evaluation  Undergoing chemotherapy Chemotherapy induced neutropenia Iron deficiency anemia Seasonal allergies   Additional history obtained:  Additional history obtained from patient's oncology notes and lab reports.   Lab Tests:  I ordered and personally interpreted labs.  The pertinent results include:  WBC of 1 and absolute neutrophil count of 0, which is a slight improvement from yesterday. No signs of electrolyte imbalance, kidney injury, or anemia.   Imaging Studies ordered:  I ordered imaging studies including CXR  I independently visualized and interpreted imaging which showed no active cardiopulmonary disease. I agree with the radiologist interpretation   Consultations Obtained:  I requested consultation with oncology,  and discussed lab and imaging findings as well as pertinent plan - they recommend: start antibiotic in ED and give send her home with a prescription. Follow up with oncology outpatient to review blood culture results this week.   Problem List / ED Course / Critical interventions / Medication management  Fever, neutropenia, undergoing chemotherapy Fever improved throughout course of ED visit without medications. Current temperature is 98.4 F. 2g of Cefepime IV given in the ED. Blood cultures pending at time of discharge. Dr. Pamelia Hoit, with oncology consulted. Patient will go home on Cipro and follow up outpatient. She will call his office in the morning. I have reviewed the patients home  medicines and have made adjustments as needed.   Social Determinants of Health:  Social connection Physical activity   Test / Admission - Considered:  Blood  cultures pending at time of discharge.   Patient has to follow-up with Dr. Pamelia Hoit with oncology to discuss results and for further evaluation.  Patient is to call the office in the morning. Patient is being discharged home with Cipro for prophylactic infection treatment due to patient's immunocompromise state. Patient is stable and safe for discharge home.    Final Clinical Impression(s) / ED Diagnoses Final diagnoses:  Fever and neutropenia (HCC)    Rx / DC Orders ED Discharge Orders          Ordered    ciprofloxacin (CIPRO) 500 MG tablet  Every 12 hours        03/24/23 0337              Maxwell Marion, PA-C 03/24/23 0449    Maxwell Marion, PA-C 03/24/23 0735    Nira Conn, MD 03/25/23 [phone]

## 2023-03-23 NOTE — Telephone Encounter (Signed)
"  Please let patient know her kidney/liver function was normal. Please tell her also that Dr. Pamelia Hoit recommends abstaining from undergoing allergy shots/testing during this time period. He thinks that with the immune system suppression and steroids she receives with treatment they will not yield benefit/accuracy." - Lillard Anes, NP  Called pt to make aware of message received from NP. She verbalized understanding and agreement.

## 2023-03-23 NOTE — Progress Notes (Signed)
[facility] Cancer Follow up:    [name], MD [address] [place] [address]   DIAGNOSIS:  Cancer Staging  Malignant neoplasm of upper-outer quadrant of left breast in female, estrogen receptor positive (HCC) Staging form: Breast, AJCC 8th Edition - Clinical: Stage IA (cT1c, cN0, cM0, G3, ER+, PR+, HER2-) - Signed by Serena Croissant, MD on 01/26/2023 Histologic grading system: 3 grade system - Pathologic: Stage IA (pT1c, pN0, cM0, G3, ER+, PR+, HER2-) - Signed by Serena Croissant, MD on 03/01/2023 Histologic grading system: 3 grade system   SUMMARY OF ONCOLOGIC HISTORY: Oncology History  Malignant neoplasm of upper-outer quadrant of left breast in female, estrogen receptor positive (HCC)  01/14/2023 Initial Diagnosis   Screening mammogram detected left breast mass UOQ 12:30 position: 1.1 cm, ultrasound biopsy revealed poorly differentiated IDC grade 3, ER 100%, PR 3%, Ki-67 70%, HER2 negative, axillary lymph node biopsy: Benign concordant   01/26/2023 Cancer Staging   Staging form: Breast, AJCC 8th Edition - Clinical: Stage IA (cT1c, cN0, cM0, G3, ER+, PR+, HER2-) - Signed by Serena Croissant, MD on 01/26/2023 Histologic grading system: 3 grade system   02/03/2023 Genetic Testing   Negative [facility] CustomNext-Cancer +RNAinsight Panel.  Report date is 02/10/2023.   The CustomNext-Cancer+RNAinsight panel offered by [facility] includes sequencing, rearrangement, and RNA analysis for the following 47 genes:  APC, ATM, AXIN2, BAP1, BARD1, BMPR1A, BRCA1, BRCA2, BRIP1, CDH1, CDK4, CDKN2A, CHEK2, CTNNA1, DICER1, EPCAM, FH, GREM1, HOXB13, KIT, MBD4, MEN1, MLH1, MSH2, MSH3, MSH6, MUTYH, NF1, NTHL1, PALB2, PDGFRA, PMS2, POLD1, POLE, PTEN, RAD51C, RAD51D, SDHA, SDHB, SDHC, SDHD, SMAD4, SMARCA4, STK11, TP53, TSC1, TSC2, VHL.      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     03/01/2023 Cancer Staging   Staging form: Breast, AJCC 8th Edition - Pathologic: Stage IA (pT1c, pN0, cM0, G3, ER+, PR+, HER2-) - Signed by Serena Croissant, MD on 03/01/2023 Histologic grading system: 3 grade system   03/03/2023 Oncotype testing   Oncotype DX recurrence score 36: Distant recurrence at 9 years: 24%   03/16/2023 -  Chemotherapy   Patient is on Treatment Plan : BREAST TC q21d       CURRENT THERAPY: Taxotere/Cytoxan  INTERVAL HISTORY: Riese Lounsberry 52 y.o. female returns for follow-up after receiving Taxotere and Cytoxan cycle 1 last week.  She is cycle 1 day 8.  She noted some achiness initially after receiving her injection that resolved with Tylenol.  She also has had some itching around where the Tegaderm was holding her port in place last week during her treatment.  She denies any nausea or vomiting.  She denies any mouth ulcerations.  She has some mild fatigue but otherwise is doing well today.   Patient Active Problem List   Diagnosis Date Noted   Port-A-Cath in place 03/23/2023   Genetic testing 02/04/2023   Malignant neoplasm of upper-outer quadrant of left breast in female, estrogen receptor positive (HCC) 01/24/2023   Low vitamin D level 11/19/2021   Incontinence 12/19/2020   Low mean corpuscular volume (MCV) 11/23/2020   Pain and swelling of wrist, left 03/14/2020   Hyperglycemia 11/09/2019   Uterine leiomyoma 11/09/2019   Acute upper respiratory infection 02/12/2019   Increased body mass index 11/02/2018   Obesity 08/02/2014   Heart murmur, systolic 08/01/2014   Encounter for well adult exam with abnormal findings 03/01/2011  Hyperlipidemia 11/20/2009   Iron deficiency anemia 11/20/2009   Allergic rhinitis 11/06/2009   Asthma 11/06/2009    is allergic to peanut-containing drug products, soy allergy, egg-derived products, oxycodone, amoxicillin, amoxicillin-pot clavulanate, and shellfish-derived products.  MEDICAL HISTORY: Past Medical History:   Diagnosis Date   ALLERGIC RHINITIS 11/06/2009   Allergy    Anemia    ANEMIA-IRON DEFICIENCY 11/20/2009   Asthma    Cancer (HCC) 12/2022   left breast IDC   Eczema    Heart murmur    Irreducible umbilical hernia 03/01/2011   Wears glasses     SURGICAL HISTORY: Past Surgical History:  Procedure Laterality Date   BREAST BIOPSY Left 01/14/2023   [place] LT BREAST BX W LOC DEV 1ST LESION IMG BX SPEC US GUIDE 01/14/2023 [facility]   BREAST BIOPSY  02/10/2023   MM LT RADIOACTIVE SEED LOC MAMMO GUIDE 02/10/2023 [facility]AM[facility]   BREAST LUMPECTOMY WITH RADIOACTIVE SEED AND SENTINEL LYMPH NODE BIOPSY Left 02/14/2023   Procedure: LEFT BREAST LUMPECTOMY WITH RADIOACTIVE SEED AND AXILLARY SENTINEL LYMPH NODE BIOPSY;  Surgeon: Emelia Loron, MD;  Location: [facility];  Service: General;  Laterality: Left;   PORTACATH PLACEMENT Right 03/15/2023   Procedure: INSERTION PORT-A-CATH;  Surgeon: Emelia Loron, MD;  Location: [facility] OR;  Service: General;  Laterality: Right;   UMBILICAL HERNIA REPAIR  05/13/11    SOCIAL HISTORY: Social History   Socioeconomic History   Marital status: Single    Spouse name: Not on file   Number of children: 0   Years of education: Not on file   Highest education level: Not on file  Occupational History   Occupation: Editor, commissioning    Comment: calculus  Tobacco Use   Smoking status: Never   Smokeless tobacco: Never  Vaping Use   Vaping Use: Never used  Substance and Sexual Activity   Alcohol use: No   Drug use: No   Sexual activity: Not Currently    Birth control/protection: None  Other Topics Concern   Not on file  Social History Narrative   Not on file   Social Determinants of Health   Financial Resource Strain: Not on file  Food Insecurity: Not on file  Transportation Needs: Not on file  Physical Activity: Not on file  Stress: Not on file  Social Connections: Not on file  Intimate Partner Violence: Not on file     FAMILY HISTORY: Family History  Problem Relation Age of Onset   Hyperlipidemia Mother    Diabetes Father    Hypertension Father    Colon polyps Sister    Esophageal cancer Maternal Uncle 2   Pancreatic cancer Cousin 77       mat female cousin   Prostate cancer Cousin 72       mat cousin   Breast cancer Neg Hx    Colon cancer Neg Hx    Stomach cancer Neg Hx    Rectal cancer Neg Hx     Review of Systems  Constitutional:  Positive for fatigue. Negative for appetite change, chills, fever and unexpected weight change.  HENT:   Negative for hearing loss, lump/mass and trouble swallowing.   Eyes:  Negative for eye problems and icterus.  Respiratory:  Negative for chest tightness, cough and shortness of breath.   Cardiovascular:  Negative for chest pain, leg swelling and palpitations.  Gastrointestinal:  Negative for abdominal distention, abdominal pain, constipation, diarrhea, nausea and vomiting.  Endocrine: Negative for hot flashes.  Genitourinary:  Negative  for difficulty urinating.   Musculoskeletal:  Negative for arthralgias.  Skin:  Negative for itching and rash.  Neurological:  Negative for dizziness, extremity weakness, headaches and numbness.  Hematological:  Negative for adenopathy. Does not bruise/bleed easily.  Psychiatric/Behavioral:  Negative for depression. The patient is not nervous/anxious.       PHYSICAL EXAMINATION   Onc Performance Status - 03/23/23 1121       ECOG Perf Status   ECOG Perf Status Fully active, able to carry on all pre-disease performance without restriction      KPS SCALE   KPS % SCORE Normal, no compliants, no evidence of disease             Vitals:   03/23/23 1121  BP: (!) 142/58  Pulse: 95  Resp: 18  Temp: 98.1 F (36.7 C)  SpO2: 100%    Physical Exam Constitutional:      General: She is not in acute distress.    Appearance: Normal appearance. She is not toxic-appearing.  HENT:     Head: Normocephalic and  atraumatic.     Mouth/Throat:     Mouth: Mucous membranes are moist.     Pharynx: Oropharynx is clear. No oropharyngeal exudate or posterior oropharyngeal erythema.  Eyes:     General: No scleral icterus. Cardiovascular:     Rate and Rhythm: Normal rate and regular rhythm.     Pulses: Normal pulses.     Heart sounds: Normal heart sounds.  Pulmonary:     Effort: Pulmonary effort is normal.     Breath sounds: Normal breath sounds.  Abdominal:     General: Abdomen is flat. Bowel sounds are normal. There is no distension.     Palpations: Abdomen is soft.     Tenderness: There is no abdominal tenderness.  Musculoskeletal:        General: No swelling.     Cervical back: Neck supple.  Lymphadenopathy:     Cervical: No cervical adenopathy.  Skin:    General: Skin is warm and dry.     Findings: No rash.  Neurological:     General: No focal deficit present.     Mental Status: She is alert.  Psychiatric:        Mood and Affect: Mood normal.        Behavior: Behavior normal.     LABORATORY DATA:  CBC    Component Value Date/Time   WBC 0.8 (LL) 03/23/2023 1048   WBC 4.7 03/15/2023 1259   RBC 5.27 (H) 03/23/2023 1048   HGB 11.5 (L) 03/23/2023 1048   HCT 35.8 (L) 03/23/2023 1048   PLT 305 03/23/2023 1048   MCV 67.9 (L) 03/23/2023 1048   MCH 21.8 (L) 03/23/2023 1048   MCHC 32.1 03/23/2023 1048   RDW 14.7 03/23/2023 1048   LYMPHSABS PENDING 03/23/2023 1048   MONOABS PENDING 03/23/2023 1048   EOSABS PENDING 03/23/2023 1048   BASOSABS PENDING 03/23/2023 1048    CMP     Component Value Date/Time   NA 136 03/23/2023 1048   K 3.8 03/23/2023 1048   CL 102 03/23/2023 1048   CO2 27 03/23/2023 1048   GLUCOSE 108 (H) 03/23/2023 1048   BUN 8 03/23/2023 1048   CREATININE 0.68 03/23/2023 1048   CALCIUM 8.6 (L) 03/23/2023 1048   PROT 7.6 03/23/2023 1048   ALBUMIN 4.0 03/23/2023 1048   AST 21 03/23/2023 1048   ALT 23 03/23/2023 1048   ALKPHOS 121 03/23/2023 1048   BILITOT 0.3  03/23/2023 1048   GFRNONAA >60 03/23/2023 1048         ASSESSMENT and THERAPY PLAN:   Malignant neoplasm of upper-outer quadrant of left breast in female, estrogen receptor positive (HCC) 02/14/2023: Left lumpectomy: 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 Oncotype DX recurrence score: 36 (distant recurrence at 9 years: 24%)  Recommendation: Adjuvant chemotherapy with Taxotere and Cytoxan every 3 weeks x 4 Adjuvant radiation therapy Adjuvant antiestrogen therapy  Current Treatment: Taxotere/Cytoxan cycle 1 day 8  Chemo toxicities:  Fatigue: Managing with energy conservation Bone pain: Secondary to growth factor injection resolved with Tylenol Chemotherapy-induced neutropenia: Discussed neutropenic precautions in detail.  Itching from Tegaderm: Messaging placed to ensure sensitive skin dressings are used over her port moving forward.  She will return in 2 weeks for labs, follow-up, and her next infusion.    All questions were answered. The patient knows to call the clinic with any problems, questions or concerns. We can certainly see the patient much sooner if necessary.  Total encounter time:30 minutes*in face-to-face visit time, chart review, lab review, care coordination, order entry, and documentation of the encounter time.    Lillard Anes, NP 03/23/23 11:47 AM Medical Oncology and Hematology [facility] [address] [place], [place] [address] Tel. [phone]    Fax. [phone]  *Total Encounter Time as defined by the Centers for Medicare and Medicaid Services includes, in addition to the face-to-face time of a patient visit (documented in the note above) non-face-to-face time: obtaining and reviewing outside history, ordering and reviewing medications, tests or procedures, care coordination (communications with other health care professionals or caregivers) and  documentation in the medical record.

## 2023-03-23 NOTE — ED Triage Notes (Signed)
Pt arrives c/o fever/chills today as well as L ear pain that she noticed around 1700 today. Hx of breast CA and on chemo at this time. WBCs have been low and was told to monitor for fevers. Temp at home was 100.4, then 100.7. Called the doctor and was instructed to come to the ED for evaluation.

## 2023-03-24 ENCOUNTER — Telehealth: Payer: Self-pay

## 2023-03-24 ENCOUNTER — Emergency Department (HOSPITAL_COMMUNITY): Payer: BC Managed Care – PPO

## 2023-03-24 LAB — URINALYSIS, W/ REFLEX TO CULTURE (INFECTION SUSPECTED)
Bilirubin Urine: NEGATIVE
Glucose, UA: NEGATIVE mg/dL
Hgb urine dipstick: NEGATIVE
Ketones, ur: NEGATIVE mg/dL
Leukocytes,Ua: NEGATIVE
Nitrite: NEGATIVE
Protein, ur: NEGATIVE mg/dL
Specific Gravity, Urine: 1.004 — ABNORMAL LOW (ref 1.005–1.030)
pH: 6 (ref 5.0–8.0)

## 2023-03-24 LAB — COMPREHENSIVE METABOLIC PANEL
ALT: 33 U/L (ref 0–44)
AST: 20 U/L (ref 15–41)
Albumin: 3.8 g/dL (ref 3.5–5.0)
Alkaline Phosphatase: 120 U/L (ref 38–126)
Anion gap: 9 (ref 5–15)
BUN: 10 mg/dL (ref 6–20)
CO2: 24 mmol/L (ref 22–32)
Calcium: 8.7 mg/dL — ABNORMAL LOW (ref 8.9–10.3)
Chloride: 101 mmol/L (ref 98–111)
Creatinine, Ser: 0.75 mg/dL (ref 0.44–1.00)
GFR, Estimated: >60 mL/min (ref >60)
Glucose, Bld: 121 mg/dL — ABNORMAL HIGH (ref 70–99)
Potassium: 3.4 mmol/L — ABNORMAL LOW (ref 3.5–5.1)
Sodium: 134 mmol/L — ABNORMAL LOW (ref 135–145)
Total Bilirubin: 0.4 mg/dL (ref 0.3–1.2)
Total Protein: 8.2 g/dL — ABNORMAL HIGH (ref 6.5–8.1)

## 2023-03-24 LAB — CBC WITH DIFFERENTIAL/PLATELET
Abs Immature Granulocytes: 0 10*3/uL (ref 0.00–0.07)
Basophils Absolute: 0 10*3/uL (ref 0.0–0.1)
Basophils Relative: 2 %
Eosinophils Absolute: 0 10*3/uL (ref 0.0–0.5)
Eosinophils Relative: 0 %
HCT: 36.1 % (ref 36.0–46.0)
Hemoglobin: 11.3 g/dL — ABNORMAL LOW (ref 12.0–15.0)
Immature Granulocytes: 0 %
Lymphocytes Relative: 85 %
Lymphs Abs: 0.9 10*3/uL (ref 0.7–4.0)
MCH: 21.7 pg — ABNORMAL LOW (ref 26.0–34.0)
MCHC: 31.3 g/dL (ref 30.0–36.0)
MCV: 69.3 fL — ABNORMAL LOW (ref 80.0–100.0)
Monocytes Absolute: 0.1 10*3/uL (ref 0.1–1.0)
Monocytes Relative: 10 %
Neutro Abs: 0 10*3/uL — CL (ref 1.7–7.7)
Neutrophils Relative %: 3 %
Platelets: 339 10*3/uL (ref 150–400)
RBC: 5.21 MIL/uL — ABNORMAL HIGH (ref 3.87–5.11)
RDW: 14.7 % (ref 11.5–15.5)
WBC: 1 10*3/uL — CL (ref 4.0–10.5)
nRBC: 0 % (ref 0.0–0.2)

## 2023-03-24 LAB — SARS CORONAVIRUS 2 BY RT PCR: SARS Coronavirus 2 by RT PCR: NEGATIVE

## 2023-03-24 MED ORDER — SODIUM CHLORIDE 0.9 % IV SOLN
2.0000 g | Freq: Once | INTRAVENOUS | Status: AC
  Administered 2023-03-24: 2 g via INTRAVENOUS
  Filled 2023-03-24: qty 12.5

## 2023-03-24 MED ORDER — CIPROFLOXACIN HCL 500 MG PO TABS: 500.0000 mg | ORAL_TABLET | Freq: Two times a day (BID) | ORAL | 0 refills | Status: AC

## 2023-03-24 NOTE — Telephone Encounter (Signed)
Pt called regarding visit to ER for neutropenic fever during the night. Pt reports nasal congestion but denies pain, SOB, or dehydration. Pt reports feeling well over all. Reiterated neutropenic precautions. Per MD, telephone appt with NP made to discuss blood culture results and next steps. Appt made, Pt verbalized understanding.

## 2023-03-24 NOTE — Discharge Instructions (Addendum)
She can start taking her new dose of ciprofloxacin this evening. Please call Dr. Earmon [place] office to schedule follow-up appointment.

## 2023-03-25 ENCOUNTER — Encounter: Payer: Self-pay | Admitting: Hematology and Oncology

## 2023-03-25 LAB — CULTURE, BLOOD (ROUTINE X 2): Special Requests: ADEQUATE

## 2023-03-25 NOTE — ED Provider Notes (Signed)
Attestation:   I provided a substantive portion of the care of this patient. I personally provided more than half of the total time dedicated to treatment of this patient. I personally made/approved the management plan for this patient and take responsibility for the patient management.    52 year old female with a history of breast cancer currently undergoing chemotherapy who presents to the emergency department for fever with a Tmax of 100.7.  Patient was seen at oncology clinic earlier in the day and had labs noting neutropenia.  Patient denied any infectious symptoms but did develop rhinorrhea and intermittent left earache after speaking with on-call nurse for oncology.  Repeated labs confirmed neutropenia.  No obvious source of infection.  Blood cultures drawn.  I spoke with Dr. Myna Hidalgo myself who recommended patient receive a dose of cefepime in the emergency department and prescribe ciprofloxacin.  Given the fact that she is well-appearing and nontoxic, we feel that patient is appropriate for discharge with close follow-up in the clinic.  Vitals:   03/24/23 0330 03/24/23 0347  BP: 131/63   Pulse: 83   Resp: 17   Temp:  98.6 F (37 C)  SpO2: 99%        EKG Interpretation  Date/Time:    Ventricular Rate:    PR Interval:    QRS Duration:   QT Interval:    QTC Calculation:   R Axis:     Text Interpretation:           .Critical Care  Performed by: Nira Conn, MD Authorized by: Nira Conn, MD   Critical care provider statement:    Critical care time (minutes):  45   Critical care time was exclusive of:  Separately billable procedures and treating other patients   Critical care was necessary to treat or prevent imminent or life-threatening deterioration of the following conditions: neutropenic fever.   Critical care was time spent personally by me on the following activities:  Development of treatment plan with patient or surrogate, discussions with  consultants, evaluation of patient's response to treatment, examination of patient, obtaining history from patient or surrogate, review of old charts, re-evaluation of patient's condition, pulse oximetry, ordering and review of radiographic studies, ordering and review of laboratory studies and ordering and performing treatments and interventions  .       Nira Conn, MD 03/25/23 [phone]

## 2023-03-26 LAB — CULTURE, BLOOD (ROUTINE X 2)

## 2023-03-28 LAB — CULTURE, BLOOD (ROUTINE X 2)

## 2023-03-29 ENCOUNTER — Encounter: Payer: Self-pay | Admitting: Adult Health

## 2023-03-29 ENCOUNTER — Inpatient Hospital Stay: Payer: BC Managed Care – PPO | Attending: Hematology and Oncology | Admitting: Adult Health

## 2023-03-29 DIAGNOSIS — Z170 Estrogen receptor positive status [ER+]: Secondary | ICD-10-CM | POA: Insufficient documentation

## 2023-03-29 DIAGNOSIS — Z5111 Encounter for antineoplastic chemotherapy: Secondary | ICD-10-CM | POA: Insufficient documentation

## 2023-03-29 DIAGNOSIS — C50412 Malignant neoplasm of upper-outer quadrant of left female breast: Secondary | ICD-10-CM | POA: Diagnosis not present

## 2023-03-29 DIAGNOSIS — Z5189 Encounter for other specified aftercare: Secondary | ICD-10-CM | POA: Insufficient documentation

## 2023-03-29 LAB — CULTURE, BLOOD (ROUTINE X 2)
Culture: NO GROWTH
Culture: NO GROWTH
Special Requests: ADEQUATE

## 2023-03-29 NOTE — Progress Notes (Signed)
[facility] Cancer Follow up:    [name], MD [address] [place] [address]   DIAGNOSIS:  Cancer Staging  Malignant neoplasm of upper-outer quadrant of left breast in female, estrogen receptor positive (HCC) Staging form: Breast, AJCC 8th Edition - Clinical: Stage IA (cT1c, cN0, cM0, G3, ER+, PR+, HER2-) - Signed by Serena Croissant, MD on 01/26/2023 Histologic grading system: 3 grade system - Pathologic: Stage IA (pT1c, pN0, cM0, G3, ER+, PR+, HER2-) - Signed by Serena Croissant, MD on 03/01/2023 Histologic grading system: 3 grade system  I connected with Rayetta Pigg on 03/29/23 at  8:30 AM EDT by telephone and verified that I am speaking with the correct person using two identifiers.  I discussed the limitations, risks, security and privacy concerns of performing an evaluation and management service by telephone and the availability of in person appointments.  I also discussed with the patient that there may be a patient responsible charge related to this service. The patient expressed understanding and agreed to proceed.   Patient location: home Provider location: [facility] office  SUMMARY OF ONCOLOGIC HISTORY: Oncology History  Malignant neoplasm of upper-outer quadrant of left breast in female, estrogen receptor positive (HCC)  01/14/2023 Initial Diagnosis   Screening mammogram detected left breast mass UOQ 12:30 position: 1.1 cm, ultrasound biopsy revealed poorly differentiated IDC grade 3, ER 100%, PR 3%, Ki-67 70%, HER2 negative, axillary lymph node biopsy: Benign concordant   01/26/2023 Cancer Staging   Staging form: Breast, AJCC 8th Edition - Clinical: Stage IA (cT1c, cN0, cM0, G3, ER+, PR+, HER2-) - Signed by Serena Croissant, MD on 01/26/2023 Histologic grading system: 3 grade system   02/03/2023 Genetic Testing   Negative [facility] CustomNext-Cancer +RNAinsight Panel.  Report date is 02/10/2023.   The CustomNext-Cancer+RNAinsight panel offered by [facility]  includes sequencing, rearrangement, and RNA analysis for the following 47 genes:  APC, ATM, AXIN2, BAP1, BARD1, BMPR1A, BRCA1, BRCA2, BRIP1, CDH1, CDK4, CDKN2A, CHEK2, CTNNA1, DICER1, EPCAM, FH, GREM1, HOXB13, KIT, MBD4, MEN1, MLH1, MSH2, MSH3, MSH6, MUTYH, NF1, NTHL1, PALB2, PDGFRA, PMS2, POLD1, POLE, PTEN, RAD51C, RAD51D, SDHA, SDHB, SDHC, SDHD, SMAD4, SMARCA4, STK11, TP53, TSC1, TSC2, VHL.      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    03/01/2023 Cancer Staging   Staging form: Breast, AJCC 8th Edition - Pathologic: Stage IA (pT1c, pN0, cM0, G3, ER+, PR+, HER2-) - Signed by Serena Croissant, MD on 03/01/2023 Histologic grading system: 3 grade system   03/03/2023 Oncotype testing   Oncotype DX recurrence score 36: Distant recurrence at 9 years: 24%   03/16/2023 -  Chemotherapy   Patient is on Treatment Plan : BREAST TC q21d       CURRENT THERAPY: Taxotere/Cytoxan  INTERVAL HISTORY: Analleli Coven 51 y.o. female returns for f/u after ER visit.  She received her first cycle of adjuvant Taxotere and Cytoxan on 03/16/2023.  She came to the ED on 03/23/2023 for febrile neutropenia presenting with Tmax of 100.7. labs demonstrated an ANC of 0.She received IV Cefepime x 1 and was given Cipro PO BID after blood culture, urinalysis and urine culture, along with chest xray were obtained and she was discharged from the ER.   She is feeling better since being in the ER.  Her energy level is improving.  She has not had any further fevers.   She was  not under the impression she would lose her hair and wants to know what to expect since she has noted hair loss.     Patient Active Problem List   Diagnosis Date Noted   Port-A-Cath in place 03/23/2023   Genetic testing 02/04/2023   Malignant neoplasm of upper-outer quadrant of left breast in female, estrogen receptor positive (HCC) 01/24/2023   Low  vitamin D level 11/19/2021   Incontinence 12/19/2020   Low mean corpuscular volume (MCV) 11/23/2020   Pain and swelling of wrist, left 03/14/2020   Hyperglycemia 11/09/2019   Uterine leiomyoma 11/09/2019   Acute upper respiratory infection 02/12/2019   Increased body mass index 11/02/2018   Obesity 08/02/2014   Heart murmur, systolic 08/01/2014   Encounter for well adult exam with abnormal findings 03/01/2011   Hyperlipidemia 11/20/2009   Iron deficiency anemia 11/20/2009   Allergic rhinitis 11/06/2009   Asthma 11/06/2009    is allergic to peanut-containing drug products, soy allergy, egg-derived products, oxycodone, amoxicillin, amoxicillin-pot clavulanate, and shellfish-derived products.  MEDICAL HISTORY: Past Medical History:  Diagnosis Date   ALLERGIC RHINITIS 11/06/2009   Allergy    Anemia    ANEMIA-IRON DEFICIENCY 11/20/2009   Asthma    Cancer (HCC) 12/2022   left breast IDC   Eczema    Heart murmur    Irreducible umbilical hernia 03/01/2011   Wears glasses     SURGICAL HISTORY: Past Surgical History:  Procedure Laterality Date   BREAST BIOPSY Left 01/14/2023   [place] LT BREAST BX W LOC DEV 1ST LESION IMG BX SPEC US GUIDE 01/14/2023 [facility]AMMOGRAP[facility]   BREAST BIOPSY  02/10/2023   MM LT RADIOACTIVE SEED LOC MAMMO GUIDE 02/10/2023 [facility] MAMMOGR[facility]   BREAST LUMPECTOMY WITH RADIOACTIVE SEED AND SENTINEL LYMPH NODE BIOPSY Left 02/14/2023   Procedure: LEFT BREAST LUMPECTOMY WITH RADIOACTIVE SEED AND AXILLARY SENTINEL LYMPH NODE BIOPSY;  Surgeon: Emelia Loron, MD;  Location: [facility];  Service: General;  Laterality: Left;   PORTACATH PLACEMENT Right 03/15/2023   Procedure: INSERTION PORT-A-CATH;  Surgeon: Emelia Loron, MD;  Location: [facility] OR;  Service: General;  Laterality: Right;   UMBILICAL HERNIA REPAIR  05/13/11    SOCIAL HISTORY: Social History   Socioeconomic History   Marital status: Single    Spouse name: Not on file   Number of  children: 0   Years of education: Not on file   Highest education level: Not on file  Occupational History   Occupation: Editor, commissioning    Comment: calculus  Tobacco Use   Smoking status: Never   Smokeless tobacco: Never  Vaping Use   Vaping Use: Never used  Substance and Sexual Activity   Alcohol use: No   Drug use: No   Sexual activity: Not Currently    Birth control/protection: None  Other Topics Concern   Not on file  Social History Narrative   Not on file   Social Determinants of Health   Financial Resource Strain: Not on file  Food Insecurity: Not on file  Transportation Needs: Not on file  Physical Activity: Not on file  Stress: Not on file  Social Connections: Not on file  Intimate Partner Violence: Not on file    FAMILY HISTORY: Family History  Problem Relation Age of Onset   Hyperlipidemia Mother    Diabetes Father    Hypertension Father    Colon polyps Sister    Esophageal cancer Maternal Uncle 74   Pancreatic cancer Cousin 36  mat female cousin   Prostate cancer Cousin 31       mat cousin   Breast cancer Neg Hx    Colon cancer Neg Hx    Stomach cancer Neg Hx    Rectal cancer Neg Hx     Review of Systems  Constitutional:  Positive for fatigue. Negative for appetite change, chills, fever and unexpected weight change.  HENT:   Negative for hearing loss, lump/mass and trouble swallowing.   Eyes:  Negative for eye problems and icterus.  Respiratory:  Negative for chest tightness, cough and shortness of breath.   Cardiovascular:  Negative for chest pain, leg swelling and palpitations.  Gastrointestinal:  Negative for abdominal distention, abdominal pain, constipation, diarrhea, nausea and vomiting.  Endocrine: Negative for hot flashes.  Genitourinary:  Negative for difficulty urinating.   Musculoskeletal:  Negative for arthralgias.  Skin:  Negative for itching and rash.  Neurological:  Negative for dizziness, extremity weakness, headaches and  numbness.  Hematological:  Negative for adenopathy. Does not bruise/bleed easily.  Psychiatric/Behavioral:  Negative for depression. The patient is not nervous/anxious.       PHYSICAL EXAMINATION  Patient sounds well.  She is in no apparent distress.  Mood and behavior are normal.  Speech is normal.  Breathing sounds non-labored      LABORATORY DATA:  CBC    Component Value Date/Time   WBC 1.0 (LL) 03/24/2023 0030   RBC 5.21 (H) 03/24/2023 0030   HGB 11.3 (L) 03/24/2023 0030   HGB 11.5 (L) 03/23/2023 1048   HCT 36.1 03/24/2023 0030   PLT 339 03/24/2023 0030   PLT 305 03/23/2023 1048   MCV 69.3 (L) 03/24/2023 0030   MCH 21.7 (L) 03/24/2023 0030   MCHC 31.3 03/24/2023 0030   RDW 14.7 03/24/2023 0030   LYMPHSABS 0.9 03/24/2023 0030   MONOABS 0.1 03/24/2023 0030   EOSABS 0.0 03/24/2023 0030   BASOSABS 0.0 03/24/2023 0030    CMP     Component Value Date/Time   NA 134 (L) 03/24/2023 0030   K 3.4 (L) 03/24/2023 0030   CL 101 03/24/2023 0030   CO2 24 03/24/2023 0030   GLUCOSE 121 (H) 03/24/2023 0030   BUN 10 03/24/2023 0030   CREATININE 0.75 03/24/2023 0030   CREATININE 0.68 03/23/2023 1048   CALCIUM 8.7 (L) 03/24/2023 0030   PROT 8.2 (H) 03/24/2023 0030   ALBUMIN 3.8 03/24/2023 0030   AST 20 03/24/2023 0030   AST 21 03/23/2023 1048   ALT 33 03/24/2023 0030   ALT 23 03/23/2023 1048   ALKPHOS 120 03/24/2023 0030   BILITOT 0.4 03/24/2023 0030   BILITOT 0.3 03/23/2023 1048   GFRNONAA >60 03/24/2023 0030   GFRNONAA >60 03/23/2023 1048         ASSESSMENT and THERAPY PLAN:   Malignant neoplasm of upper-outer quadrant of left breast in female, estrogen receptor positive (HCC) 02/14/2023: Left lumpectomy: 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 Oncotype DX recurrence score: 36 (distant recurrence at 9 years: 24%)  Recommendation: Adjuvant chemotherapy with Taxotere and Cytoxan every 3  weeks x 4 Adjuvant radiation therapy Adjuvant antiestrogen therapy  Current Treatment: Taxotere/Cytoxan cycle 1 day 15  Chemo toxicities:  Fatigue: Managing with energy conservation Bone pain: Secondary to growth factor injection resolved with Tylenol Neutropenic fever: resolved.  S/p IV cefepime x 1 and cipro BID which she tolerated well. I sent details to Dr. Pamelia Hoit  so he can decide whether he wants to dose reduce subsequent cycles of treatment. Alopecia: I counseled her on cutting her hair shorter as her hair will come out in a couple of weeks.   She will return in 1 week for labs, follow-up, and her next infusion.  Follow up instructions:    -Return to cancer center 04/05/2023 for labs/f/u and her next treatment.     The patient was provided an opportunity to ask questions and all were answered. The patient agreed with the plan and demonstrated an understanding of the instructions.   The patient was advised to call back or seek an in-person evaluation if the symptoms worsen or if the condition fails to improve as anticipated.   I provided 25 minutes of non face-to-face telephone visit time during this encounter, and > 50% was spent counseling as documented under my assessment & plan.  Lillard Anes, NP 03/29/23 9:03 AM Medical Oncology and Hematology [facility] [address] [place], [place] [address] Tel. [phone]    Fax. [phone]  ADDENDUM: Reviewed febrile neutropenia with Dr. Pamelia Hoit and dose reduced to Cytoxan 400mg /m2 and Taxotere 50mg /m2 per his recommendation.

## 2023-03-29 NOTE — Assessment & Plan Note (Addendum)
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 Oncotype DX recurrence score: 36 (distant recurrence at 9 years: 24%)  Recommendation: Adjuvant chemotherapy with Taxotere and Cytoxan every 3 weeks x 4 Adjuvant radiation therapy Adjuvant antiestrogen therapy  Current Treatment: Taxotere/Cytoxan cycle 1 day 15  Chemo toxicities:  Fatigue: Managing with energy conservation Bone pain: Secondary to growth factor injection resolved with Tylenol Neutropenic fever: resolved.  S/p IV cefepime x 1 and cipro BID which she tolerated well. I sent details to Dr. Pamelia Hoit so he can decide whether he wants to dose reduce subsequent cycles of treatment. Alopecia: I counseled her on cutting her hair shorter as her hair will come out in a couple of weeks.   She will return in 1 week for labs, follow-up, and her next infusion.

## 2023-03-30 ENCOUNTER — Encounter: Payer: Self-pay | Admitting: Hematology and Oncology

## 2023-03-30 NOTE — Addendum Note (Signed)
Addended by: Noreene Filbert on: 03/30/2023 08:38 AM   Modules accepted: Orders

## 2023-04-04 MED FILL — Fosaprepitant Dimeglumine For IV Infusion 150 MG (Base Eq): INTRAVENOUS | Qty: 5 | Status: AC

## 2023-04-04 MED FILL — Dexamethasone Sodium Phosphate Inj 100 MG/10ML: INTRAMUSCULAR | Qty: 1 | Status: AC

## 2023-04-05 ENCOUNTER — Encounter: Payer: Self-pay | Admitting: General Practice

## 2023-04-05 ENCOUNTER — Other Ambulatory Visit: Payer: Self-pay

## 2023-04-05 ENCOUNTER — Encounter: Payer: Self-pay | Admitting: Adult Health

## 2023-04-05 ENCOUNTER — Inpatient Hospital Stay: Payer: BC Managed Care – PPO

## 2023-04-05 ENCOUNTER — Inpatient Hospital Stay (HOSPITAL_BASED_OUTPATIENT_CLINIC_OR_DEPARTMENT_OTHER): Payer: BC Managed Care – PPO | Admitting: Adult Health

## 2023-04-05 DIAGNOSIS — Z170 Estrogen receptor positive status [ER+]: Secondary | ICD-10-CM

## 2023-04-05 DIAGNOSIS — C50412 Malignant neoplasm of upper-outer quadrant of left female breast: Secondary | ICD-10-CM

## 2023-04-05 DIAGNOSIS — Z5111 Encounter for antineoplastic chemotherapy: Secondary | ICD-10-CM | POA: Diagnosis present

## 2023-04-05 DIAGNOSIS — Z5189 Encounter for other specified aftercare: Secondary | ICD-10-CM | POA: Diagnosis not present

## 2023-04-05 DIAGNOSIS — Z95828 Presence of other vascular implants and grafts: Secondary | ICD-10-CM

## 2023-04-05 LAB — CBC WITH DIFFERENTIAL (CANCER CENTER ONLY)
Abs Immature Granulocytes: 0.12 10*3/uL — ABNORMAL HIGH (ref 0.00–0.07)
Basophils Absolute: 0.1 10*3/uL (ref 0.0–0.1)
Basophils Relative: 1 %
Eosinophils Absolute: 0.1 10*3/uL (ref 0.0–0.5)
Eosinophils Relative: 1 %
HCT: 33.6 % — ABNORMAL LOW (ref 36.0–46.0)
Hemoglobin: 10.7 g/dL — ABNORMAL LOW (ref 12.0–15.0)
Immature Granulocytes: 1 %
Lymphocytes Relative: 19 %
Lymphs Abs: 2.4 10*3/uL (ref 0.7–4.0)
MCH: 21.9 pg — ABNORMAL LOW (ref 26.0–34.0)
MCHC: 31.8 g/dL (ref 30.0–36.0)
MCV: 68.7 fL — ABNORMAL LOW (ref 80.0–100.0)
Monocytes Absolute: 1 10*3/uL (ref 0.1–1.0)
Monocytes Relative: 8 %
Neutro Abs: 8.8 10*3/uL — ABNORMAL HIGH (ref 1.7–7.7)
Neutrophils Relative %: 70 %
Platelet Count: 346 10*3/uL (ref 150–400)
RBC: 4.89 MIL/uL (ref 3.87–5.11)
RDW: 15.9 % — ABNORMAL HIGH (ref 11.5–15.5)
WBC Count: 12.5 10*3/uL — ABNORMAL HIGH (ref 4.0–10.5)
nRBC: 0 % (ref 0.0–0.2)

## 2023-04-05 LAB — CMP (CANCER CENTER ONLY)
ALT: 56 U/L — ABNORMAL HIGH (ref 0–44)
AST: 51 U/L — ABNORMAL HIGH (ref 15–41)
Albumin: 3.9 g/dL (ref 3.5–5.0)
Alkaline Phosphatase: 97 U/L (ref 38–126)
Anion gap: 8 (ref 5–15)
BUN: 11 mg/dL (ref 6–20)
CO2: 25 mmol/L (ref 22–32)
Calcium: 8.7 mg/dL — ABNORMAL LOW (ref 8.9–10.3)
Chloride: 106 mmol/L (ref 98–111)
Creatinine: 0.75 mg/dL (ref 0.44–1.00)
GFR, Estimated: >60 mL/min (ref >60)
Glucose, Bld: 118 mg/dL — ABNORMAL HIGH (ref 70–99)
Potassium: 3.3 mmol/L — ABNORMAL LOW (ref 3.5–5.1)
Sodium: 139 mmol/L (ref 135–145)
Total Bilirubin: 0.2 mg/dL — ABNORMAL LOW (ref 0.3–1.2)
Total Protein: 7.3 g/dL (ref 6.5–8.1)

## 2023-04-05 LAB — PREGNANCY, URINE: Preg Test, Ur: NEGATIVE

## 2023-04-05 MED ORDER — SODIUM CHLORIDE 0.9 % IV SOLN
150.0000 mg | Freq: Once | INTRAVENOUS | Status: AC
  Administered 2023-04-05: 150 mg via INTRAVENOUS
  Filled 2023-04-05: qty 150

## 2023-04-05 MED ORDER — SODIUM CHLORIDE 0.9 % IV SOLN
400.0000 mg/m2 | Freq: Once | INTRAVENOUS | Status: AC
  Administered 2023-04-05: 800 mg via INTRAVENOUS
  Filled 2023-04-05: qty 40

## 2023-04-05 MED ORDER — HEPARIN SOD (PORK) LOCK FLUSH 100 UNIT/ML IV SOLN
500.0000 [IU] | Freq: Once | INTRAVENOUS | Status: AC | PRN
  Administered 2023-04-05: 500 [IU]

## 2023-04-05 MED ORDER — SODIUM CHLORIDE 0.9 % IV SOLN
10.0000 mg | Freq: Once | INTRAVENOUS | Status: AC
  Administered 2023-04-05: 10 mg via INTRAVENOUS
  Filled 2023-04-05: qty 10

## 2023-04-05 MED ORDER — SODIUM CHLORIDE 0.9 % IV SOLN
50.0000 mg/m2 | Freq: Once | INTRAVENOUS | Status: AC
  Administered 2023-04-05: 100 mg via INTRAVENOUS
  Filled 2023-04-05: qty 10

## 2023-04-05 MED ORDER — SODIUM CHLORIDE 0.9% FLUSH
10.0000 mL | INTRAVENOUS | Status: DC | PRN
  Administered 2023-04-05: 10 mL

## 2023-04-05 MED ORDER — PALONOSETRON HCL INJECTION 0.25 MG/5ML
0.2500 mg | Freq: Once | INTRAVENOUS | Status: AC
  Administered 2023-04-05: 0.25 mg via INTRAVENOUS
  Filled 2023-04-05: qty 5

## 2023-04-05 MED ORDER — SODIUM CHLORIDE 0.9 % IV SOLN: Freq: Once | INTRAVENOUS | Status: AC

## 2023-04-05 MED ORDER — SODIUM CHLORIDE 0.9% FLUSH
10.0000 mL | Freq: Once | INTRAVENOUS | Status: AC
  Administered 2023-04-05: 10 mL

## 2023-04-05 NOTE — Progress Notes (Signed)
[facility] Spiritual Care Note  Followed up with Ms Deem in infusion. She reports that she is coping with her hair loss and feeling relieved that at least it's not patchy, but evenly gone. Her overall tone remained upbeat during this encounter, and she reports that she is doing well overall and had only manageable side effects after her last treatment (worst was pain after injection).   Provided pastoral presence, empathic listening, affirmation of strengths, and encouragement. We plan to continue to check in at treatments, and she is aware of ongoing chaplain availability in the meantime, as well.   [address] [place], [place], [facility] Pager [phone] Voicemail [phone]

## 2023-04-05 NOTE — Progress Notes (Signed)
[facility] Cancer Follow up:    Sue Levins, MD [address] [place] [address]   DIAGNOSIS:  Cancer Staging  Malignant neoplasm of upper-outer quadrant of left breast in female, estrogen receptor positive (HCC) Staging form: Breast, AJCC 8th Edition - Clinical: Stage IA (cT1c, cN0, cM0, G3, ER+, PR+, HER2-) - Signed by Sue Croissant, MD on 01/26/2023 Histologic grading system: 3 grade system - Pathologic: Stage IA (pT1c, pN0, cM0, G3, ER+, PR+, HER2-) - Signed by Sue Croissant, MD on 03/01/2023 Histologic grading system: 3 grade system   SUMMARY OF ONCOLOGIC HISTORY: Oncology History  Malignant neoplasm of upper-outer quadrant of left breast in female, estrogen receptor positive (HCC)  01/14/2023 Initial Diagnosis   Screening mammogram detected left breast mass UOQ 12:30 position: 1.1 cm, ultrasound biopsy revealed poorly differentiated IDC grade 3, ER 100%, PR 3%, Ki-67 70%, HER2 negative, axillary lymph node biopsy: Benign concordant   01/26/2023 Cancer Staging   Staging form: Breast, AJCC 8th Edition - Clinical: Stage IA (cT1c, cN0, cM0, G3, ER+, PR+, HER2-) - Signed by Sue Croissant, MD on 01/26/2023 Histologic grading system: 3 grade system   02/03/2023 Genetic Testing   Negative [facility] CustomNext-Cancer +RNAinsight Panel.  Report date is 02/10/2023.   The CustomNext-Cancer+RNAinsight panel offered by [facility] includes sequencing, rearrangement, and RNA analysis for the following 47 genes:  APC, ATM, AXIN2, BAP1, BARD1, BMPR1A, BRCA1, BRCA2, BRIP1, CDH1, CDK4, CDKN2A, CHEK2, CTNNA1, DICER1, EPCAM, FH, GREM1, HOXB13, KIT, MBD4, MEN1, MLH1, MSH2, MSH3, MSH6, MUTYH, NF1, NTHL1, PALB2, PDGFRA, PMS2, POLD1, POLE, PTEN, RAD51C, RAD51D, SDHA, SDHB, SDHC, SDHD, SMAD4, SMARCA4, STK11, TP53, TSC1, TSC2, VHL.      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     03/01/2023 Cancer Staging   Staging form: Breast, AJCC 8th Edition - Pathologic: Stage IA (pT1c, pN0, cM0, G3, ER+, PR+, HER2-) - Signed by Sue Croissant, MD on 03/01/2023 Histologic grading system: 3 grade system   03/03/2023 Oncotype testing   Oncotype DX recurrence score 36: Distant recurrence at 9 years: 24%   03/16/2023 -  Chemotherapy   Patient is on Treatment Plan : BREAST TC q21d       CURRENT THERAPY: Taxotere/Cytoxan  INTERVAL HISTORY: Sue Beasley 52 y.o. female returns for follow-up and evaluation prior to receiving her second cycle of Taxotere and Cytoxan.  This has been dose reduced per Dr. Pamelia Beasley after Sue Beasley nadir check that demonstrated neutropenia and she had febrile episode requiring ER visit with IV antibiotics and PO Cipro.   She is doing well today.  She denies peripheral neuropathy.  She has some darkening at the base of her left first digit nailbed that she wanted to show me otherwise she is doing well today.   Patient Active Problem List   Diagnosis Date Noted   Port-A-Cath in place 03/23/2023   Genetic testing 02/04/2023   Malignant neoplasm of upper-outer quadrant of left breast in female, estrogen receptor positive (HCC) 01/24/2023   Low vitamin D level 11/19/2021   Incontinence 12/19/2020   Low mean corpuscular volume (MCV) 11/23/2020   Pain and swelling of wrist, left 03/14/2020   Hyperglycemia 11/09/2019   Uterine leiomyoma 11/09/2019   Acute upper respiratory infection 02/12/2019   Increased body mass index 11/02/2018   Obesity 08/02/2014   Heart murmur, systolic 08/01/2014   Encounter for well adult exam with  abnormal findings 03/01/2011   Hyperlipidemia 11/20/2009   Iron deficiency anemia 11/20/2009   Allergic rhinitis 11/06/2009   Asthma 11/06/2009    is allergic to peanut-containing drug products, soy allergy, egg-derived products, oxycodone, amoxicillin, amoxicillin-pot clavulanate, and shellfish-derived products.  MEDICAL  HISTORY: Past Medical History:  Diagnosis Date   ALLERGIC RHINITIS 11/06/2009   Allergy    Anemia    ANEMIA-IRON DEFICIENCY 11/20/2009   Asthma    Cancer (HCC) 12/2022   left breast IDC   Eczema    Heart murmur    Irreducible umbilical hernia 03/01/2011   Wears glasses     SURGICAL HISTORY: Past Surgical History:  Procedure Laterality Date   BREAST BIOPSY Left 01/14/2023   [place] LT BREAST BX W LOC DEV 1ST LESION IMG BX SPEC US GUIDE 01/14/2023 [facility]AMMO[facility]   BREAST BIOPSY  02/10/2023   MM LT RADIOACTIVE SEED LOC MAMMO GUIDE 02/10/2023 [facility]AMMOGR[facility]   BREAST LUMPECTOMY WITH RADIOACTIVE SEED AND SENTINEL LYMPH NODE BIOPSY Left 02/14/2023   Procedure: LEFT BREAST LUMPECTOMY WITH RADIOACTIVE SEED AND AXILLARY SENTINEL LYMPH NODE BIOPSY;  Surgeon: Sue Loron, MD;  Location: [facility];  Service: General;  Laterality: Left;   PORTACATH PLACEMENT Right 03/15/2023   Procedure: INSERTION PORT-A-CATH;  Surgeon: Sue Loron, MD;  Location: [facility] OR;  Service: General;  Laterality: Right;   UMBILICAL HERNIA REPAIR  05/13/11    SOCIAL HISTORY: Social History   Socioeconomic History   Marital status: Single    Spouse name: Not on file   Number of children: 0   Years of education: Not on file   Highest education level: Not on file  Occupational History   Occupation: Editor, commissioning    Comment: calculus  Tobacco Use   Smoking status: Never   Smokeless tobacco: Never  Vaping Use   Vaping Use: Never used  Substance and Sexual Activity   Alcohol use: No   Drug use: No   Sexual activity: Not Currently    Birth control/protection: None  Other Topics Concern   Not on file  Social History Narrative   Not on file   Social Determinants of Health   Financial Resource Strain: Not on file  Food Insecurity: Not on file  Transportation Needs: Not on file  Physical Activity: Not on file  Stress: Not on file  Social Connections: Not on file  Intimate Partner  Violence: Not on file    FAMILY HISTORY: Family History  Problem Relation Age of Onset   Hyperlipidemia Mother    Diabetes Father    Hypertension Father    Colon polyps Sister    Esophageal cancer Maternal Uncle 95   Pancreatic cancer Cousin 32       mat female cousin   Prostate cancer Cousin 63       mat cousin   Breast cancer Neg Hx    Colon cancer Neg Hx    Stomach cancer Neg Hx    Rectal cancer Neg Hx     Review of Systems  Constitutional:  Positive for fatigue (Mild). Negative for appetite change, chills, fever and unexpected weight change.  HENT:   Negative for hearing loss, lump/mass and trouble swallowing.   Eyes:  Negative for eye problems and icterus.  Respiratory:  Negative for chest tightness, cough and shortness of breath.   Cardiovascular:  Negative for chest pain, leg swelling and palpitations.  Gastrointestinal:  Negative for abdominal distention, abdominal pain, constipation, diarrhea, nausea and vomiting.  Endocrine: Negative for  hot flashes.  Genitourinary:  Negative for difficulty urinating.   Musculoskeletal:  Negative for arthralgias.  Skin:  Negative for itching and rash.  Neurological:  Negative for dizziness, extremity weakness, headaches and numbness.  Hematological:  Negative for adenopathy. Does not bruise/bleed easily.  Psychiatric/Behavioral:  Negative for depression. The patient is not nervous/anxious.       PHYSICAL EXAMINATION   Onc Performance Status - 04/05/23 1206       ECOG Perf Status   ECOG Perf Status Fully active, able to carry on all pre-disease performance without restriction      KPS SCALE   KPS % SCORE Normal, no compliants, no evidence of disease             Vitals:   04/05/23 1147  BP: (!) 156/61  Pulse: 81  Resp: 19  Temp: 98.8 F (37.1 C)  SpO2: 99%    Physical Exam Constitutional:      General: She is not in acute distress.    Appearance: Normal appearance. She is not toxic-appearing.  HENT:      Head: Normocephalic and atraumatic.     Mouth/Throat:     Mouth: Mucous membranes are moist.     Pharynx: Oropharynx is clear. No oropharyngeal exudate or posterior oropharyngeal erythema.  Eyes:     General: No scleral icterus. Cardiovascular:     Rate and Rhythm: Normal rate and regular rhythm.     Pulses: Normal pulses.     Heart sounds: Normal heart sounds.  Pulmonary:     Effort: Pulmonary effort is normal.     Breath sounds: Normal breath sounds.  Abdominal:     General: Abdomen is flat. Bowel sounds are normal. There is no distension.     Palpations: Abdomen is soft.     Tenderness: There is no abdominal tenderness.  Musculoskeletal:        General: No swelling.     Cervical back: Neck supple.  Lymphadenopathy:     Cervical: No cervical adenopathy.  Skin:    General: Skin is warm and dry.     Findings: No rash.     Comments: Darkening noted at the base of the left hand, first digit nailbed.  Neurological:     General: No focal deficit present.     Mental Status: She is alert.  Psychiatric:        Mood and Affect: Mood normal.        Behavior: Behavior normal.     LABORATORY DATA:  CBC    Component Value Date/Time   WBC 12.5 (H) 04/05/2023 1119   WBC 1.0 (LL) 03/24/2023 0030   RBC 4.89 04/05/2023 1119   HGB 10.7 (L) 04/05/2023 1119   HCT 33.6 (L) 04/05/2023 1119   PLT 346 04/05/2023 1119   MCV 68.7 (L) 04/05/2023 1119   MCH 21.9 (L) 04/05/2023 1119   MCHC 31.8 04/05/2023 1119   RDW 15.9 (H) 04/05/2023 1119   LYMPHSABS 2.4 04/05/2023 1119   MONOABS 1.0 04/05/2023 1119   EOSABS 0.1 04/05/2023 1119   BASOSABS 0.1 04/05/2023 1119    CMP     Component Value Date/Time   NA 134 (L) 03/24/2023 0030   K 3.4 (L) 03/24/2023 0030   CL 101 03/24/2023 0030   CO2 24 03/24/2023 0030   GLUCOSE 121 (H) 03/24/2023 0030   BUN 10 03/24/2023 0030   CREATININE 0.75 03/24/2023 0030   CREATININE 0.68 03/23/2023 1048   CALCIUM 8.7 (L) 03/24/2023 0030  PROT 8.2 (H)  03/24/2023 0030   ALBUMIN 3.8 03/24/2023 0030   AST 20 03/24/2023 0030   AST 21 03/23/2023 1048   ALT 33 03/24/2023 0030   ALT 23 03/23/2023 1048   ALKPHOS 120 03/24/2023 0030   BILITOT 0.4 03/24/2023 0030   BILITOT 0.3 03/23/2023 1048   GFRNONAA >60 03/24/2023 0030   GFRNONAA >60 03/23/2023 1048     ASSESSMENT and THERAPY PLAN:   Malignant neoplasm of upper-outer quadrant of left breast in female, estrogen receptor positive (HCC) 02/14/2023: Left lumpectomy: 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 Oncotype DX recurrence score: 36 (distant recurrence at 9 years: 24%)  Recommendation: Adjuvant chemotherapy with Taxotere and Cytoxan every 3 weeks x 4 Adjuvant radiation therapy Adjuvant antiestrogen therapy  Current Treatment: Taxotere/Cytoxan cycle 2 day 1  Chemo toxicities:  Fatigue: Managing with energy conservation Bone pain: Secondary to growth factor injection resolved with Tylenol Neutropenic fever: resolved.  Subsequent chemotherapy cycles were dose reduced per Dr. Earmon Phoenix recommendation. Alopecia Nail dyscrasia  Her labs were reviewed today.  Her white blood cells increased to 12.5.  She we will proceed with treatment today.  She has no questions or concerns.  All questions were answered. The patient knows to call the clinic with any problems, questions or concerns. We can certainly see the patient much sooner if necessary.  Total encounter time:20 minutes*in face-to-face visit time, chart review, lab review, care coordination, order entry, and documentation of the encounter time.    Lillard Anes, NP 04/05/23 12:19 PM Medical Oncology and Hematology [facility] [address] [place], [place] [address] Tel. [phone]    Fax. [phone]  *Total Encounter Time as defined by the Centers for Medicare and Medicaid Services includes, in addition to the face-to-face time of  a patient visit (documented in the note above) non-face-to-face time: obtaining and reviewing outside history, ordering and reviewing medications, tests or procedures, care coordination (communications with other health care professionals or caregivers) and documentation in the medical record.

## 2023-04-05 NOTE — Patient Instructions (Signed)
[facility]  Discharge Instructions: Thank you for choosing [facility] to provide your oncology and hematology care.   If you have a lab appointment with the Cancer Center, please go directly to the Cancer Center and check in at the registration area.   Wear comfortable clothing and clothing appropriate for easy access to any Portacath or PICC line.   We strive to give you quality time with your provider. You may need to reschedule your appointment if you arrive late (15 or more minutes).  Arriving late affects you and other patients whose appointments are after yours.  Also, if you miss three or more appointments without notifying the office, you may be dismissed from the clinic at the provider's discretion.      For prescription refill requests, have your pharmacy contact our office and allow 72 hours for refills to be completed.    Today you received the following chemotherapy and/or immunotherapy agents: Docetaxel, Cytoxan.       To help prevent nausea and vomiting after your treatment, we encourage you to take your nausea medication as directed.  BELOW ARE SYMPTOMS THAT SHOULD BE REPORTED IMMEDIATELY: *FEVER GREATER THAN 100.4 F (38 C) OR HIGHER *CHILLS OR SWEATING *NAUSEA AND VOMITING THAT IS NOT CONTROLLED WITH YOUR NAUSEA MEDICATION *UNUSUAL SHORTNESS OF BREATH *UNUSUAL BRUISING OR BLEEDING *URINARY PROBLEMS (pain or burning when urinating, or frequent urination) *BOWEL PROBLEMS (unusual diarrhea, constipation, pain near the anus) TENDERNESS IN MOUTH AND THROAT WITH OR WITHOUT PRESENCE OF ULCERS (sore throat, sores in mouth, or a toothache) UNUSUAL RASH, SWELLING OR PAIN  UNUSUAL VAGINAL DISCHARGE OR ITCHING   Items with * indicate a potential emergency and should be followed up as soon as possible or go to the Emergency Department if any problems should occur.  Please show the CHEMOTHERAPY ALERT CARD or IMMUNOTHERAPY ALERT  CARD at check-in to the Emergency Department and triage nurse.  Should you have questions after your visit or need to cancel or reschedule your appointment, please contact [facility]  Dept: [phone]  and follow the prompts.  Office hours are 8:00 a.m. to 4:30 p.m. Monday - Friday. Please note that voicemails left after 4:00 p.m. may not be returned until the following business day.  We are closed weekends and major holidays. You have access to a nurse at all times for urgent questions. Please call the main number to the clinic Dept: [phone] and follow the prompts.   For any non-urgent questions, you may also contact your provider using MyChart. We now offer e-Visits for anyone 18 and older to request care online for non-urgent symptoms. For details visit [email or web address].   Also download the MyChart app! Go to the app store, search "MyChart", open the app, select [facility], and log in with your MyChart username and password.   

## 2023-04-05 NOTE — Assessment & Plan Note (Signed)
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 Oncotype DX recurrence score: 36 (distant recurrence at 9 years: 24%)  Recommendation: Adjuvant chemotherapy with Taxotere and Cytoxan every 3 weeks x 4 Adjuvant radiation therapy Adjuvant antiestrogen therapy  Current Treatment: Taxotere/Cytoxan cycle 2 day 1  Chemo toxicities:  Fatigue: Managing with energy conservation Bone pain: Secondary to growth factor injection resolved with Tylenol Neutropenic fever: resolved.  Subsequent chemotherapy cycles were dose reduced per Dr. Earmon Phoenix recommendation. Alopecia Nail dyscrasia  Her labs were reviewed today.  Her white blood cells increased to 12.5.  She we will proceed with treatment today.  She has no questions or concerns.

## 2023-04-07 ENCOUNTER — Telehealth: Payer: Self-pay | Admitting: *Deleted

## 2023-04-07 ENCOUNTER — Inpatient Hospital Stay: Payer: BC Managed Care – PPO

## 2023-04-07 ENCOUNTER — Other Ambulatory Visit: Payer: Self-pay

## 2023-04-07 VITALS — BP 125/65 | HR 75 | Temp 98.4°F | Resp 20

## 2023-04-07 DIAGNOSIS — Z170 Estrogen receptor positive status [ER+]: Secondary | ICD-10-CM

## 2023-04-07 DIAGNOSIS — Z5111 Encounter for antineoplastic chemotherapy: Secondary | ICD-10-CM | POA: Diagnosis not present

## 2023-04-07 MED ORDER — PEGFILGRASTIM-JMDB 6 MG/0.6ML ~~LOC~~ SOSY
6.0000 mg | PREFILLED_SYRINGE | Freq: Once | SUBCUTANEOUS | Status: AC
  Administered 2023-04-07: 6 mg via SUBCUTANEOUS
  Filled 2023-04-07: qty 0.6

## 2023-04-07 NOTE — Telephone Encounter (Addendum)
-----   Message from Loa Socks, NP sent at 04/06/2023 10:03 PM EDT ----- Please call patient.  Recommend increased potassium intake.   Contacted patient. LVM with information as in message above. Advised that message will also be sent to her via MyChart with list of foods high in potassium.

## 2023-04-13 ENCOUNTER — Telehealth: Payer: Self-pay | Admitting: *Deleted

## 2023-04-13 NOTE — Telephone Encounter (Signed)
Received call from pt with complaint of intermittent tingling sensation in bilateral hands.  Per MD pt needing to monitor symptoms and will f/u during next office visit for possible decrease in chemo.  Pt educated and verbalized understanding.

## 2023-04-14 ENCOUNTER — Telehealth: Payer: Self-pay | Admitting: *Deleted

## 2023-04-14 NOTE — Telephone Encounter (Signed)
Received call from pt stating she has noticed a different size in her vaginal opening, pt states opening looks smaller than usual.  Per MD pt needing to f/u with gynecologist for further evaluation and treatment. Pt educated and verbalized understanding.

## 2023-04-18 ENCOUNTER — Encounter: Payer: Self-pay | Admitting: Hematology and Oncology

## 2023-04-18 NOTE — Progress Notes (Signed)
Received voicemail from patient whom received my card in May per my request.  Called patient to introduce myself as Dance movement psychotherapist and to offer available resources. Discussed one-time $1000 Marketing executive to assist with personal expenses while going through treatment. Based on verbal income guidelines, she exceeds the allowed amount. She verbalized understanding. Advised to contact me should anything change.  Asked if she has met ded/OOP. She states she thinks she has but has received a statement in MyChart. Advised to check EOBs which she states she has received and confirm with insurance company whether or not they are not covering at 100%. She verbalized understanding.  She has my card for any additional financial questions or concerns.

## 2023-04-23 NOTE — Progress Notes (Signed)
Patient Care Team: [name], MD as PCP - General Serena Croissant, MD as Consulting Physician (Hematology and Oncology) Antony Blackbird, MD as Consulting Physician (Radiation Oncology) Emelia Loron, MD as Consulting Physician (General Surgery) Pershing Proud, RN as Oncology Nurse Navigator Donnelly Angelica, RN as Oncology Nurse Navigator  DIAGNOSIS: No diagnosis found.  SUMMARY OF ONCOLOGIC HISTORY: Oncology History  Malignant neoplasm of upper-outer quadrant of left breast in female, estrogen receptor positive (HCC)  01/14/2023 Initial Diagnosis   Screening mammogram detected left breast mass UOQ 12:30 position: 1.1 cm, ultrasound biopsy revealed poorly differentiated IDC grade 3, ER 100%, PR 3%, Ki-67 70%, HER2 negative, axillary lymph node biopsy: Benign concordant   01/26/2023 Cancer Staging   Staging form: Breast, AJCC 8th Edition - Clinical: Stage IA (cT1c, cN0, cM0, G3, ER+, PR+, HER2-) - Signed by Serena Croissant, MD on 01/26/2023 Histologic grading system: 3 grade system   02/03/2023 Genetic Testing   Negative [facility] CustomNext-Cancer +RNAinsight Panel.  Report date is 02/10/2023.   The CustomNext-Cancer+RNAinsight panel offered by [facility] includes sequencing, rearrangement, and RNA analysis for the following 47 genes:  APC, ATM, AXIN2, BAP1, BARD1, BMPR1A, BRCA1, BRCA2, BRIP1, CDH1, CDK4, CDKN2A, CHEK2, CTNNA1, DICER1, EPCAM, FH, GREM1, HOXB13, KIT, MBD4, MEN1, MLH1, MSH2, MSH3, MSH6, MUTYH, NF1, NTHL1, PALB2, PDGFRA, PMS2, POLD1, POLE, PTEN, RAD51C, RAD51D, SDHA, SDHB, SDHC, SDHD, SMAD4, SMARCA4, STK11, TP53, TSC1, TSC2, VHL.      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    03/01/2023 Cancer Staging   Staging form: Breast, AJCC 8th Edition - Pathologic: Stage IA (pT1c, pN0, cM0, G3, ER+, PR+, HER2-) - Signed by Serena Croissant, MD on 03/01/2023 Histologic  grading system: 3 grade system   03/03/2023 Oncotype testing   Oncotype DX recurrence score 36: Distant recurrence at 9 years: 24%   03/16/2023 -  Chemotherapy   Patient is on Treatment Plan : BREAST TC q21d       CHIEF COMPLIANT: Discuss neuropathy  INTERVAL HISTORY: Sue Beasley is a 52 y.o. female is here because of recent diagnosis of left breast cancer. She presents to the clinic for a follow-up.    ALLERGIES:  is allergic to peanut-containing drug products, soy allergy, egg-derived products, oxycodone, amoxicillin, amoxicillin-pot clavulanate, and shellfish-derived products.  MEDICATIONS:  Current Outpatient Medications  Medication Sig Dispense Refill   albuterol (VENTOLIN HFA) 108 (90 Base) MCG/ACT inhaler Inhale 2 puffs into the lungs every 6 (six) hours as needed for shortness of breath or wheezing.     azelastine (ASTELIN) 0.1 % nasal spray Place 1 spray into both nostrils in the morning.  5   desoximetasone (TOPICORT) 0.25 % cream Apply 1 Application topically 2 (two) times daily as needed (eczema).     dexamethasone (DECADRON) 4 MG tablet Take 1 tablet (4 mg total) by mouth daily. Take 1 tab day before chemo and 1 tab day after 8 tablet 0   diphenhydrAMINE (BENADRYL) 25 MG tablet Take 25 mg by mouth daily as needed (food allergies (with restaurant eating)).     dorzolamide-timolol (COSOPT) 22.3-6.8 MG/ML ophthalmic solution Place 1 drop into both eyes 2 (two) times daily.     EPINEPHrine 0.3 mg/0.3 mL IJ SOAJ injection Inject 0.3 mg into the muscle as needed for anaphylaxis.     ferrous sulfate 325 (65 FE) MG tablet Take 325 mg by mouth 3 (three)  times a week.     fluticasone (FLONASE) 50 MCG/ACT nasal spray Place 2 sprays into both nostrils in the morning.  4   fluticasone-salmeterol (WIXELA INHUB) 250-50 MCG/ACT AEPB Inhale 1 puff into the lungs in the morning.     ketotifen (ZADITOR) 0.035 % ophthalmic solution Place 1-2 drops into both eyes 2 (two) times daily as  needed (allergies.).     latanoprost (XALATAN) 0.005 % ophthalmic solution Place 1 drop into both eyes at bedtime.     lidocaine-prilocaine (EMLA) cream Apply to affected area once 30 g 3   loratadine (CLARITIN) 10 MG tablet Take 10 mg by mouth in the morning.     ondansetron (ZOFRAN) 8 MG tablet Take 1 tablet (8 mg total) by mouth every 8 (eight) hours as needed for nausea or vomiting. Start on the third day after chemotherapy. 30 tablet 1   prochlorperazine (COMPAZINE) 10 MG tablet Take 1 tablet (10 mg total) by mouth every 6 (six) hours as needed for nausea or vomiting. 30 tablet 1   traMADol (ULTRAM) 50 MG tablet Take 1 tablet (50 mg total) by mouth every 6 (six) hours as needed. 10 tablet 0   No current facility-administered medications for this visit.    PHYSICAL EXAMINATION: ECOG PERFORMANCE STATUS: {CHL ONC ECOG PS:[phone]}  There were no vitals filed for this visit. There were no vitals filed for this visit.  BREAST:*** No palpable masses or nodules in either right or left breasts. No palpable axillary supraclavicular or infraclavicular adenopathy no breast tenderness or nipple discharge. (exam performed in the presence of a chaperone)  LABORATORY DATA:  I have reviewed the data as listed    Latest Ref Rng & Units 04/05/2023   11:19 AM 03/24/2023   12:30 AM 03/23/2023   10:48 AM  CMP  Glucose 70 - 99 mg/dL 161  096  045   BUN 6 - 20 mg/dL 11  10  8    Creatinine 0.44 - 1.00 mg/dL 4.09  8.11  9.14   Sodium 135 - 145 mmol/L 139  134  136   Potassium 3.5 - 5.1 mmol/L 3.3  3.4  3.8   Chloride 98 - 111 mmol/L 106  101  102   CO2 22 - 32 mmol/L 25  24  27    Calcium 8.9 - 10.3 mg/dL 8.7  8.7  8.6   Total Protein 6.5 - 8.1 g/dL 7.3  8.2  7.6   Total Bilirubin 0.3 - 1.2 mg/dL 0.2  0.4  0.3   Alkaline Phos 38 - 126 U/L 97  120  121   AST 15 - 41 U/L 51  20  21   ALT 0 - 44 U/L 56  33  23     Lab Results  Component Value Date   WBC 12.5 (H) 04/05/2023   HGB 10.7 (L)  04/05/2023   HCT 33.6 (L) 04/05/2023   MCV 68.7 (L) 04/05/2023   PLT 346 04/05/2023   NEUTROABS 8.8 (H) 04/05/2023    ASSESSMENT & PLAN:  No problem-specific Assessment & Plan notes found for this encounter.    No orders of the defined types were placed in this encounter.  The patient has a good understanding of the overall plan. she agrees with it. she will call with any problems that may develop before the next visit here. Total time spent: 30 mins including face to face time and time spent for planning, charting and co-ordination of care   Demarlo Riojas Mickie Kay, CMA  04/23/23    I Amina Menchaca, Tasean Mancha am acting as a Neurosurgeon for [employer]  ***

## 2023-04-26 MED FILL — Fosaprepitant Dimeglumine For IV Infusion 150 MG (Base Eq): INTRAVENOUS | Qty: 5 | Status: AC

## 2023-04-26 MED FILL — Dexamethasone Sodium Phosphate Inj 100 MG/10ML: INTRAMUSCULAR | Qty: 1 | Status: AC

## 2023-04-27 ENCOUNTER — Other Ambulatory Visit: Payer: Self-pay

## 2023-04-27 ENCOUNTER — Inpatient Hospital Stay (HOSPITAL_BASED_OUTPATIENT_CLINIC_OR_DEPARTMENT_OTHER): Payer: BC Managed Care – PPO | Admitting: Hematology and Oncology

## 2023-04-27 ENCOUNTER — Encounter: Payer: Self-pay | Admitting: *Deleted

## 2023-04-27 ENCOUNTER — Inpatient Hospital Stay: Payer: BC Managed Care – PPO

## 2023-04-27 ENCOUNTER — Other Ambulatory Visit: Payer: Self-pay | Admitting: Hematology and Oncology

## 2023-04-27 ENCOUNTER — Ambulatory Visit: Payer: BC Managed Care – PPO

## 2023-04-27 ENCOUNTER — Telehealth: Payer: Self-pay | Admitting: Radiation Oncology

## 2023-04-27 ENCOUNTER — Inpatient Hospital Stay: Payer: BC Managed Care – PPO | Attending: Hematology and Oncology

## 2023-04-27 VITALS — BP 156/82 | HR 89 | Temp 97.3°F | Resp 18 | Ht 61.0 in | Wt 214.2 lb

## 2023-04-27 DIAGNOSIS — C50412 Malignant neoplasm of upper-outer quadrant of left female breast: Secondary | ICD-10-CM

## 2023-04-27 DIAGNOSIS — Z95828 Presence of other vascular implants and grafts: Secondary | ICD-10-CM

## 2023-04-27 DIAGNOSIS — Z5111 Encounter for antineoplastic chemotherapy: Secondary | ICD-10-CM | POA: Diagnosis present

## 2023-04-27 DIAGNOSIS — Z170 Estrogen receptor positive status [ER+]: Secondary | ICD-10-CM | POA: Diagnosis not present

## 2023-04-27 DIAGNOSIS — Z5189 Encounter for other specified aftercare: Secondary | ICD-10-CM | POA: Diagnosis not present

## 2023-04-27 LAB — CMP (CANCER CENTER ONLY)
ALT: 36 U/L (ref 0–44)
AST: 22 U/L (ref 15–41)
Albumin: 3.5 g/dL (ref 3.5–5.0)
Alkaline Phosphatase: 86 U/L (ref 38–126)
Anion gap: 7 (ref 5–15)
BUN: 10 mg/dL (ref 6–20)
CO2: 26 mmol/L (ref 22–32)
Calcium: 8.8 mg/dL — ABNORMAL LOW (ref 8.9–10.3)
Chloride: 106 mmol/L (ref 98–111)
Creatinine: 0.71 mg/dL (ref 0.44–1.00)
GFR, Estimated: >60 mL/min (ref >60)
Glucose, Bld: 122 mg/dL — ABNORMAL HIGH (ref 70–99)
Potassium: 3.4 mmol/L — ABNORMAL LOW (ref 3.5–5.1)
Sodium: 139 mmol/L (ref 135–145)
Total Bilirubin: 0.2 mg/dL — ABNORMAL LOW (ref 0.3–1.2)
Total Protein: 6.3 g/dL — ABNORMAL LOW (ref 6.5–8.1)

## 2023-04-27 LAB — CBC WITH DIFFERENTIAL (CANCER CENTER ONLY)
Abs Immature Granulocytes: 0.07 10*3/uL (ref 0.00–0.07)
Basophils Absolute: 0.1 10*3/uL (ref 0.0–0.1)
Basophils Relative: 1 %
Eosinophils Absolute: 0.1 10*3/uL (ref 0.0–0.5)
Eosinophils Relative: 1 %
HCT: 33.9 % — ABNORMAL LOW (ref 36.0–46.0)
Hemoglobin: 10.6 g/dL — ABNORMAL LOW (ref 12.0–15.0)
Immature Granulocytes: 1 %
Lymphocytes Relative: 21 %
Lymphs Abs: 2.2 10*3/uL (ref 0.7–4.0)
MCH: 21.7 pg — ABNORMAL LOW (ref 26.0–34.0)
MCHC: 31.3 g/dL (ref 30.0–36.0)
MCV: 69.5 fL — ABNORMAL LOW (ref 80.0–100.0)
Monocytes Absolute: 0.9 10*3/uL (ref 0.1–1.0)
Monocytes Relative: 8 %
Neutro Abs: 6.9 10*3/uL (ref 1.7–7.7)
Neutrophils Relative %: 68 %
Platelet Count: 246 10*3/uL (ref 150–400)
RBC: 4.88 MIL/uL (ref 3.87–5.11)
RDW: 17.3 % — ABNORMAL HIGH (ref 11.5–15.5)
WBC Count: 10.2 10*3/uL (ref 4.0–10.5)
nRBC: 0 % (ref 0.0–0.2)

## 2023-04-27 LAB — PREGNANCY, URINE: Preg Test, Ur: NEGATIVE

## 2023-04-27 MED ORDER — SODIUM CHLORIDE 0.9% FLUSH
10.0000 mL | Freq: Once | INTRAVENOUS | Status: AC
  Administered 2023-04-27: 10 mL

## 2023-04-27 MED ORDER — SODIUM CHLORIDE 0.9 % IV SOLN: Freq: Once | INTRAVENOUS | Status: AC

## 2023-04-27 MED ORDER — SODIUM CHLORIDE 0.9% FLUSH
10.0000 mL | INTRAVENOUS | Status: DC | PRN
  Administered 2023-04-27: 10 mL

## 2023-04-27 MED ORDER — SODIUM CHLORIDE 0.9 % IV SOLN
150.0000 mg | Freq: Once | INTRAVENOUS | Status: AC
  Administered 2023-04-27: 150 mg via INTRAVENOUS
  Filled 2023-04-27: qty 150

## 2023-04-27 MED ORDER — SODIUM CHLORIDE 0.9 % IV SOLN
10.0000 mg | Freq: Once | INTRAVENOUS | Status: AC
  Administered 2023-04-27: 10 mg via INTRAVENOUS
  Filled 2023-04-27: qty 10

## 2023-04-27 MED ORDER — HEPARIN SOD (PORK) LOCK FLUSH 100 UNIT/ML IV SOLN
500.0000 [IU] | Freq: Once | INTRAVENOUS | Status: AC | PRN
  Administered 2023-04-27: 500 [IU]

## 2023-04-27 MED ORDER — PALONOSETRON HCL INJECTION 0.25 MG/5ML
0.2500 mg | Freq: Once | INTRAVENOUS | Status: AC
  Administered 2023-04-27: 0.25 mg via INTRAVENOUS
  Filled 2023-04-27: qty 5

## 2023-04-27 MED ORDER — SODIUM CHLORIDE 0.9 % IV SOLN
50.0000 mg/m2 | Freq: Once | INTRAVENOUS | Status: AC
  Administered 2023-04-27: 100 mg via INTRAVENOUS
  Filled 2023-04-27: qty 10

## 2023-04-27 MED ORDER — SODIUM CHLORIDE 0.9 % IV SOLN
400.0000 mg/m2 | Freq: Once | INTRAVENOUS | Status: AC
  Administered 2023-04-27: 800 mg via INTRAVENOUS
  Filled 2023-04-27: qty 40

## 2023-04-27 NOTE — Assessment & Plan Note (Signed)
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 Oncotype DX recurrence score: 36 (distant recurrence at 9 years: 24%)   Recommendation: Adjuvant chemotherapy with Taxotere and Cytoxan every 3 weeks x 4 Adjuvant radiation therapy Adjuvant antiestrogen therapy   Current Treatment: Taxotere/Cytoxan cycle 3 day 1   Chemo toxicities:  Fatigue: Managing with energy conservation Bone pain: Secondary to growth factor injection resolved with Tylenol Neutropenic fever: resolved.  Subsequent chemotherapy cycles were dose reduced per Dr. Earmon Phoenix recommendation. Alopecia Nail dyscrasia  Return to clinic in 3 weeks for cycle 4 After that she will receive adjuvant radiation.

## 2023-04-27 NOTE — Patient Instructions (Signed)
[facility]  Discharge Instructions: Thank you for choosing [facility] to provide your oncology and hematology care.   If you have a lab appointment with the Cancer Center, please go directly to the Cancer Center and check in at the registration area.   Wear comfortable clothing and clothing appropriate for easy access to any Portacath or PICC line.   We strive to give you quality time with your provider. You may need to reschedule your appointment if you arrive late (15 or more minutes).  Arriving late affects you and other patients whose appointments are after yours.  Also, if you miss three or more appointments without notifying the office, you may be dismissed from the clinic at the provider's discretion.      For prescription refill requests, have your pharmacy contact our office and allow 72 hours for refills to be completed.    Today you received the following chemotherapy and/or immunotherapy agents: Docetaxel, Cytoxan.       To help prevent nausea and vomiting after your treatment, we encourage you to take your nausea medication as directed.  BELOW ARE SYMPTOMS THAT SHOULD BE REPORTED IMMEDIATELY: *FEVER GREATER THAN 100.4 F (38 C) OR HIGHER *CHILLS OR SWEATING *NAUSEA AND VOMITING THAT IS NOT CONTROLLED WITH YOUR NAUSEA MEDICATION *UNUSUAL SHORTNESS OF BREATH *UNUSUAL BRUISING OR BLEEDING *URINARY PROBLEMS (pain or burning when urinating, or frequent urination) *BOWEL PROBLEMS (unusual diarrhea, constipation, pain near the anus) TENDERNESS IN MOUTH AND THROAT WITH OR WITHOUT PRESENCE OF ULCERS (sore throat, sores in mouth, or a toothache) UNUSUAL RASH, SWELLING OR PAIN  UNUSUAL VAGINAL DISCHARGE OR ITCHING   Items with * indicate a potential emergency and should be followed up as soon as possible or go to the Emergency Department if any problems should occur.  Please show the CHEMOTHERAPY ALERT CARD or IMMUNOTHERAPY ALERT  CARD at check-in to the Emergency Department and triage nurse.  Should you have questions after your visit or need to cancel or reschedule your appointment, please contact [facility]  Dept: [phone]  and follow the prompts.  Office hours are 8:00 a.m. to 4:30 p.m. Monday - Friday. Please note that voicemails left after 4:00 p.m. may not be returned until the following business day.  We are closed weekends and major holidays. You have access to a nurse at all times for urgent questions. Please call the main number to the clinic Dept: [phone] and follow the prompts.   For any non-urgent questions, you may also contact your provider using MyChart. We now offer e-Visits for anyone 18 and older to request care online for non-urgent symptoms. For details visit [email or web address].   Also download the MyChart app! Go to the app store, search "MyChart", open the app, select [facility], and log in with your MyChart username and password.   

## 2023-04-27 NOTE — Telephone Encounter (Signed)
LVM to schedule CON/SIM 3weeks post-chemo with Dr. Roselind Messier.

## 2023-04-29 ENCOUNTER — Inpatient Hospital Stay: Payer: BC Managed Care – PPO

## 2023-04-29 VITALS — BP 121/55 | HR 81 | Temp 98.5°F | Resp 18

## 2023-04-29 DIAGNOSIS — Z5111 Encounter for antineoplastic chemotherapy: Secondary | ICD-10-CM | POA: Diagnosis not present

## 2023-04-29 DIAGNOSIS — Z170 Estrogen receptor positive status [ER+]: Secondary | ICD-10-CM

## 2023-04-29 DIAGNOSIS — C50412 Malignant neoplasm of upper-outer quadrant of left female breast: Secondary | ICD-10-CM

## 2023-04-29 MED ORDER — PEGFILGRASTIM-JMDB 6 MG/0.6ML ~~LOC~~ SOSY
6.0000 mg | PREFILLED_SYRINGE | Freq: Once | SUBCUTANEOUS | Status: AC
  Administered 2023-04-29: 6 mg via SUBCUTANEOUS
  Filled 2023-04-29: qty 0.6

## 2023-05-03 ENCOUNTER — Encounter: Payer: Self-pay | Admitting: General Practice

## 2023-05-03 NOTE — Progress Notes (Signed)
[facility] Spiritual Care Note  Jenesys phoned to check in after missing her regular Spiritual Care visit at her last treatment (while chaplain was on vacation). She is navigating the challenges that come with exuberant support, such as setting boundaries and demonstrating all that she is able to do (and wants to do on her own) even in the midst of treatment. She is also adjusting to the idea of completing chemo and moving into the next stage of her treatment.  Lance Bosch plans to attend the [employer]-year [employer] Retreat hosted by [facility] on Friday, planning to come alone (rather than inviting a caregiver) in order to exercise her autonomy. We also plan to follow up at her next infusion.   [address] Rush Barer, [place], [facility] Pager [phone] Voicemail [phone]

## 2023-05-17 MED FILL — Dexamethasone Sodium Phosphate Inj 100 MG/10ML: INTRAMUSCULAR | Qty: 1 | Status: AC

## 2023-05-17 MED FILL — Fosaprepitant Dimeglumine For IV Infusion 150 MG (Base Eq): INTRAVENOUS | Qty: 5 | Status: AC

## 2023-05-18 ENCOUNTER — Inpatient Hospital Stay (HOSPITAL_BASED_OUTPATIENT_CLINIC_OR_DEPARTMENT_OTHER): Payer: BC Managed Care – PPO | Admitting: Hematology and Oncology

## 2023-05-18 ENCOUNTER — Inpatient Hospital Stay: Payer: BC Managed Care – PPO

## 2023-05-18 ENCOUNTER — Ambulatory Visit: Payer: BC Managed Care – PPO | Admitting: Hematology and Oncology

## 2023-05-18 ENCOUNTER — Other Ambulatory Visit: Payer: Self-pay

## 2023-05-18 ENCOUNTER — Encounter: Payer: Self-pay | Admitting: General Practice

## 2023-05-18 VITALS — BP 156/76 | HR 78 | Temp 97.7°F | Resp 18 | Ht 61.0 in | Wt 216.0 lb

## 2023-05-18 DIAGNOSIS — Z95828 Presence of other vascular implants and grafts: Secondary | ICD-10-CM

## 2023-05-18 DIAGNOSIS — Z170 Estrogen receptor positive status [ER+]: Secondary | ICD-10-CM

## 2023-05-18 DIAGNOSIS — C50412 Malignant neoplasm of upper-outer quadrant of left female breast: Secondary | ICD-10-CM

## 2023-05-18 DIAGNOSIS — Z5111 Encounter for antineoplastic chemotherapy: Secondary | ICD-10-CM | POA: Diagnosis not present

## 2023-05-18 LAB — CMP (CANCER CENTER ONLY)
ALT: 8 U/L (ref 0–44)
AST: 19 U/L (ref 15–41)
Albumin: 3.8 g/dL (ref 3.5–5.0)
Alkaline Phosphatase: 103 U/L (ref 38–126)
Anion gap: 9 (ref 5–15)
BUN: 9 mg/dL (ref 6–20)
CO2: 24 mmol/L (ref 22–32)
Calcium: 9 mg/dL (ref 8.9–10.3)
Chloride: 106 mmol/L (ref 98–111)
Creatinine: 0.63 mg/dL (ref 0.44–1.00)
GFR, Estimated: >60 mL/min (ref >60)
Glucose, Bld: 161 mg/dL — ABNORMAL HIGH (ref 70–99)
Potassium: 3.5 mmol/L (ref 3.5–5.1)
Sodium: 139 mmol/L (ref 135–145)
Total Bilirubin: 0.3 mg/dL (ref 0.3–1.2)
Total Protein: 6.7 g/dL (ref 6.5–8.1)

## 2023-05-18 LAB — CBC WITH DIFFERENTIAL (CANCER CENTER ONLY)
Abs Immature Granulocytes: 0.06 10*3/uL (ref 0.00–0.07)
Basophils Absolute: 0.1 10*3/uL (ref 0.0–0.1)
Basophils Relative: 1 %
Eosinophils Absolute: 0.1 10*3/uL (ref 0.0–0.5)
Eosinophils Relative: 1 %
HCT: 34.1 % — ABNORMAL LOW (ref 36.0–46.0)
Hemoglobin: 10.8 g/dL — ABNORMAL LOW (ref 12.0–15.0)
Immature Granulocytes: 1 %
Lymphocytes Relative: 17 %
Lymphs Abs: 1.4 10*3/uL (ref 0.7–4.0)
MCH: 22 pg — ABNORMAL LOW (ref 26.0–34.0)
MCHC: 31.7 g/dL (ref 30.0–36.0)
MCV: 69.3 fL — ABNORMAL LOW (ref 80.0–100.0)
Monocytes Absolute: 0.5 10*3/uL (ref 0.1–1.0)
Monocytes Relative: 6 %
Neutro Abs: 5.9 10*3/uL (ref 1.7–7.7)
Neutrophils Relative %: 74 %
Platelet Count: 303 10*3/uL (ref 150–400)
RBC: 4.92 MIL/uL (ref 3.87–5.11)
RDW: 19 % — ABNORMAL HIGH (ref 11.5–15.5)
WBC Count: 7.9 10*3/uL (ref 4.0–10.5)
nRBC: 0 % (ref 0.0–0.2)

## 2023-05-18 LAB — PREGNANCY, URINE: Preg Test, Ur: NEGATIVE

## 2023-05-18 MED ORDER — HEPARIN SOD (PORK) LOCK FLUSH 100 UNIT/ML IV SOLN
500.0000 [IU] | Freq: Once | INTRAVENOUS | Status: AC | PRN
  Administered 2023-05-18: 500 [IU]

## 2023-05-18 MED ORDER — PALONOSETRON HCL INJECTION 0.25 MG/5ML
0.2500 mg | Freq: Once | INTRAVENOUS | Status: AC
  Administered 2023-05-18: 0.25 mg via INTRAVENOUS
  Filled 2023-05-18: qty 5

## 2023-05-18 MED ORDER — SODIUM CHLORIDE 0.9 % IV SOLN
400.0000 mg/m2 | Freq: Once | INTRAVENOUS | Status: AC
  Administered 2023-05-18: 800 mg via INTRAVENOUS
  Filled 2023-05-18: qty 40

## 2023-05-18 MED ORDER — SODIUM CHLORIDE 0.9% FLUSH
10.0000 mL | Freq: Once | INTRAVENOUS | Status: AC
  Administered 2023-05-18: 10 mL

## 2023-05-18 MED ORDER — SODIUM CHLORIDE 0.9 % IV SOLN: Freq: Once | INTRAVENOUS | Status: AC

## 2023-05-18 MED ORDER — SODIUM CHLORIDE 0.9 % IV SOLN
50.0000 mg/m2 | Freq: Once | INTRAVENOUS | Status: AC
  Administered 2023-05-18: 100 mg via INTRAVENOUS
  Filled 2023-05-18: qty 10

## 2023-05-18 MED ORDER — SODIUM CHLORIDE 0.9 % IV SOLN
150.0000 mg | Freq: Once | INTRAVENOUS | Status: AC
  Administered 2023-05-18: 150 mg via INTRAVENOUS
  Filled 2023-05-18: qty 150

## 2023-05-18 MED ORDER — SODIUM CHLORIDE 0.9% FLUSH
10.0000 mL | INTRAVENOUS | Status: DC | PRN
  Administered 2023-05-18: 10 mL

## 2023-05-18 MED ORDER — SODIUM CHLORIDE 0.9 % IV SOLN
10.0000 mg | Freq: Once | INTRAVENOUS | Status: AC
  Administered 2023-05-18: 10 mg via INTRAVENOUS
  Filled 2023-05-18: qty 10

## 2023-05-18 NOTE — Patient Instructions (Signed)
[facility]  Discharge Instructions: Thank you for choosing [facility] to provide your oncology and hematology care.   If you have a lab appointment with the Cancer Center, please go directly to the Cancer Center and check in at the registration area.   Wear comfortable clothing and clothing appropriate for easy access to any Portacath or PICC line.   We strive to give you quality time with your provider. You may need to reschedule your appointment if you arrive late (15 or more minutes).  Arriving late affects you and other patients whose appointments are after yours.  Also, if you miss three or more appointments without notifying the office, you may be dismissed from the clinic at the provider's discretion.      For prescription refill requests, have your pharmacy contact our office and allow 72 hours for refills to be completed.    Today you received the following chemotherapy and/or immunotherapy agents: Docetaxel, Cytoxan.       To help prevent nausea and vomiting after your treatment, we encourage you to take your nausea medication as directed.  BELOW ARE SYMPTOMS THAT SHOULD BE REPORTED IMMEDIATELY: *FEVER GREATER THAN 100.4 F (38 C) OR HIGHER *CHILLS OR SWEATING *NAUSEA AND VOMITING THAT IS NOT CONTROLLED WITH YOUR NAUSEA MEDICATION *UNUSUAL SHORTNESS OF BREATH *UNUSUAL BRUISING OR BLEEDING *URINARY PROBLEMS (pain or burning when urinating, or frequent urination) *BOWEL PROBLEMS (unusual diarrhea, constipation, pain near the anus) TENDERNESS IN MOUTH AND THROAT WITH OR WITHOUT PRESENCE OF ULCERS (sore throat, sores in mouth, or a toothache) UNUSUAL RASH, SWELLING OR PAIN  UNUSUAL VAGINAL DISCHARGE OR ITCHING   Items with * indicate a potential emergency and should be followed up as soon as possible or go to the Emergency Department if any problems should occur.  Please show the CHEMOTHERAPY ALERT CARD or IMMUNOTHERAPY ALERT  CARD at check-in to the Emergency Department and triage nurse.  Should you have questions after your visit or need to cancel or reschedule your appointment, please contact [facility]  Dept: [phone]  and follow the prompts.  Office hours are 8:00 a.m. to 4:30 p.m. Monday - Friday. Please note that voicemails left after 4:00 p.m. may not be returned until the following business day.  We are closed weekends and major holidays. You have access to a nurse at all times for urgent questions. Please call the main number to the clinic Dept: [phone] and follow the prompts.   For any non-urgent questions, you may also contact your provider using MyChart. We now offer e-Visits for anyone 18 and older to request care online for non-urgent symptoms. For details visit [email or web address].   Also download the MyChart app! Go to the app store, search "MyChart", open the app, select [facility], and log in with your MyChart username and password.   

## 2023-05-18 NOTE — Progress Notes (Signed)
[facility] Spiritual Care Note  Followed up with Sue Beasley in infusion after she was able to attend the recent [employer]-sponsored [employer]. She reports finding the experience relaxing, and she was proud to share her art creations with her family. Today we also talked about ringing the [facility]; her current sense was that she would like to ring the mobile bell after her final chemo treatment today on the balcony with her friend who was there for support, rather than to create a big event with family and friends.  Provided reflective listening, normalization of feelings, and emotional support. Sue Beasley is aware of ongoing chaplain availability; now that she will be done with chemo, she plans to reach out as needed to set up an appointment during her radiation series when she will be on campus for treatment anyway.   [address] Rush Barer, [place], [facility] Pager [phone] Voicemail [phone]

## 2023-05-18 NOTE — Progress Notes (Signed)
Patient Care Team: [name], MD as PCP - General Serena Croissant, MD as Consulting Physician (Hematology and Oncology) Antony Blackbird, MD as Consulting Physician (Radiation Oncology) Emelia Loron, MD as Consulting Physician (General Surgery) Pershing Proud, RN as Oncology Nurse Navigator Donnelly Angelica, RN as Oncology Nurse Navigator  DIAGNOSIS:  Encounter Diagnosis  Name Primary?   Malignant neoplasm of upper-outer quadrant of left breast in female, estrogen receptor positive (HCC) Yes    SUMMARY OF ONCOLOGIC HISTORY: Oncology History  Malignant neoplasm of upper-outer quadrant of left breast in female, estrogen receptor positive (HCC)  01/14/2023 Initial Diagnosis   Screening mammogram detected left breast mass UOQ 12:30 position: 1.1 cm, ultrasound biopsy revealed poorly differentiated IDC grade 3, ER 100%, PR 3%, Ki-67 70%, HER2 negative, axillary lymph node biopsy: Benign concordant   01/26/2023 Cancer Staging   Staging form: Breast, AJCC 8th Edition - Clinical: Stage IA (cT1c, cN0, cM0, G3, ER+, PR+, HER2-) - Signed by Serena Croissant, MD on 01/26/2023 Histologic grading system: 3 grade system   02/03/2023 Genetic Testing   Negative [facility] CustomNext-Cancer +RNAinsight Panel.  Report date is 02/10/2023.   The CustomNext-Cancer+RNAinsight panel offered by [facility] includes sequencing, rearrangement, and RNA analysis for the following 47 genes:  APC, ATM, AXIN2, BAP1, BARD1, BMPR1A, BRCA1, BRCA2, BRIP1, CDH1, CDK4, CDKN2A, CHEK2, CTNNA1, DICER1, EPCAM, FH, GREM1, HOXB13, KIT, MBD4, MEN1, MLH1, MSH2, MSH3, MSH6, MUTYH, NF1, NTHL1, PALB2, PDGFRA, PMS2, POLD1, POLE, PTEN, RAD51C, RAD51D, SDHA, SDHB, SDHC, SDHD, SMAD4, SMARCA4, STK11, TP53, TSC1, TSC2, VHL.      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    03/01/2023 Cancer Staging   Staging form: Breast, AJCC 8th  Edition - Pathologic: Stage IA (pT1c, pN0, cM0, G3, ER+, PR+, HER2-) - Signed by Serena Croissant, MD on 03/01/2023 Histologic grading system: 3 grade system   03/03/2023 Oncotype testing   Oncotype DX recurrence score 36: Distant recurrence at 9 years: 24%   03/16/2023 -  Chemotherapy   Patient is on Treatment Plan : BREAST TC q21d       CHIEF COMPLIANT: Taxotere/Cytoxan cycle 4  INTERVAL HISTORY: Sue Beasley is a 52 y.o. female is here because of recent diagnosis of left breast cancer. She presents to the clinic for a follow-up. Pt reports to the clinic today tingling in the tips of fingers. She says it is random. Denies any neuropathy in feet. Denies any nausea some metallic taste in mouth.   ALLERGIES:  is allergic to peanut-containing drug products, soy allergy, egg-derived products, oxycodone, amoxicillin, amoxicillin-pot clavulanate, and shellfish-derived products.  MEDICATIONS:  Current Outpatient Medications  Medication Sig Dispense Refill   Cholecalciferol 50 MCG (2000 UT) TABS 1 tablet Orally Once a day for 30 day(s)     albuterol (VENTOLIN HFA) 108 (90 Base) MCG/ACT inhaler Inhale 2 puffs into the lungs every 6 (six) hours as needed for shortness of breath or wheezing.     azelastine (ASTELIN) 0.1 % nasal spray Place 1 spray into both nostrils in the morning.  5   desoximetasone (TOPICORT) 0.25 % cream Apply 1 Application topically 2 (two) times daily as needed (eczema).     dexamethasone (DECADRON) 4 MG tablet Take 1 tablet (4 mg total) by mouth daily. Take 1 tab day before chemo and 1 tab day after 8 tablet 0   diphenhydrAMINE (BENADRYL) 25 MG tablet Take  25 mg by mouth daily as needed (food allergies (with restaurant eating)).     dorzolamide-timolol (COSOPT) 22.3-6.8 MG/ML ophthalmic solution Place 1 drop into both eyes 2 (two) times daily.     EPINEPHrine 0.3 mg/0.3 mL IJ SOAJ injection Inject 0.3 mg into the muscle as needed for anaphylaxis.     ferrous sulfate 325 (65  FE) MG tablet Take 325 mg by mouth 3 (three) times a week.     fluticasone (FLONASE) 50 MCG/ACT nasal spray Place 2 sprays into both nostrils in the morning.  4   fluticasone-salmeterol (WIXELA INHUB) 250-50 MCG/ACT AEPB Inhale 1 puff into the lungs in the morning.     ketotifen (ZADITOR) 0.035 % ophthalmic solution Place 1-2 drops into both eyes 2 (two) times daily as needed (allergies.).     latanoprost (XALATAN) 0.005 % ophthalmic solution Place 1 drop into both eyes at bedtime.     lidocaine-prilocaine (EMLA) cream Apply to affected area once 30 g 3   loratadine (CLARITIN) 10 MG tablet Take 10 mg by mouth in the morning.     norethindrone-ethinyl estradiol 1/35 (ALAYCEN 1/35) tablet Alyacen 1/35     ondansetron (ZOFRAN) 8 MG tablet Take 1 tablet (8 mg total) by mouth every 8 (eight) hours as needed for nausea or vomiting. Start on the third day after chemotherapy. 30 tablet 1   prochlorperazine (COMPAZINE) 10 MG tablet Take 1 tablet (10 mg total) by mouth every 6 (six) hours as needed for nausea or vomiting. 30 tablet 1   traMADol (ULTRAM) 50 MG tablet Take 1 tablet (50 mg total) by mouth every 6 (six) hours as needed. 10 tablet 0   No current facility-administered medications for this visit.   Facility-Administered Medications Ordered in Other Visits  Medication Dose Route Frequency Provider Last Rate Last Admin   0.9 %  sodium chloride infusion   Intravenous Once Serena Croissant, MD       cyclophosphamide (CYTOXAN) 800 mg in sodium chloride 0.9 % 250 mL chemo infusion  400 mg/m2 (Treatment Plan Recorded) Intravenous Once Serena Croissant, MD       dexamethasone (DECADRON) 10 mg in sodium chloride 0.9 % 50 mL IVPB  10 mg Intravenous Once Serena Croissant, MD       DOCEtaxel (TAXOTERE) 100 mg in sodium chloride 0.9 % 250 mL chemo infusion  50 mg/m2 (Treatment Plan Recorded) Intravenous Once Serena Croissant, MD       fosaprepitant (EMEND) 150 mg in sodium chloride 0.9 % 145 mL IVPB  150 mg Intravenous  Once Serena Croissant, MD       heparin lock flush 100 unit/mL  500 Units Intracatheter Once PRN Serena Croissant, MD       palonosetron (ALOXI) injection 0.25 mg  0.25 mg Intravenous Once Serena Croissant, MD       sodium chloride flush (NS) 0.9 % injection 10 mL  10 mL Intracatheter PRN Serena Croissant, MD        PHYSICAL EXAMINATION: ECOG PERFORMANCE STATUS: 1 - Symptomatic but completely ambulatory  Vitals:   05/18/23 0815  BP: (!) 156/76  Pulse: 78  Resp: 18  Temp: 97.7 F (36.5 C)  SpO2: 99%   Filed Weights   05/18/23 0815  Weight: 216 lb (98 kg)      LABORATORY DATA:  I have reviewed the data as listed    Latest Ref Rng & Units 05/18/2023    7:36 AM 04/27/2023    7:56 AM 04/05/2023   11:19 AM  CMP  Glucose 70 - 99 mg/dL 161  096  045   BUN 6 - 20 mg/dL 9  10  11    Creatinine 0.44 - 1.00 mg/dL 4.09  8.11  9.14   Sodium 135 - 145 mmol/L 139  139  139   Potassium 3.5 - 5.1 mmol/L 3.5  3.4  3.3   Chloride 98 - 111 mmol/L 106  106  106   CO2 22 - 32 mmol/L 24  26  25    Calcium 8.9 - 10.3 mg/dL 9.0  8.8  8.7   Total Protein 6.5 - 8.1 g/dL 6.7  6.3  7.3   Total Bilirubin 0.3 - 1.2 mg/dL 0.3  0.2  0.2   Alkaline Phos 38 - 126 U/L 103  86  97   AST 15 - 41 U/L 19  22  51   ALT 0 - 44 U/L 8  36  56     Lab Results  Component Value Date   WBC 7.9 05/18/2023   HGB 10.8 (L) 05/18/2023   HCT 34.1 (L) 05/18/2023   MCV 69.3 (L) 05/18/2023   PLT 303 05/18/2023   NEUTROABS 5.9 05/18/2023    ASSESSMENT & PLAN:  Malignant neoplasm of upper-outer quadrant of left breast in female, estrogen receptor positive (HCC) 02/14/2023: Left lumpectomy: 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 Oncotype DX recurrence score: 36 (distant recurrence at 9 years: 24%)   Recommendation: Adjuvant chemotherapy with Taxotere and Cytoxan every 3 weeks x 4 Adjuvant radiation therapy Adjuvant antiestrogen therapy   Current  Treatment: Taxotere/Cytoxan cycle 4    Chemo toxicities:  Fatigue: Managing with energy conservation Bone pain: No further issues with the bones Neutropenic fever: resolved.  Alopecia Nail dyscrasia Weight gain. Mild tingling at the right fingertips intermittently: Will keep the dosage the same for this treatment.   Return to clinic after radiation is complete I discussed with her that we will have a 1 hour-long survivorship care plan visit to discuss surveillance plan including Signatera.    No orders of the defined types were placed in this encounter.  The patient has a good understanding of the overall plan. she agrees with it. she will call with any problems that may develop before the next visit here. Total time spent: 30 mins including face to face time and time spent for planning, charting and co-ordination of care   Tamsen Meek, MD 05/18/23    I Janan Ridge am acting as a Neurosurgeon for [employer]  I have reviewed the above documentation for accuracy and completeness, and I agree with the above.

## 2023-05-18 NOTE — Assessment & Plan Note (Addendum)
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 Oncotype DX recurrence score: 36 (distant recurrence at 9 years: 24%)   Recommendation: Adjuvant chemotherapy with Taxotere and Cytoxan every 3 weeks x 4 Adjuvant radiation therapy Adjuvant antiestrogen therapy   Current Treatment: Taxotere/Cytoxan cycle 4    Chemo toxicities:  Fatigue: Managing with energy conservation Bone pain: No further issues with the bones Neutropenic fever: resolved.  Alopecia Nail dyscrasia Weight gain. Mild tingling at the right fingertips intermittently: Will keep the dosage the same for this treatment.   Return to clinic after radiation is complete I discussed with her that we will have a 1 hour-long survivorship care plan visit to discuss surveillance plan including Signatera.

## 2023-05-19 ENCOUNTER — Encounter: Payer: Self-pay | Admitting: *Deleted

## 2023-05-20 ENCOUNTER — Other Ambulatory Visit: Payer: Self-pay

## 2023-05-20 ENCOUNTER — Inpatient Hospital Stay: Payer: BC Managed Care – PPO

## 2023-05-20 VITALS — BP 127/73 | HR 69 | Temp 98.7°F | Resp 18

## 2023-05-20 DIAGNOSIS — C50412 Malignant neoplasm of upper-outer quadrant of left female breast: Secondary | ICD-10-CM

## 2023-05-20 DIAGNOSIS — Z5111 Encounter for antineoplastic chemotherapy: Secondary | ICD-10-CM | POA: Diagnosis not present

## 2023-05-20 MED ORDER — PEGFILGRASTIM-JMDB 6 MG/0.6ML ~~LOC~~ SOSY
6.0000 mg | PREFILLED_SYRINGE | Freq: Once | SUBCUTANEOUS | Status: AC
  Administered 2023-05-20: 6 mg via SUBCUTANEOUS
  Filled 2023-05-20: qty 0.6

## 2023-05-23 ENCOUNTER — Other Ambulatory Visit: Payer: Self-pay | Admitting: General Surgery

## 2023-05-23 NOTE — Progress Notes (Signed)
Location of Breast Cancer: Malignant neoplasm of upper-outer quadrant of left breast in female, estrogen receptor positive   Histology per Pathology Report:  FINAL MICROSCOPIC DIAGNOSIS:  A. BREAST, LEFT, LUMPECTOMY:      Invasive ductal carcinoma, 1.9 cm, grade 3 Ductal carcinoma in situ:  Not identified Margins, invasive:  Negative           Closest, invasive:  > 2 mm from the medial margin Margins, DCIS:  NA      Closest, DCIS:  NA Lymphovascular invasion:  Not identified Prognostic markers: ER: 100%, Positive, strong staining intensity PR: 3%, Positive, strong staining intensity Her2: Negative, 0 Ki-67: 70% See oncology table  B. LYMPH NODE, LEFT AXILLARY, SENTINEL, EXCISION:      One benign lymph node, negative for metastatic carcinoma (0/1).  C. LYMPH NODE, LEFT AXILLARY, SENTINEL, EXCISION:      One benign lymph node, negative for metastatic carcinoma (0/1).  D. LYMPH NODE, LEFT AXILLARY, SENTINEL, EXCISION:      One benign lymph node, negative for metastatic carcinoma (0/1).  E. LYMPH NODE, LEFT AXILLARY, SENTINEL, EXCISION:      One benign lymph node, negative for metastatic carcinoma (0/1).  F. LYMPH NODE, LEFT AXILLARY, SENTINEL, EXCISION:      One benign lymph node, negative for metastatic carcinoma (0/1).  G. LYMPH NODE, LEFT AXILLARY, SENTINEL, EXCISION:      One benign lymph node, negative for metastatic carcinoma (0/1).  H. LYMPH NODE, LEFT AXILLARY, SENTINEL, EXCISION:      One benign lymph node, negative for metastatic carcinoma (0/1).  I. LYMPH NODE, LEFT AXILLARY, SENTINEL, EXCISION:      One benign lymph node, negative for metastatic carcinoma (0/1).  J. BREAST, LEFT ADDITIONAL MEDIAL MARGIN, EXCISION:      Benign breast tissue, negative for malignancy.  K. BREAST, LEFT ADDITIONAL INFERIOR MARGIN, EXCISION:      Benign breast tissue, negative for malignancy.  Got had a ultrafiltration  L. BREAST, LEFT ADDITIONAL POSTERIOR MARGIN, EXCISION:       Benign breast tissue, negative for malignancy.  ON ONCOLOGY TABLE:  Procedure: Lumpectomy Specimen Laterality: Left Histologic Type: Invasive ductal carcinoma Histologic Grade:      Glandular (Acinar)/Tubular Differentiation: 3      Nuclear Pleomorphism: 3      Mitotic Rate: 3      Overall Grade: 3 Tumor Size: 1.9 cm Ductal Carcinoma In Situ: Not identified Lymphatic and/or Vascular Invasion: Identified Treatment Effect in the Breast: No known presurgical therapy Margins: All margins negative for invasive carcinoma      Distance from Closest Margin (mm): >2 mm      Specify Closest Margin (required only if <27mm): Medial margin DCIS Margins: NA      Distance from Closest Margin (mm): NA      Specify Closest Margin (required only if <40mm): NA Regional Lymph Nodes:      Number of Lymph Nodes Examined: 8      Number of Sentinel Nodes Examined: 8      Number of Lymph Nodes with Macrometastases (>2 mm): 0      Number of Lymph Nodes with Micrometastases: 0      Number of Lymph Nodes with Isolated Tumor Cells (=0.2 mm or =200 cells): 0      Size of Largest Metastatic Deposit (mm): 0      Extranodal Extension: Not identified Distant Metastasis:      Distant Site(s) Involved: Not applicable Breast Biomarker Testing Performed on Previous Biopsy:  Testing Performed on Case Number: [id number]            Estrogen Receptor: 100%, Positive, strong staining intensity            Progesterone Receptor: 3%, Positive, strong staining intensity            HER2: Negative, 0            Ki-67: 70% Pathologic Stage Classification (pTNM, AJCC 8th Edition): pT1c, pN0 Representative Tumor Block: A2 Comment(s): None (v4.5.0.0)  Receptor Status: ER(100%), PR (3%), Her2-neu (neg), Ki-67(70%)  Did patient present with symptoms (if so, please note symptoms) or was this found on screening mammography?: screening mammogram  Past/Anticipated interventions by surgeon, if any: 02/14/2023   Procedure: 1.  Left breast radioactive seed guided lumpectomy 2.  Injection of mag trace for sentinel lymph node identification 3.  Left deep axillary sentinel lymph node biopsy Surgeon: Dr. Harden Mo  03/15/2023  Procedure: Right internal jugular vein port placement with ultrasound guidance Surgeon: Dr. Harden Mo  Past/Anticipated interventions by medical oncology, if any:  Serena Croissant, MD 05/18/2023  Oncology History  Malignant neoplasm of upper-outer quadrant of left breast in female, estrogen receptor positive (HCC)  01/14/2023 Initial Diagnosis    Screening mammogram detected left breast mass UOQ 12:30 position: 1.1 cm, ultrasound biopsy revealed poorly differentiated IDC grade 3, ER 100%, PR 3%, Ki-67 70%, HER2 negative, axillary lymph node biopsy: Benign concordant    01/26/2023 Cancer Staging    Staging form: Breast, AJCC 8th Edition - Clinical: Stage IA (cT1c, cN0, cM0, G3, ER+, PR+, HER2-) - Signed by Serena Croissant, MD on 01/26/2023 Histologic grading system: 3 grade system    02/03/2023 Genetic Testing    Negative [facility] CustomNext-Cancer +RNAinsight Panel.  Report date is 02/10/2023.    The CustomNext-Cancer+RNAinsight panel offered by [facility] includes sequencing, rearrangement, and RNA analysis for the following 47 genes:  APC, ATM, AXIN2, BAP1, BARD1, BMPR1A, BRCA1, BRCA2, BRIP1, CDH1, CDK4, CDKN2A, CHEK2, CTNNA1, DICER1, EPCAM, FH, GREM1, HOXB13, KIT, MBD4, MEN1, MLH1, MSH2, MSH3, MSH6, MUTYH, NF1, NTHL1, PALB2, PDGFRA, PMS2, POLD1, POLE, PTEN, RAD51C, RAD51D, SDHA, SDHB, SDHC, SDHD, SMAD4, SMARCA4, STK11, TP53, TSC1, TSC2, VHL.       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 Oncotype DX recurrence score: 36 (distant recurrence at 9 years: 24%)   Recommendation: Adjuvant chemotherapy with Taxotere and Cytoxan every 3 weeks x 4 Adjuvant radiation therapy Adjuvant antiestrogen therapy   Current Treatment: Taxotere/Cytoxan cycle 4    Chemo toxicities:  Fatigue: Managing with energy conservation Bone pain: No further issues with the bones Neutropenic fever: resolved.  Alopecia Nail dyscrasia Weight  gain. Mild tingling at the right fingertips intermittently: Will keep the dosage the same for this treatment.   Return to clinic after radiation is complete I discussed with her that we will have a 1 hour-long survivorship care plan visit to discuss surveillance plan including Signatera.  Lymphedema issues, if any:  no    Pain issues, if any:  no   Skin: Patient reports mild redness to left breast.    SAFETY ISSUES: Prior radiation? no Pacemaker/ICD? no Possible current pregnancy?no Is the patient on methotrexate? no  Current Complaints / other details:    BP (!) 157/85 (BP Location: Right Wrist, Patient Position: Sitting, Cuff Size: Large)   Pulse 88   Temp 97.6 F (36.4 C)   Resp 20   Ht 5\' 1"  (1.549 m)   Wt 218 lb 3.2 oz (99 kg)   SpO2 100%   BMI 41.23 kg/m

## 2023-05-24 ENCOUNTER — Other Ambulatory Visit: Payer: Self-pay

## 2023-05-30 ENCOUNTER — Ambulatory Visit: Payer: BC Managed Care – PPO | Attending: Hematology and Oncology

## 2023-05-30 VITALS — Wt 218.0 lb

## 2023-05-30 DIAGNOSIS — Z483 Aftercare following surgery for neoplasm: Secondary | ICD-10-CM | POA: Insufficient documentation

## 2023-05-30 NOTE — Therapy (Signed)
OUTPATIENT PHYSICAL THERAPY SOZO SCREENING NOTE   Patient Name: Sue Beasley MRN: [id number] DOB:1971-03-18, 52 y.o., female Today's Date: 05/30/2023  PCP: [name], MD REFERRING PROVIDER: Serena Croissant, MD   PT End of Session - 05/30/23 1503     Visit Number 2   # unchanged due to screen only   PT Start Time 1501    PT Stop Time 1504    PT Time Calculation (min) 3 min    Activity Tolerance Patient tolerated treatment well    Behavior During Therapy [facility] for tasks assessed/performed             Past Medical History:  Diagnosis Date   ALLERGIC RHINITIS 11/06/2009   Allergy    Anemia    ANEMIA-IRON DEFICIENCY 11/20/2009   Asthma    Cancer (HCC) 12/2022   left breast IDC   Eczema    Heart murmur    Irreducible umbilical hernia 03/01/2011   Wears glasses    Past Surgical History:  Procedure Laterality Date   BREAST BIOPSY Left 01/14/2023   [place] LT BREAST BX W LOC DEV 1ST LESION IMG BX SPEC US GUIDE 01/14/2023 [facility]   BREAST BIOPSY  02/10/2023   MM LT RADIOACTIVE SEED LOC MAMMO GUIDE 02/10/2023 [facility]   BREAST LUMPECTOMY WITH RADIOACTIVE SEED AND SENTINEL LYMPH NODE BIOPSY Left 02/14/2023   Procedure: LEFT BREAST LUMPECTOMY WITH RADIOACTIVE SEED AND AXILLARY SENTINEL LYMPH NODE BIOPSY;  Surgeon: Emelia Loron, MD;  Location: [facility];  Service: General;  Laterality: Left;   PORTACATH PLACEMENT Right 03/15/2023   Procedure: INSERTION PORT-A-CATH;  Surgeon: Emelia Loron, MD;  Location: [facility] OR;  Service: General;  Laterality: Right;   UMBILICAL HERNIA REPAIR  05/13/11   Patient Active Problem List   Diagnosis Date Noted   Port-A-Cath in place 03/23/2023   Genetic testing 02/04/2023   Malignant neoplasm of upper-outer quadrant of left breast in female, estrogen receptor positive (HCC) 01/24/2023   Low vitamin D level 11/19/2021   Incontinence 12/19/2020   Low mean corpuscular volume (MCV) 11/23/2020   Pain and  swelling of wrist, left 03/14/2020   Hyperglycemia 11/09/2019   Uterine leiomyoma 11/09/2019   Acute upper respiratory infection 02/12/2019   Increased body mass index 11/02/2018   Obesity 08/02/2014   Heart murmur, systolic 08/01/2014   Encounter for well adult exam with abnormal findings 03/01/2011   Hyperlipidemia 11/20/2009   Iron deficiency anemia 11/20/2009   Allergic rhinitis 11/06/2009   Asthma 11/06/2009    REFERRING DIAG: left breast cancer at risk for lymphedema  THERAPY DIAG:  Aftercare following surgery for neoplasm  PERTINENT HISTORY: 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 It measures 1.1 cm and is located in the upper outer quadrant. It is ER/PR positive HER2 negative with a Ki67 of 70%. left lumpectomy and SLNB on 02/14/23 with oncotype testing (still pending) and radiation. 8 negative nodes removed. 1 aspiration in the axilla of 75ml   PRECAUTIONS: left UE Lymphedema risk, None  SUBJECTIVE: Pt returns for her first 3 month L-Dex screen.   PAIN:  Are you having pain? No  SOZO SCREENING: Patient was assessed today using the SOZO machine to determine the lymphedema index score. This was compared to her baseline score. It was determined that she is within the recommended range when compared to her baseline and no further action is needed at this time. She will continue SOZO screenings. These are done every 3 months for 2  years post operatively followed by every 6 months for 2 years, and then annually.   L-DEX FLOWSHEETS - 05/30/23 1500       L-DEX LYMPHEDEMA SCREENING   Measurement Type Unilateral    L-DEX MEASUREMENT EXTREMITY Upper Extremity    POSITION  Standing    DOMINANT SIDE Left    At Risk Side Left    BASELINE SCORE (UNILATERAL) -1.6    L-DEX SCORE (UNILATERAL) -0.8    VALUE CHANGE (UNILAT) 0.8               Hermenia Bers, PTA 05/30/2023, 3:04 PM

## 2023-05-31 ENCOUNTER — Ambulatory Visit (HOSPITAL_BASED_OUTPATIENT_CLINIC_OR_DEPARTMENT_OTHER): Payer: BC Managed Care – PPO | Admitting: Anesthesiology

## 2023-05-31 ENCOUNTER — Other Ambulatory Visit: Payer: Self-pay

## 2023-05-31 ENCOUNTER — Encounter (HOSPITAL_BASED_OUTPATIENT_CLINIC_OR_DEPARTMENT_OTHER)
Admission: RE | Disposition: A | Discharge status: Home / Self Care | Payer: Self-pay | Source: Home / Self Care | Attending: General Surgery

## 2023-05-31 ENCOUNTER — Ambulatory Visit (HOSPITAL_BASED_OUTPATIENT_CLINIC_OR_DEPARTMENT_OTHER)
Admission: RE | Admit: 2023-05-31 | Discharge: 2023-05-31 | Disposition: A | Discharge status: Home / Self Care | Payer: BC Managed Care – PPO | Attending: General Surgery | Admitting: General Surgery

## 2023-05-31 ENCOUNTER — Encounter (HOSPITAL_BASED_OUTPATIENT_CLINIC_OR_DEPARTMENT_OTHER): Payer: Self-pay | Admitting: General Surgery

## 2023-05-31 DIAGNOSIS — Z452 Encounter for adjustment and management of vascular access device: Secondary | ICD-10-CM | POA: Diagnosis present

## 2023-05-31 DIAGNOSIS — Z9221 Personal history of antineoplastic chemotherapy: Secondary | ICD-10-CM | POA: Insufficient documentation

## 2023-05-31 DIAGNOSIS — Z01818 Encounter for other preprocedural examination: Secondary | ICD-10-CM

## 2023-05-31 DIAGNOSIS — Z853 Personal history of malignant neoplasm of breast: Secondary | ICD-10-CM | POA: Diagnosis not present

## 2023-05-31 HISTORY — PX: PORT-A-CATH REMOVAL: SHX5289

## 2023-05-31 LAB — POCT PREGNANCY, URINE: Preg Test, Ur: NEGATIVE

## 2023-05-31 SURGERY — REMOVAL PORT-A-CATH
Anesthesia: Monitor Anesthesia Care | Site: Chest

## 2023-05-31 MED ORDER — PROPOFOL 500 MG/50ML IV EMUL
INTRAVENOUS | Status: DC | PRN
  Administered 2023-05-31: 75 ug/kg/min via INTRAVENOUS

## 2023-05-31 MED ORDER — MIDAZOLAM HCL 2 MG/2ML IJ SOLN
INTRAMUSCULAR | Status: AC
  Filled 2023-05-31: qty 2

## 2023-05-31 MED ORDER — BUPIVACAINE HCL (PF) 0.25 % IJ SOLN
INTRAMUSCULAR | Status: DC | PRN
  Administered 2023-05-31: 6 mL

## 2023-05-31 MED ORDER — ONDANSETRON HCL 4 MG/2ML IJ SOLN
INTRAMUSCULAR | Status: AC
  Filled 2023-05-31: qty 2

## 2023-05-31 MED ORDER — FENTANYL CITRATE (PF) 100 MCG/2ML IJ SOLN: 25.0000 ug | INTRAMUSCULAR | Status: DC | PRN

## 2023-05-31 MED ORDER — CHLORHEXIDINE GLUCONATE CLOTH 2 % EX PADS: 6.0000 | MEDICATED_PAD | Freq: Once | CUTANEOUS | Status: DC

## 2023-05-31 MED ORDER — ONDANSETRON HCL 4 MG/2ML IJ SOLN: 4.0000 mg | Freq: Once | INTRAMUSCULAR | Status: DC | PRN

## 2023-05-31 MED ORDER — LIDOCAINE 2% (20 MG/ML) 5 ML SYRINGE
INTRAMUSCULAR | Status: DC | PRN
  Administered 2023-05-31: 40 mg via INTRAVENOUS

## 2023-05-31 MED ORDER — KETOROLAC TROMETHAMINE 30 MG/ML IJ SOLN: 30.0000 mg | Freq: Once | INTRAMUSCULAR | Status: DC | PRN

## 2023-05-31 MED ORDER — PROPOFOL 10 MG/ML IV BOLUS
INTRAVENOUS | Status: DC | PRN
  Administered 2023-05-31 (×2): 20 mg via INTRAVENOUS

## 2023-05-31 MED ORDER — FENTANYL CITRATE (PF) 100 MCG/2ML IJ SOLN
INTRAMUSCULAR | Status: AC
  Filled 2023-05-31: qty 2

## 2023-05-31 MED ORDER — DEXMEDETOMIDINE HCL IN NACL 80 MCG/20ML IV SOLN
INTRAVENOUS | Status: DC | PRN
  Administered 2023-05-31: 8 ug via INTRAVENOUS

## 2023-05-31 MED ORDER — ACETAMINOPHEN 500 MG PO TABS
1000.0000 mg | ORAL_TABLET | ORAL | Status: AC
  Administered 2023-05-31: 1000 mg via ORAL

## 2023-05-31 MED ORDER — ENSURE PRE-SURGERY PO LIQD: 296.0000 mL | Freq: Once | ORAL | Status: DC

## 2023-05-31 MED ORDER — FENTANYL CITRATE (PF) 100 MCG/2ML IJ SOLN
INTRAMUSCULAR | Status: DC | PRN
  Administered 2023-05-31: 50 ug via INTRAVENOUS

## 2023-05-31 MED ORDER — ACETAMINOPHEN 500 MG PO TABS
ORAL_TABLET | ORAL | Status: AC
  Filled 2023-05-31: qty 2

## 2023-05-31 MED ORDER — MIDAZOLAM HCL 5 MG/5ML IJ SOLN
INTRAMUSCULAR | Status: DC | PRN
  Administered 2023-05-31: 2 mg via INTRAVENOUS

## 2023-05-31 MED ORDER — LACTATED RINGERS IV SOLN: INTRAVENOUS | Status: DC

## 2023-05-31 SURGICAL SUPPLY — 30 items
ADH SKN CLS APL DERMABOND .7 (GAUZE/BANDAGES/DRESSINGS) ×1
APL PRP STRL LF DISP 70% ISPRP (MISCELLANEOUS) ×1
BLADE SURG 15 STRL LF DISP TIS (BLADE) ×1 IMPLANT
BLADE SURG 15 STRL SS (BLADE) ×1
CHLORAPREP W/TINT 26 (MISCELLANEOUS) ×1 IMPLANT
COVER BACK TABLE 60X90IN (DRAPES) ×1 IMPLANT
COVER MAYO STAND STRL (DRAPES) ×1 IMPLANT
DERMABOND ADVANCED .7 DNX12 (GAUZE/BANDAGES/DRESSINGS) ×1 IMPLANT
DRAPE LAPAROTOMY 100X72 PEDS (DRAPES) ×1 IMPLANT
DRAPE UTILITY XL STRL (DRAPES) ×1 IMPLANT
ELECT COATED BLADE 2.86 ST (ELECTRODE) ×1 IMPLANT
ELECT REM PT RETURN 9FT ADLT (ELECTROSURGICAL) ×1
ELECTRODE REM PT RTRN 9FT ADLT (ELECTROSURGICAL) ×1 IMPLANT
GLOVE BIO SURGEON STRL SZ7 (GLOVE) ×1 IMPLANT
GLOVE BIOGEL PI IND STRL 7.5 (GLOVE) ×1 IMPLANT
GOWN STRL REUS W/ TWL LRG LVL3 (GOWN DISPOSABLE) ×2 IMPLANT
GOWN STRL REUS W/TWL LRG LVL3 (GOWN DISPOSABLE) ×2
NDL HYPO 25X1 1.5 SAFETY (NEEDLE) ×1 IMPLANT
NEEDLE HYPO 25X1 1.5 SAFETY (NEEDLE) ×1
PACK BASIN DAY SURGERY FS (CUSTOM PROCEDURE TRAY) ×1 IMPLANT
PENCIL SMOKE EVACUATOR (MISCELLANEOUS) ×1 IMPLANT
SLEEVE SCD COMPRESS KNEE MED (STOCKING) IMPLANT
SPIKE FLUID TRANSFER (MISCELLANEOUS) ×1 IMPLANT
SPONGE T-LAP 4X18 ~~LOC~~+RFID (SPONGE) ×1 IMPLANT
STRIP CLOSURE SKIN 1/2X4 (GAUZE/BANDAGES/DRESSINGS) ×1 IMPLANT
SUT MNCRL AB 4-0 PS2 18 (SUTURE) ×1 IMPLANT
SUT VIC AB 3-0 SH 27 (SUTURE) ×1
SUT VIC AB 3-0 SH 27X BRD (SUTURE) ×1 IMPLANT
SYR CONTROL 10ML LL (SYRINGE) ×1 IMPLANT
TOWEL GREEN STERILE FF (TOWEL DISPOSABLE) ×1 IMPLANT

## 2023-05-31 NOTE — Anesthesia Preprocedure Evaluation (Signed)
Anesthesia Evaluation  Patient identified by MRN, date of birth, ID band Patient awake    Reviewed: Allergy & Precautions, H&P , NPO status , Patient's Chart, lab work & pertinent test results  Airway Mallampati: II  TM Distance: >3 FB Neck ROM: Full    Dental no notable dental hx.    Pulmonary asthma    Pulmonary exam normal breath sounds clear to auscultation       Cardiovascular negative cardio ROS Normal cardiovascular exam Rhythm:Regular Rate:Normal     Neuro/Psych negative neurological ROS  negative psych ROS   GI/Hepatic negative GI ROS, Neg liver ROS,,,  Endo/Other  negative endocrine ROS    Renal/GU negative Renal ROS  negative genitourinary   Musculoskeletal negative musculoskeletal ROS (+)    Abdominal   Peds negative pediatric ROS (+)  Hematology  (+) Blood dyscrasia, anemia   Anesthesia Other Findings   Reproductive/Obstetrics negative OB ROS                             Anesthesia Physical Anesthesia Plan  ASA: 2  Anesthesia Plan: MAC   Post-op Pain Management: Minimal or no pain anticipated   Induction: Intravenous  PONV Risk Score and Plan: 2 and Propofol infusion and Treatment may vary due to age or medical condition  Airway Management Planned: Simple Face Mask  Additional Equipment:   Intra-op Plan:   Post-operative Plan:   Informed Consent: I have reviewed the patients History and Physical, chart, labs and discussed the procedure including the risks, benefits and alternatives for the proposed anesthesia with the patient or authorized representative who has indicated his/her understanding and acceptance.     Dental advisory given  Plan Discussed with: CRNA and Surgeon  Anesthesia Plan Comments:        Anesthesia Quick Evaluation

## 2023-05-31 NOTE — Discharge Instructions (Addendum)
   Take your usually prescribed medications unless otherwise directed.  You should follow a light diet for the remainder of the day after your procedure. Most patients will experience some mild swelling and/or bruising in the area of the incision. It may take several days to resolve. It is common to experience some constipation if taking pain medication after surgery. Increasing fluid intake and taking a stool softener (such as Colace) will usually help or prevent this problem from occurring. A mild laxative (Milk of Magnesia or Miralax) should be taken according to package directions if there are no bowel movements after 48 hours.    These strips should be left on the skin for 10-14 days.  If your surgeon used Dermabond (skin glue) on the incision, you may shower in 24 hours.  The glue will flake off over the next 2-3 weeks.   ACTIVITIES:  return to normal activity as tolerated   WHEN TO CALL YOUR DOCTOR [phone]): Fever over 101.0 Chills Continued bleeding from incision Increased redness and tenderness at the site Shortness of breath, difficulty breathing   The clinic staff is available to answer your questions during regular business hours. Please don't hesitate to call and ask to speak to one of the nurses or medical assistants for clinical concerns. If you have a medical emergency, go to the nearest emergency room or call 911.  A surgeon from [facility] is always on call at the hospital.     For further information, please visit [email or web address]  Post Anesthesia Home Care Instructions  Activity: Get plenty of rest for the remainder of the day. A responsible individual must stay with you for 24 hours following the procedure.  For the next 24 hours, DO NOT: -Drive a car -Advertising copywriter -Drink alcoholic beverages -Take any medication unless instructed by your physician -Make any legal decisions or sign important papers.  Meals: Start with  liquid foods such as gelatin or soup. Progress to regular foods as tolerated. Avoid greasy, spicy, heavy foods. If nausea and/or vomiting occur, drink only clear liquids until the nausea and/or vomiting subsides. Call your physician if vomiting continues.  Special Instructions/Symptoms: Your throat may feel dry or sore from the anesthesia or the breathing tube placed in your throat during surgery. If this causes discomfort, gargle with warm salt water. The discomfort should disappear within 24 hours.  If you had a scopolamine patch placed behind your ear for the management of post- operative nausea and/or vomiting:  1. The medication in the patch is effective for 72 hours, after which it should be removed.  Wrap patch in a tissue and discard in the trash. Wash hands thoroughly with soap and water. 2. You may remove the patch earlier than 72 hours if you experience unpleasant side effects which may include dry mouth, dizziness or visual disturbances. 3. Avoid touching the patch. Wash your hands with soap and water after contact with the patch.      May have Tylenol today after 1:30 PM

## 2023-05-31 NOTE — Transfer of Care (Signed)
Immediate Anesthesia Transfer of Care Note  Patient: Sue Beasley  Procedure(s) Performed: REMOVAL PORT-A-CATH (Chest)  Patient Location: PACU  Anesthesia Type:MAC  Level of Consciousness: awake, alert , and oriented  Airway & Oxygen Therapy: Patient Spontanous Breathing  Post-op Assessment: Report given to RN and Post -op Vital signs reviewed and stable  Post vital signs: Reviewed and stable  Last Vitals:  Vitals Value Taken Time  BP 107/55 05/31/23 0910  Temp    Pulse 79 05/31/23 0911  Resp 24 05/31/23 0911  SpO2 91 % 05/31/23 0911  Vitals shown include unfiled device data.  Last Pain:  Vitals:   05/31/23 0725  TempSrc: Oral  PainSc: 0-No pain      Patients Stated Pain Goal: 3 (05/31/23 0725)  Complications: No notable events documented.

## 2023-05-31 NOTE — Op Note (Signed)
Preoperative diagnosis: Breast cancer, no longer needs venous access Postoperative diagnosis: Same as above Procedure: Port removal Surgical Dr. Dwain Sarna Anesthesia: Local with sedation Specimens: None Estimated balance: Minimal Complications: Drains: Sponge and counts correct completion Disposition recovery stable edition  Indications: This a 52 year old female who is completed chemotherapy and no longer needs venous access.  She desires removal.  Procedure: After informed consent was obtained the patient was taken the operating.  She was placed under monitored anesthesia care.  She was then prepped and draped in a standard sterile surgical fashion.  Surgical timeout was then performed.  I infiltrated Marcaine at the site of her port on her chest.  I then reentered this incision.  I remove the port, line, suture material in their entirety.  Hemostasis was observed.  I closed this with 3-0 Vicryl and 4 Monocryl.  Glue and a Steri-Strip were applied.  She tolerated this well was transferred recovery stable.

## 2023-05-31 NOTE — Anesthesia Postprocedure Evaluation (Signed)
Anesthesia Post Note  Patient: Sue Beasley  Procedure(s) Performed: REMOVAL PORT-A-CATH (Chest)     Patient location during evaluation: PACU Anesthesia Type: MAC Level of consciousness: awake and alert Pain management: pain level controlled Vital Signs Assessment: post-procedure vital signs reviewed and stable Respiratory status: spontaneous breathing, nonlabored ventilation and respiratory function stable Cardiovascular status: blood pressure returned to baseline and stable Postop Assessment: no apparent nausea or vomiting Anesthetic complications: no   No notable events documented.  Last Vitals:  Vitals:   05/31/23 0945 05/31/23 1000  BP: 124/79 123/71  Pulse: 63 (!) 56  Resp: 13 16  Temp:  36.5 C  SpO2: 98% 99%    Last Pain:  Vitals:   05/31/23 1000  TempSrc:   PainSc: 0-No pain                 Lowella Curb

## 2023-05-31 NOTE — H&P (Signed)
Sue Beasley is an 52 y.o. female.   Chief Complaint: breast cancer HPI: 59 yof treated for breast cancer has completed chemotherapy. Doing well.  Ready to remove port.   Past Medical History:  Diagnosis Date   ALLERGIC RHINITIS 11/06/2009   Allergy    Anemia    ANEMIA-IRON DEFICIENCY 11/20/2009   Asthma    Cancer (HCC) 12/2022   left breast IDC   Eczema    Heart murmur    Irreducible umbilical hernia 03/01/2011   Wears glasses     Past Surgical History:  Procedure Laterality Date   BREAST BIOPSY Left 01/14/2023   [place] LT BREAST BX W LOC DEV 1ST LESION IMG BX SPEC US GUIDE 01/14/2023 [facility]   BREAST BIOPSY  02/10/2023   MM LT RADIOACTIVE SEED LOC MAMMO GUIDE 02/10/2023 [facility]   BREAST LUMPECTOMY WITH RADIOACTIVE SEED AND SENTINEL LYMPH NODE BIOPSY Left 02/14/2023   Procedure: LEFT BREAST LUMPECTOMY WITH RADIOACTIVE SEED AND AXILLARY SENTINEL LYMPH NODE BIOPSY;  Surgeon: Emelia Loron, MD;  Location: [facility];  Service: General;  Laterality: Left;   PORTACATH PLACEMENT Right 03/15/2023   Procedure: INSERTION PORT-A-CATH;  Surgeon: Emelia Loron, MD;  Location: [facility] OR;  Service: General;  Laterality: Right;   UMBILICAL HERNIA REPAIR  05/13/11    Family History  Problem Relation Age of Onset   Hyperlipidemia Mother    Diabetes Father    Hypertension Father    Colon polyps Sister    Esophageal cancer Maternal Uncle 86   Pancreatic cancer Cousin 4       mat female cousin   Prostate cancer Cousin 54       mat cousin   Breast cancer Neg Hx    Colon cancer Neg Hx    Stomach cancer Neg Hx    Rectal cancer Neg Hx    Social History:  reports that she has never smoked. She has never used smokeless tobacco. She reports that she does not drink alcohol and does not use drugs.  Allergies:  Allergies  Allergen Reactions   Peanut-Containing Drug Products Anaphylaxis   Soy Allergy Nausea Only   Egg-Derived Products Other (See  Comments)    Upset stomach   Oxycodone Other (See Comments)    Tongue tingling    Amoxicillin Hives and Rash   Amoxicillin-Pot Clavulanate Rash   Shellfish-Derived Products Itching and Rash    Medications Prior to Admission  Medication Sig Dispense Refill   azelastine (ASTELIN) 0.1 % nasal spray Place 1 spray into both nostrils in the morning.  5   Cholecalciferol 50 MCG (2000 UT) TABS 1 tablet Orally Once a day for 30 day(s)     dorzolamide-timolol (COSOPT) 22.3-6.8 MG/ML ophthalmic solution Place 1 drop into both eyes 2 (two) times daily.     ferrous sulfate 325 (65 FE) MG tablet Take 325 mg by mouth 3 (three) times a week.     fluticasone (FLONASE) 50 MCG/ACT nasal spray Place 2 sprays into both nostrils in the morning.  4   fluticasone-salmeterol (WIXELA INHUB) 250-50 MCG/ACT AEPB Inhale 1 puff into the lungs in the morning.     loratadine (CLARITIN) 10 MG tablet Take 10 mg by mouth in the morning.     albuterol (VENTOLIN HFA) 108 (90 Base) MCG/ACT inhaler Inhale 2 puffs into the lungs every 6 (six) hours as needed for shortness of breath or wheezing.     desoximetasone (TOPICORT) 0.25 % cream Apply 1 Application topically 2 (two)  times daily as needed (eczema).     dexamethasone (DECADRON) 4 MG tablet Take 1 tablet (4 mg total) by mouth daily. Take 1 tab day before chemo and 1 tab day after 8 tablet 0   diphenhydrAMINE (BENADRYL) 25 MG tablet Take 25 mg by mouth daily as needed (food allergies (with restaurant eating)).     EPINEPHrine 0.3 mg/0.3 mL IJ SOAJ injection Inject 0.3 mg into the muscle as needed for anaphylaxis.     ketotifen (ZADITOR) 0.035 % ophthalmic solution Place 1-2 drops into both eyes 2 (two) times daily as needed (allergies.).     latanoprost (XALATAN) 0.005 % ophthalmic solution Place 1 drop into both eyes at bedtime.     lidocaine-prilocaine (EMLA) cream Apply to affected area once 30 g 3   norethindrone-ethinyl estradiol 1/35 (ALAYCEN 1/35) tablet Alyacen 1/35      ondansetron (ZOFRAN) 8 MG tablet Take 1 tablet (8 mg total) by mouth every 8 (eight) hours as needed for nausea or vomiting. Start on the third day after chemotherapy. 30 tablet 1   prochlorperazine (COMPAZINE) 10 MG tablet Take 1 tablet (10 mg total) by mouth every 6 (six) hours as needed for nausea or vomiting. 30 tablet 1   traMADol (ULTRAM) 50 MG tablet Take 1 tablet (50 mg total) by mouth every 6 (six) hours as needed. 10 tablet 0    Results for orders placed or performed during the hospital encounter of 05/31/23 (from the past 48 hour(s))  Pregnancy, urine POC     Status: None   Collection Time: 05/31/23  7:14 AM  Result Value Ref Range   Preg Test, Ur NEGATIVE NEGATIVE    Comment:        THE SENSITIVITY OF THIS METHODOLOGY IS >24 mIU/mL    No results found.  Review of Systems  All other systems reviewed and are negative.   Blood pressure 138/76, pulse 90, temperature (!) 97.3 F (36.3 C), temperature source Oral, resp. rate 12, height 5\' 1"  (1.549 m), weight 97.4 kg, SpO2 100%. Physical Exam  General nad Cv regular  Pulm effort normal Right chest port  Assessment/Plan Breast cancer, no longer needs venous access Port removal  Emelia Loron, MD 05/31/2023, 8:24 AM

## 2023-06-01 ENCOUNTER — Encounter (HOSPITAL_BASED_OUTPATIENT_CLINIC_OR_DEPARTMENT_OTHER): Payer: Self-pay | Admitting: General Surgery

## 2023-06-05 NOTE — Progress Notes (Signed)
Radiation Oncology         ([phone] ________________________________  Name: Sue Beasley MRN: [id number]  Date: 06/06/2023  DOB: April 23, 1971  Re-Evaluation Note  CC: [name], MD  Serena Croissant, MD  No diagnosis found.  Diagnosis: Stage IA (pT1c, pN0, cM0) Left Breast UOQ, Invasive Ductal Carcinoma, ER+ / PR+ / Her2-, Grade 3: s/p lumpectomy with left axillary SLN excisions followed by adjuvant chemotherapy   Narrative:  The patient returns today to discuss radiation treatment options. She was seen in the multidisciplinary breast clinic on 01/26/23.   She underwent genetic testing on her consultation date. Results showed no clinically significant variants detected by BRCAplus or +RNAinsight testing.  Since her consultation date, she opted to proceed with a left breast lumpectomy with left axillary sentinel lymph node biopsies on 02/14/23 under the care of Dr. Dwain Sarna. Pathology from the procedure revealed: tumor the size of 1.9 cm; histology of grade 3 invasive ductal carcinoma; all margins negative for invasive carcinoma; nodal status of 8/8 left axillary SLN excisions negative for carcinoma. Prognostic indicators significant for: estrogen receptor 100% positive and progesterone receptor 3% positive, both with strong staining intensity; Proliferation marker Ki67 at 70%; Her2 status negative; Grade 3.   Oncotype DX was obtained on the final surgical sample and the recurrence score of 36 predicted a risk of recurrence outside the breast over the next 9 years of 24%, if the patient's only systemic therapy were to be an antiestrogen for 5 years. It also predicted a significant benefit from chemotherapy (>15% benefit).  She has been treated with adjuvant chemotherapy consisting of Taxotere and Cytoxan q 3 weeks x 4 cycles from 03/16/23 through 05/18/23 under the care of Dr. Pamelia Hoit. Chemo toxicities encountered by the patient throughout the course of systemic treatment included:  fatigue, bone pain, neutropenic fever (resolved), alopecia, nail dyscrasia, weight gain, and mild intermittent tingling to the fingers on her right hand.   She will return to Dr. Pamelia Hoit following XRT to discuss antiestrogen treatment options.   On review of systems, the patient reports ***. She denies *** and any other symptoms.    Allergies:  is allergic to peanut-containing drug products, soy allergy, egg-derived products, oxycodone, amoxicillin, amoxicillin-pot clavulanate, and shellfish-derived products.  Meds: Current Outpatient Medications  Medication Sig Dispense Refill   albuterol (VENTOLIN HFA) 108 (90 Base) MCG/ACT inhaler Inhale 2 puffs into the lungs every 6 (six) hours as needed for shortness of breath or wheezing.     azelastine (ASTELIN) 0.1 % nasal spray Place 1 spray into both nostrils in the morning.  5   Cholecalciferol 50 MCG (2000 UT) TABS 1 tablet Orally Once a day for 30 day(s)     desoximetasone (TOPICORT) 0.25 % cream Apply 1 Application topically 2 (two) times daily as needed (eczema).     dexamethasone (DECADRON) 4 MG tablet Take 1 tablet (4 mg total) by mouth daily. Take 1 tab day before chemo and 1 tab day after 8 tablet 0   diphenhydrAMINE (BENADRYL) 25 MG tablet Take 25 mg by mouth daily as needed (food allergies (with restaurant eating)).     dorzolamide-timolol (COSOPT) 22.3-6.8 MG/ML ophthalmic solution Place 1 drop into both eyes 2 (two) times daily.     EPINEPHrine 0.3 mg/0.3 mL IJ SOAJ injection Inject 0.3 mg into the muscle as needed for anaphylaxis.     ferrous sulfate 325 (65 FE) MG tablet Take 325 mg by mouth 3 (three) times a week.  fluticasone (FLONASE) 50 MCG/ACT nasal spray Place 2 sprays into both nostrils in the morning.  4   fluticasone-salmeterol (WIXELA INHUB) 250-50 MCG/ACT AEPB Inhale 1 puff into the lungs in the morning.     ketotifen (ZADITOR) 0.035 % ophthalmic solution Place 1-2 drops into both eyes 2 (two) times daily as needed  (allergies.).     latanoprost (XALATAN) 0.005 % ophthalmic solution Place 1 drop into both eyes at bedtime.     lidocaine-prilocaine (EMLA) cream Apply to affected area once 30 g 3   loratadine (CLARITIN) 10 MG tablet Take 10 mg by mouth in the morning.     norethindrone-ethinyl estradiol 1/35 (ALAYCEN 1/35) tablet Alyacen 1/35     ondansetron (ZOFRAN) 8 MG tablet Take 1 tablet (8 mg total) by mouth every 8 (eight) hours as needed for nausea or vomiting. Start on the third day after chemotherapy. 30 tablet 1   prochlorperazine (COMPAZINE) 10 MG tablet Take 1 tablet (10 mg total) by mouth every 6 (six) hours as needed for nausea or vomiting. 30 tablet 1   traMADol (ULTRAM) 50 MG tablet Take 1 tablet (50 mg total) by mouth every 6 (six) hours as needed. 10 tablet 0   No current facility-administered medications for this encounter.    Physical Findings: The patient is in no acute distress. Patient is alert and oriented.  vitals were not taken for this visit.  No significant changes. Lungs are clear to auscultation bilaterally. Heart has regular rate and rhythm. No palpable cervical, supraclavicular, or axillary adenopathy. Abdomen soft, non-tender, normal bowel sounds. Right Breast: no palpable mass, nipple discharge or bleeding. Left Breast: ***  Lab Findings: Lab Results  Component Value Date   WBC 7.9 05/18/2023   HGB 10.8 (L) 05/18/2023   HCT 34.1 (L) 05/18/2023   MCV 69.3 (L) 05/18/2023   PLT 303 05/18/2023    Radiographic Findings: No results found.  Impression: Stage IA (pT1c, pN0, cM0) Left Breast UOQ, Invasive Ductal Carcinoma, ER+ / PR+ / Her2-, Grade 3: s/p lumpectomy with left axillary SLN excisions followed by adjuvant chemotherapy   ***  Plan:  Patient is scheduled for CT simulation {date/later today}. ***  -----------------------------------  Billie Lade, PhD, MD  This document serves as a record of services personally performed by Antony Blackbird, MD. It was  created on his behalf by Neena Rhymes, a trained medical scribe. The creation of this record is based on the scribe's personal observations and the provider's statements to them. This document has been checked and approved by the attending provider.

## 2023-06-06 ENCOUNTER — Ambulatory Visit
Admission: RE | Admit: 2023-06-06 | Discharge: 2023-06-06 | Disposition: A | Discharge status: Home / Self Care | Payer: BC Managed Care – PPO | Source: Ambulatory Visit | Attending: Radiation Oncology | Admitting: Radiation Oncology

## 2023-06-06 ENCOUNTER — Other Ambulatory Visit: Payer: Self-pay

## 2023-06-06 ENCOUNTER — Encounter: Payer: Self-pay | Admitting: Radiation Oncology

## 2023-06-06 VITALS — BP 157/85 | HR 88 | Temp 97.6°F | Resp 20 | Ht 61.0 in | Wt 218.2 lb

## 2023-06-06 DIAGNOSIS — C50412 Malignant neoplasm of upper-outer quadrant of left female breast: Secondary | ICD-10-CM | POA: Insufficient documentation

## 2023-06-06 DIAGNOSIS — Z510 Encounter for antineoplastic radiation therapy: Secondary | ICD-10-CM | POA: Insufficient documentation

## 2023-06-06 DIAGNOSIS — Z7951 Long term (current) use of inhaled steroids: Secondary | ICD-10-CM | POA: Insufficient documentation

## 2023-06-06 DIAGNOSIS — Z170 Estrogen receptor positive status [ER+]: Secondary | ICD-10-CM

## 2023-06-06 DIAGNOSIS — Z79899 Other long term (current) drug therapy: Secondary | ICD-10-CM | POA: Diagnosis not present

## 2023-06-14 ENCOUNTER — Encounter: Payer: Self-pay | Admitting: *Deleted

## 2023-06-14 DIAGNOSIS — C50412 Malignant neoplasm of upper-outer quadrant of left female breast: Secondary | ICD-10-CM

## 2023-06-16 ENCOUNTER — Telehealth: Payer: Self-pay | Admitting: Hematology and Oncology

## 2023-06-16 NOTE — Telephone Encounter (Signed)
Left patient a message in regards to scheduled appointment times/dates for follow up with provider

## 2023-06-17 ENCOUNTER — Telehealth: Payer: Self-pay

## 2023-06-17 NOTE — Telephone Encounter (Signed)
Pt concerns of tingling in feet following chemotherapy tx completion. This RN educated pt on the effects of chemotherapy administration and that tingling in hands/feet and persist after completion of therapy and typically improves. Pt verbalized understanding. This RN also reminded her to please call back if she feels like it is worsening, develops new symptoms, or feels like the neuropathy is effecting her to the point where it is bothersome and we can offer to schedule an appt. Pt stated understanding.

## 2023-06-21 DIAGNOSIS — Z510 Encounter for antineoplastic radiation therapy: Secondary | ICD-10-CM | POA: Diagnosis not present

## 2023-06-22 ENCOUNTER — Ambulatory Visit: Payer: BC Managed Care – PPO | Admitting: Radiation Oncology

## 2023-06-23 ENCOUNTER — Other Ambulatory Visit: Payer: Self-pay

## 2023-06-23 ENCOUNTER — Ambulatory Visit: Payer: BC Managed Care – PPO

## 2023-06-23 ENCOUNTER — Ambulatory Visit
Admission: RE | Admit: 2023-06-23 | Discharge: 2023-06-23 | Disposition: A | Discharge status: Home / Self Care | Payer: BC Managed Care – PPO | Source: Ambulatory Visit | Attending: Radiation Oncology | Admitting: Radiation Oncology

## 2023-06-23 DIAGNOSIS — Z510 Encounter for antineoplastic radiation therapy: Secondary | ICD-10-CM | POA: Diagnosis not present

## 2023-06-23 DIAGNOSIS — Z170 Estrogen receptor positive status [ER+]: Secondary | ICD-10-CM

## 2023-06-23 LAB — RAD ONC ARIA SESSION SUMMARY
Course Elapsed Days: 0
Plan Fractions Treated to Date: 1
Plan Prescribed Dose Per Fraction: 2.67 Gy
Plan Total Fractions Prescribed: 15
Plan Total Prescribed Dose: 40.05 Gy
Reference Point Dosage Given to Date: 2.67 Gy
Reference Point Session Dosage Given: 2.67 Gy
Session Number: 1

## 2023-06-24 ENCOUNTER — Encounter: Payer: Self-pay | Admitting: General Practice

## 2023-06-24 ENCOUNTER — Other Ambulatory Visit: Payer: Self-pay

## 2023-06-24 ENCOUNTER — Ambulatory Visit
Admission: RE | Admit: 2023-06-24 | Discharge: 2023-06-24 | Disposition: A | Discharge status: Home / Self Care | Payer: BC Managed Care – PPO | Source: Ambulatory Visit | Attending: Radiation Oncology | Admitting: Radiation Oncology

## 2023-06-24 DIAGNOSIS — Z510 Encounter for antineoplastic radiation therapy: Secondary | ICD-10-CM | POA: Diagnosis not present

## 2023-06-24 LAB — RAD ONC ARIA SESSION SUMMARY
Course Elapsed Days: 1
Plan Fractions Treated to Date: 2
Plan Prescribed Dose Per Fraction: 2.67 Gy
Plan Total Fractions Prescribed: 15
Plan Total Prescribed Dose: 40.05 Gy
Reference Point Dosage Given to Date: 5.34 Gy
Reference Point Session Dosage Given: 2.67 Gy
Session Number: 2

## 2023-06-24 NOTE — Progress Notes (Signed)
[facility] Spiritual Care Note  Missed a casual [facility] Patient and [facility] visit from [place], so left voicemail of support, encouragement, and availability to facilitate a future visit.   [address] [place], [place], [facility] Pager [phone] Voicemail [phone]

## 2023-06-28 ENCOUNTER — Ambulatory Visit
Admission: RE | Admit: 2023-06-28 | Discharge: 2023-06-28 | Disposition: A | Discharge status: Home / Self Care | Payer: BC Managed Care – PPO | Source: Ambulatory Visit | Attending: Radiation Oncology | Admitting: Radiation Oncology

## 2023-06-28 ENCOUNTER — Other Ambulatory Visit: Payer: Self-pay

## 2023-06-28 DIAGNOSIS — Z510 Encounter for antineoplastic radiation therapy: Secondary | ICD-10-CM | POA: Insufficient documentation

## 2023-06-28 DIAGNOSIS — C50412 Malignant neoplasm of upper-outer quadrant of left female breast: Secondary | ICD-10-CM | POA: Insufficient documentation

## 2023-06-28 LAB — RAD ONC ARIA SESSION SUMMARY
Course Elapsed Days: 5
Plan Fractions Treated to Date: 3
Plan Prescribed Dose Per Fraction: 2.67 Gy
Plan Total Fractions Prescribed: 15
Plan Total Prescribed Dose: 40.05 Gy
Reference Point Dosage Given to Date: 8.01 Gy
Reference Point Session Dosage Given: 2.67 Gy
Session Number: 3

## 2023-06-29 ENCOUNTER — Ambulatory Visit
Admission: RE | Admit: 2023-06-29 | Discharge: 2023-06-29 | Disposition: A | Discharge status: Home / Self Care | Payer: BC Managed Care – PPO | Source: Ambulatory Visit | Attending: Radiation Oncology | Admitting: Radiation Oncology

## 2023-06-29 ENCOUNTER — Other Ambulatory Visit: Payer: Self-pay

## 2023-06-29 DIAGNOSIS — Z510 Encounter for antineoplastic radiation therapy: Secondary | ICD-10-CM | POA: Diagnosis not present

## 2023-06-29 LAB — RAD ONC ARIA SESSION SUMMARY
Course Elapsed Days: 6
Plan Fractions Treated to Date: 4
Plan Prescribed Dose Per Fraction: 2.67 Gy
Plan Total Fractions Prescribed: 15
Plan Total Prescribed Dose: 40.05 Gy
Reference Point Dosage Given to Date: 10.68 Gy
Reference Point Session Dosage Given: 2.67 Gy
Session Number: 4

## 2023-06-30 ENCOUNTER — Ambulatory Visit
Admission: RE | Admit: 2023-06-30 | Discharge: 2023-06-30 | Disposition: A | Discharge status: Home / Self Care | Payer: BC Managed Care – PPO | Source: Ambulatory Visit | Attending: Radiation Oncology | Admitting: Radiation Oncology

## 2023-06-30 ENCOUNTER — Other Ambulatory Visit: Payer: Self-pay

## 2023-06-30 DIAGNOSIS — Z510 Encounter for antineoplastic radiation therapy: Secondary | ICD-10-CM | POA: Diagnosis not present

## 2023-06-30 LAB — RAD ONC ARIA SESSION SUMMARY
Course Elapsed Days: 7
Plan Fractions Treated to Date: 5
Plan Prescribed Dose Per Fraction: 2.67 Gy
Plan Total Fractions Prescribed: 15
Plan Total Prescribed Dose: 40.05 Gy
Reference Point Dosage Given to Date: 13.35 Gy
Reference Point Session Dosage Given: 2.67 Gy
Session Number: 5

## 2023-07-01 ENCOUNTER — Encounter: Payer: Self-pay | Admitting: Radiation Oncology

## 2023-07-01 ENCOUNTER — Telehealth: Payer: Self-pay

## 2023-07-01 ENCOUNTER — Ambulatory Visit
Admission: RE | Admit: 2023-07-01 | Discharge: 2023-07-01 | Disposition: A | Discharge status: Home / Self Care | Payer: BC Managed Care – PPO | Source: Ambulatory Visit | Attending: Radiation Oncology | Admitting: Radiation Oncology

## 2023-07-01 ENCOUNTER — Other Ambulatory Visit: Payer: Self-pay

## 2023-07-01 DIAGNOSIS — Z510 Encounter for antineoplastic radiation therapy: Secondary | ICD-10-CM | POA: Diagnosis not present

## 2023-07-01 LAB — RAD ONC ARIA SESSION SUMMARY
Course Elapsed Days: 8
Plan Fractions Treated to Date: 6
Plan Prescribed Dose Per Fraction: 2.67 Gy
Plan Total Fractions Prescribed: 15
Plan Total Prescribed Dose: 40.05 Gy
Reference Point Dosage Given to Date: 16.02 Gy
Reference Point Session Dosage Given: 2.67 Gy
Session Number: 6

## 2023-07-01 NOTE — Progress Notes (Signed)
I received a call about this patient as the on-call physician early this morning.  The patient has completed chemotherapy and has begun adjuvant radiation treatment for her breast cancer.  She states that she was running a fever with a temperature of 100.7.  Her last labs through medical oncology look fairly good without any evidence of neutropenia and completed her chemotherapy back in July.  The plan that was discussed last night is for her to be checked on this morning by our clinic and likely a COVID test would be helpful to see what is going on.  Certainly if she worsens then workup in the emergency room may be reasonable.  ------------------------------------------------  Radene Gunning, MD, PhD

## 2023-07-01 NOTE — Telephone Encounter (Signed)
Patient called in to report having temp of 100.7 last night. Temp is now 99.0 without any medications. Patient denies any other symptoms. Patient to take home covid test and call back with results.

## 2023-07-01 NOTE — Telephone Encounter (Signed)
Patient called in to report negative covid test. Patient continues to have fever. Currently 100.0 Encouraged patient to take a fever reducer. Patient voiced understanding. Continues to deny any other symptoms. Patient to come in to have radiation treatment today.

## 2023-07-04 ENCOUNTER — Ambulatory Visit
Admission: RE | Admit: 2023-07-04 | Discharge: 2023-07-04 | Disposition: A | Discharge status: Home / Self Care | Payer: BC Managed Care – PPO | Source: Ambulatory Visit | Attending: Radiation Oncology | Admitting: Radiation Oncology

## 2023-07-04 ENCOUNTER — Other Ambulatory Visit: Payer: Self-pay

## 2023-07-04 DIAGNOSIS — Z510 Encounter for antineoplastic radiation therapy: Secondary | ICD-10-CM | POA: Diagnosis not present

## 2023-07-04 LAB — RAD ONC ARIA SESSION SUMMARY
Course Elapsed Days: 11
Plan Fractions Treated to Date: 7
Plan Prescribed Dose Per Fraction: 2.67 Gy
Plan Total Fractions Prescribed: 15
Plan Total Prescribed Dose: 40.05 Gy
Reference Point Dosage Given to Date: 18.69 Gy
Reference Point Session Dosage Given: 2.67 Gy
Session Number: 7

## 2023-07-05 ENCOUNTER — Ambulatory Visit
Admission: RE | Admit: 2023-07-05 | Discharge: 2023-07-05 | Disposition: A | Discharge status: Home / Self Care | Payer: BC Managed Care – PPO | Source: Ambulatory Visit | Attending: Radiation Oncology | Admitting: Radiation Oncology

## 2023-07-05 ENCOUNTER — Other Ambulatory Visit: Payer: Self-pay

## 2023-07-05 DIAGNOSIS — Z510 Encounter for antineoplastic radiation therapy: Secondary | ICD-10-CM | POA: Diagnosis not present

## 2023-07-05 LAB — RAD ONC ARIA SESSION SUMMARY
Course Elapsed Days: 12
Plan Fractions Treated to Date: 8
Plan Prescribed Dose Per Fraction: 2.67 Gy
Plan Total Fractions Prescribed: 15
Plan Total Prescribed Dose: 40.05 Gy
Reference Point Dosage Given to Date: 21.36 Gy
Reference Point Session Dosage Given: 2.67 Gy
Session Number: 8

## 2023-07-06 ENCOUNTER — Ambulatory Visit
Admission: RE | Admit: 2023-07-06 | Discharge: 2023-07-06 | Disposition: A | Discharge status: Home / Self Care | Payer: BC Managed Care – PPO | Source: Ambulatory Visit | Attending: Radiation Oncology | Admitting: Radiation Oncology

## 2023-07-06 ENCOUNTER — Other Ambulatory Visit: Payer: Self-pay

## 2023-07-06 DIAGNOSIS — Z510 Encounter for antineoplastic radiation therapy: Secondary | ICD-10-CM | POA: Diagnosis not present

## 2023-07-06 LAB — RAD ONC ARIA SESSION SUMMARY
Course Elapsed Days: 13
Plan Fractions Treated to Date: 9
Plan Prescribed Dose Per Fraction: 2.67 Gy
Plan Total Fractions Prescribed: 15
Plan Total Prescribed Dose: 40.05 Gy
Reference Point Dosage Given to Date: 24.03 Gy
Reference Point Session Dosage Given: 2.67 Gy
Session Number: 9

## 2023-07-07 ENCOUNTER — Other Ambulatory Visit: Payer: Self-pay

## 2023-07-07 ENCOUNTER — Ambulatory Visit
Admission: RE | Admit: 2023-07-07 | Discharge: 2023-07-07 | Disposition: A | Discharge status: Home / Self Care | Payer: BC Managed Care – PPO | Source: Ambulatory Visit | Attending: Radiation Oncology | Admitting: Radiation Oncology

## 2023-07-07 DIAGNOSIS — Z510 Encounter for antineoplastic radiation therapy: Secondary | ICD-10-CM | POA: Diagnosis not present

## 2023-07-07 LAB — RAD ONC ARIA SESSION SUMMARY
Course Elapsed Days: 14
Plan Fractions Treated to Date: 10
Plan Prescribed Dose Per Fraction: 2.67 Gy
Plan Total Fractions Prescribed: 15
Plan Total Prescribed Dose: 40.05 Gy
Reference Point Dosage Given to Date: 26.7 Gy
Reference Point Session Dosage Given: 2.67 Gy
Session Number: 10

## 2023-07-08 ENCOUNTER — Other Ambulatory Visit: Payer: Self-pay

## 2023-07-08 ENCOUNTER — Ambulatory Visit
Admission: RE | Admit: 2023-07-08 | Discharge: 2023-07-08 | Disposition: A | Discharge status: Home / Self Care | Payer: BC Managed Care – PPO | Source: Ambulatory Visit | Attending: Radiation Oncology | Admitting: Radiation Oncology

## 2023-07-08 DIAGNOSIS — Z510 Encounter for antineoplastic radiation therapy: Secondary | ICD-10-CM | POA: Diagnosis not present

## 2023-07-08 LAB — RAD ONC ARIA SESSION SUMMARY
Course Elapsed Days: 15
Plan Fractions Treated to Date: 11
Plan Prescribed Dose Per Fraction: 2.67 Gy
Plan Total Fractions Prescribed: 15
Plan Total Prescribed Dose: 40.05 Gy
Reference Point Dosage Given to Date: 29.37 Gy
Reference Point Session Dosage Given: 2.67 Gy
Session Number: 11

## 2023-07-11 ENCOUNTER — Other Ambulatory Visit: Payer: Self-pay

## 2023-07-11 ENCOUNTER — Ambulatory Visit
Admission: RE | Admit: 2023-07-11 | Discharge: 2023-07-11 | Disposition: A | Discharge status: Home / Self Care | Payer: BC Managed Care – PPO | Source: Ambulatory Visit | Attending: Radiation Oncology | Admitting: Radiation Oncology

## 2023-07-11 DIAGNOSIS — Z510 Encounter for antineoplastic radiation therapy: Secondary | ICD-10-CM | POA: Diagnosis not present

## 2023-07-11 LAB — RAD ONC ARIA SESSION SUMMARY
Course Elapsed Days: 18
Plan Fractions Treated to Date: 12
Plan Prescribed Dose Per Fraction: 2.67 Gy
Plan Total Fractions Prescribed: 15
Plan Total Prescribed Dose: 40.05 Gy
Reference Point Dosage Given to Date: 32.04 Gy
Reference Point Session Dosage Given: 2.67 Gy
Session Number: 12

## 2023-07-12 ENCOUNTER — Ambulatory Visit: Payer: BC Managed Care – PPO | Admitting: Radiation Oncology

## 2023-07-12 ENCOUNTER — Ambulatory Visit
Admission: RE | Admit: 2023-07-12 | Discharge: 2023-07-12 | Disposition: A | Discharge status: Home / Self Care | Payer: BC Managed Care – PPO | Source: Ambulatory Visit | Attending: Radiation Oncology | Admitting: Radiation Oncology

## 2023-07-12 ENCOUNTER — Other Ambulatory Visit: Payer: Self-pay

## 2023-07-12 DIAGNOSIS — Z510 Encounter for antineoplastic radiation therapy: Secondary | ICD-10-CM | POA: Diagnosis not present

## 2023-07-12 LAB — RAD ONC ARIA SESSION SUMMARY
Course Elapsed Days: 19
Plan Fractions Treated to Date: 13
Plan Prescribed Dose Per Fraction: 2.67 Gy
Plan Total Fractions Prescribed: 15
Plan Total Prescribed Dose: 40.05 Gy
Reference Point Dosage Given to Date: 34.71 Gy
Reference Point Session Dosage Given: 2.67 Gy
Session Number: 13

## 2023-07-13 ENCOUNTER — Ambulatory Visit
Admission: RE | Admit: 2023-07-13 | Discharge: 2023-07-13 | Disposition: A | Discharge status: Home / Self Care | Payer: BC Managed Care – PPO | Source: Ambulatory Visit | Attending: Radiation Oncology | Admitting: Radiation Oncology

## 2023-07-13 ENCOUNTER — Other Ambulatory Visit: Payer: Self-pay

## 2023-07-13 ENCOUNTER — Ambulatory Visit: Payer: BC Managed Care – PPO

## 2023-07-13 DIAGNOSIS — Z510 Encounter for antineoplastic radiation therapy: Secondary | ICD-10-CM | POA: Diagnosis not present

## 2023-07-13 LAB — RAD ONC ARIA SESSION SUMMARY
Course Elapsed Days: 20
Plan Fractions Treated to Date: 14
Plan Prescribed Dose Per Fraction: 2.67 Gy
Plan Total Fractions Prescribed: 15
Plan Total Prescribed Dose: 40.05 Gy
Reference Point Dosage Given to Date: 37.38 Gy
Reference Point Session Dosage Given: 2.67 Gy
Session Number: 14

## 2023-07-14 ENCOUNTER — Ambulatory Visit
Admission: RE | Admit: 2023-07-14 | Discharge: 2023-07-14 | Disposition: A | Discharge status: Home / Self Care | Payer: BC Managed Care – PPO | Source: Ambulatory Visit | Attending: Radiation Oncology | Admitting: Radiation Oncology

## 2023-07-14 ENCOUNTER — Other Ambulatory Visit: Payer: Self-pay

## 2023-07-14 ENCOUNTER — Telehealth: Payer: Self-pay | Admitting: *Deleted

## 2023-07-14 DIAGNOSIS — Z510 Encounter for antineoplastic radiation therapy: Secondary | ICD-10-CM | POA: Diagnosis not present

## 2023-07-14 LAB — RAD ONC ARIA SESSION SUMMARY
Course Elapsed Days: 21
Plan Fractions Treated to Date: 15
Plan Prescribed Dose Per Fraction: 2.67 Gy
Plan Total Fractions Prescribed: 15
Plan Total Prescribed Dose: 40.05 Gy
Reference Point Dosage Given to Date: 40.05 Gy
Reference Point Session Dosage Given: 2.67 Gy
Session Number: 15

## 2023-07-15 ENCOUNTER — Ambulatory Visit
Admission: RE | Admit: 2023-07-15 | Discharge: 2023-07-15 | Disposition: A | Discharge status: Home / Self Care | Payer: BC Managed Care – PPO | Source: Ambulatory Visit | Attending: Radiation Oncology | Admitting: Radiation Oncology

## 2023-07-15 ENCOUNTER — Other Ambulatory Visit: Payer: Self-pay

## 2023-07-15 DIAGNOSIS — Z510 Encounter for antineoplastic radiation therapy: Secondary | ICD-10-CM | POA: Diagnosis not present

## 2023-07-15 LAB — RAD ONC ARIA SESSION SUMMARY
Course Elapsed Days: 22
Plan Fractions Treated to Date: 1
Plan Prescribed Dose Per Fraction: 2 Gy
Plan Total Fractions Prescribed: 5
Plan Total Prescribed Dose: 10 Gy
Reference Point Dosage Given to Date: 2 Gy
Reference Point Session Dosage Given: 2 Gy
Session Number: 16

## 2023-07-18 ENCOUNTER — Other Ambulatory Visit: Payer: Self-pay

## 2023-07-18 ENCOUNTER — Ambulatory Visit
Admission: RE | Admit: 2023-07-18 | Discharge: 2023-07-18 | Disposition: A | Discharge status: Home / Self Care | Payer: BC Managed Care – PPO | Source: Ambulatory Visit | Attending: Radiation Oncology | Admitting: Radiation Oncology

## 2023-07-18 DIAGNOSIS — Z510 Encounter for antineoplastic radiation therapy: Secondary | ICD-10-CM | POA: Diagnosis not present

## 2023-07-18 LAB — RAD ONC ARIA SESSION SUMMARY
Course Elapsed Days: 25
Plan Fractions Treated to Date: 2
Plan Prescribed Dose Per Fraction: 2 Gy
Plan Total Fractions Prescribed: 5
Plan Total Prescribed Dose: 10 Gy
Reference Point Dosage Given to Date: 4 Gy
Reference Point Session Dosage Given: 2 Gy
Session Number: 17

## 2023-07-19 ENCOUNTER — Other Ambulatory Visit: Payer: Self-pay

## 2023-07-19 ENCOUNTER — Ambulatory Visit
Admission: RE | Admit: 2023-07-19 | Discharge: 2023-07-19 | Disposition: A | Discharge status: Home / Self Care | Payer: BC Managed Care – PPO | Source: Ambulatory Visit | Attending: Radiation Oncology | Admitting: Radiation Oncology

## 2023-07-19 ENCOUNTER — Ambulatory Visit: Payer: BC Managed Care – PPO

## 2023-07-19 DIAGNOSIS — Z510 Encounter for antineoplastic radiation therapy: Secondary | ICD-10-CM | POA: Diagnosis not present

## 2023-07-19 LAB — RAD ONC ARIA SESSION SUMMARY
Course Elapsed Days: 26
Plan Fractions Treated to Date: 3
Plan Prescribed Dose Per Fraction: 2 Gy
Plan Total Fractions Prescribed: 5
Plan Total Prescribed Dose: 10 Gy
Reference Point Dosage Given to Date: 6 Gy
Reference Point Session Dosage Given: 2 Gy
Session Number: 18

## 2023-07-20 ENCOUNTER — Ambulatory Visit: Payer: BC Managed Care – PPO

## 2023-07-21 ENCOUNTER — Ambulatory Visit: Payer: BC Managed Care – PPO

## 2023-07-21 ENCOUNTER — Ambulatory Visit
Admission: RE | Admit: 2023-07-21 | Discharge: 2023-07-21 | Disposition: A | Discharge status: Home / Self Care | Payer: BC Managed Care – PPO | Source: Ambulatory Visit | Attending: Radiation Oncology | Admitting: Radiation Oncology

## 2023-07-21 ENCOUNTER — Other Ambulatory Visit: Payer: Self-pay

## 2023-07-21 DIAGNOSIS — Z510 Encounter for antineoplastic radiation therapy: Secondary | ICD-10-CM | POA: Diagnosis not present

## 2023-07-21 LAB — RAD ONC ARIA SESSION SUMMARY
Course Elapsed Days: 28
Plan Fractions Treated to Date: 4
Plan Prescribed Dose Per Fraction: 2 Gy
Plan Total Fractions Prescribed: 5
Plan Total Prescribed Dose: 10 Gy
Reference Point Dosage Given to Date: 8 Gy
Reference Point Session Dosage Given: 2 Gy
Session Number: 19

## 2023-07-22 ENCOUNTER — Ambulatory Visit
Admission: RE | Admit: 2023-07-22 | Discharge: 2023-07-22 | Disposition: A | Discharge status: Home / Self Care | Payer: BC Managed Care – PPO | Source: Ambulatory Visit | Attending: Radiation Oncology | Admitting: Radiation Oncology

## 2023-07-22 ENCOUNTER — Other Ambulatory Visit: Payer: Self-pay

## 2023-07-22 DIAGNOSIS — Z510 Encounter for antineoplastic radiation therapy: Secondary | ICD-10-CM | POA: Diagnosis not present

## 2023-07-22 LAB — RAD ONC ARIA SESSION SUMMARY
Course Elapsed Days: 29
Plan Fractions Treated to Date: 5
Plan Prescribed Dose Per Fraction: 2 Gy
Plan Total Fractions Prescribed: 5
Plan Total Prescribed Dose: 10 Gy
Reference Point Dosage Given to Date: 10 Gy
Reference Point Session Dosage Given: 2 Gy
Session Number: 20

## 2023-07-25 NOTE — Radiation Completion Notes (Signed)
Patient Name: Sue Beasley, STANNARD MRN: [id number] Date of Birth: 10-31-1970 Referring Physician: Serena Croissant, M.D. Date of Service: 2023-07-25 Radiation Oncologist: Arnette Schaumann, M.D. [facility] [place]                             RADIATION ONCOLOGY END OF TREATMENT NOTE     Diagnosis: C50.412 Malignant neoplasm of upper-outer quadrant of left female breast Staging on 2023-03-01: Malignant neoplasm of upper-outer quadrant of left breast in female, estrogen receptor positive (HCC) T=pT1c, N=pN0, M=cM0 Staging on 2023-01-26: Malignant neoplasm of upper-outer quadrant of left breast in female, estrogen receptor positive (HCC) T=cT1c, N=cN0, M=cM0 Intent: Curative     ==========DELIVERED PLANS==========  First Treatment Date: 2023-06-23 - Last Treatment Date: 2023-07-22   Plan Name: Breast_L_BH Site: Breast, Left Technique: 3D Mode: Photon Dose Per Fraction: 2.67 Gy Prescribed Dose (Delivered / Prescribed): 40.05 Gy / 40.05 Gy Prescribed Fxs (Delivered / Prescribed): 15 / 15   Plan Name: Brst_L_Bst_BH Site: Breast, Left Technique: 3D Mode: Photon Dose Per Fraction: 2 Gy Prescribed Dose (Delivered / Prescribed): 10 Gy / 10 Gy Prescribed Fxs (Delivered / Prescribed): 5 / 5     ==========ON TREATMENT VISIT DATES========== 2023-06-28, 2023-07-05, 2023-07-12, 2023-07-19     ==========UPCOMING VISITS==========       ==========APPENDIX - ON TREATMENT VISIT NOTES==========   See weekly On Treatment Notes in Epic for details.

## 2023-07-26 ENCOUNTER — Inpatient Hospital Stay: Payer: BC Managed Care – PPO

## 2023-07-26 ENCOUNTER — Inpatient Hospital Stay: Payer: BC Managed Care – PPO | Attending: Hematology and Oncology | Admitting: Hematology and Oncology

## 2023-07-26 VITALS — BP 140/71 | HR 70 | Temp 97.9°F | Resp 18 | Ht 61.0 in | Wt 214.8 lb

## 2023-07-26 DIAGNOSIS — Z923 Personal history of irradiation: Secondary | ICD-10-CM | POA: Insufficient documentation

## 2023-07-26 DIAGNOSIS — C50412 Malignant neoplasm of upper-outer quadrant of left female breast: Secondary | ICD-10-CM | POA: Insufficient documentation

## 2023-07-26 DIAGNOSIS — Z170 Estrogen receptor positive status [ER+]: Secondary | ICD-10-CM

## 2023-07-26 MED ORDER — TAMOXIFEN CITRATE 20 MG PO TABS: 20.0000 mg | ORAL_TABLET | Freq: Every day | ORAL | 3 refills | Status: DC

## 2023-07-27 ENCOUNTER — Encounter: Payer: Self-pay | Admitting: Hematology and Oncology

## 2023-07-27 ENCOUNTER — Telehealth: Payer: Self-pay | Admitting: *Deleted

## 2023-07-27 LAB — FOLLICLE STIMULATING HORMONE: FSH: 45.3 m[IU]/mL

## 2023-07-27 NOTE — Assessment & Plan Note (Signed)
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 Oncotype DX recurrence score: 36 (distant recurrence at 9 years: 24%)   Recommendation: Adjuvant chemotherapy with Taxotere and Cytoxan every 3 weeks x 4 completed 05/18/2023 Adjuvant radiation therapy completed 07/22/2023 Adjuvant antiestrogen therapy started 08/09/2023 with tamoxifen (patient had menstrual cycles until chemo)

## 2023-07-27 NOTE — Progress Notes (Signed)
Patient Care Team: [name], MD as PCP - General Serena Croissant, MD as Consulting Physician (Hematology and Oncology) Antony Blackbird, MD as Consulting Physician (Radiation Oncology) Emelia Loron, MD as Consulting Physician (General Surgery) Pershing Proud, RN as Oncology Nurse Navigator Donnelly Angelica, RN as Oncology Nurse Navigator  DIAGNOSIS:  Encounter Diagnosis  Name Primary?   Malignant neoplasm of upper-outer quadrant of left breast in female, estrogen receptor positive (HCC) Yes    SUMMARY OF ONCOLOGIC HISTORY: Oncology History  Malignant neoplasm of upper-outer quadrant of left breast in female, estrogen receptor positive (HCC)  01/14/2023 Initial Diagnosis   Screening mammogram detected left breast mass UOQ 12:30 position: 1.1 cm, ultrasound biopsy revealed poorly differentiated IDC grade 3, ER 100%, PR 3%, Ki-67 70%, HER2 negative, axillary lymph node biopsy: Benign concordant   01/26/2023 Cancer Staging   Staging form: Breast, AJCC 8th Edition - Clinical: Stage IA (cT1c, cN0, cM0, G3, ER+, PR+, HER2-) - Signed by Serena Croissant, MD on 01/26/2023 Histologic grading system: 3 grade system   02/03/2023 Genetic Testing   Negative [facility] CustomNext-Cancer +RNAinsight Panel.  Report date is 02/10/2023.   The CustomNext-Cancer+RNAinsight panel offered by [facility] includes sequencing, rearrangement, and RNA analysis for the following 47 genes:  APC, ATM, AXIN2, BAP1, BARD1, BMPR1A, BRCA1, BRCA2, BRIP1, CDH1, CDK4, CDKN2A, CHEK2, CTNNA1, DICER1, EPCAM, FH, GREM1, HOXB13, KIT, MBD4, MEN1, MLH1, MSH2, MSH3, MSH6, MUTYH, NF1, NTHL1, PALB2, PDGFRA, PMS2, POLD1, POLE, PTEN, RAD51C, RAD51D, SDHA, SDHB, SDHC, SDHD, SMAD4, SMARCA4, STK11, TP53, TSC1, TSC2, VHL.      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    03/01/2023 Cancer Staging   Staging form: Breast, AJCC 8th  Edition - Pathologic: Stage IA (pT1c, pN0, cM0, G3, ER+, PR+, HER2-) - Signed by Serena Croissant, MD on 03/01/2023 Histologic grading system: 3 grade system   03/03/2023 Oncotype testing   Oncotype DX recurrence score 36: Distant recurrence at 9 years: 24%   03/16/2023 - 05/20/2023 Chemotherapy   Patient is on Treatment Plan : BREAST TC q21d       CHIEF COMPLIANT: Follow-up after radiation   History of Present Illness   The patient, a breast cancer survivor, has recently completed a comprehensive treatment regimen including surgery, chemotherapy, and radiation. The patient reports some residual soreness from radiation, which is being managed with creams. The patient also reports a potential menopausal status, with the last menstrual cycle occurring prior to chemotherapy. The patient also mentions a sore spot on the foot, which is intermittent in nature. The patient is unsure if this is related to the chemotherapy.      ALLERGIES:  is allergic to peanut-containing drug products, soy allergy, egg-derived products, oxycodone, amoxicillin, amoxicillin-pot clavulanate, and shellfish-derived products.  MEDICATIONS:  Current Outpatient Medications  Medication Sig Dispense Refill   tamoxifen (NOLVADEX) 20 MG tablet Take 1 tablet (20 mg total) by mouth daily. 90 tablet 3   albuterol (VENTOLIN HFA) 108 (90 Base) MCG/ACT inhaler Inhale 2 puffs into the lungs every 6 (six) hours as needed for shortness of breath or wheezing.     azelastine (ASTELIN) 0.1 % nasal spray Place 1 spray into both nostrils in the morning.  5   desoximetasone (TOPICORT) 0.25 % cream Apply 1 Application topically 2 (two) times daily as needed (eczema).     diphenhydrAMINE (BENADRYL) 25 MG tablet Take 25 mg by  mouth daily as needed (food allergies (with restaurant eating)).     dorzolamide-timolol (COSOPT) 22.3-6.8 MG/ML ophthalmic solution Place 1 drop into both eyes 2 (two) times daily.     EPINEPHrine 0.3 mg/0.3 mL IJ SOAJ  injection Inject 0.3 mg into the muscle as needed for anaphylaxis.     ferrous sulfate 325 (65 FE) MG tablet Take 325 mg by mouth 3 (three) times a week.     fluticasone (FLONASE) 50 MCG/ACT nasal spray Place 2 sprays into both nostrils in the morning.  4   fluticasone-salmeterol (WIXELA INHUB) 250-50 MCG/ACT AEPB Inhale 1 puff into the lungs in the morning.     ketotifen (ZADITOR) 0.035 % ophthalmic solution Place 1-2 drops into both eyes 2 (two) times daily as needed (allergies.).     latanoprost (XALATAN) 0.005 % ophthalmic solution Place 1 drop into both eyes at bedtime.     loratadine (CLARITIN) 10 MG tablet Take 10 mg by mouth in the morning.     traMADol (ULTRAM) 50 MG tablet Take 1 tablet (50 mg total) by mouth every 6 (six) hours as needed. 10 tablet 0   No current facility-administered medications for this visit.    PHYSICAL EXAMINATION: ECOG PERFORMANCE STATUS: 1 - Symptomatic but completely ambulatory  Vitals:   07/26/23 1535  BP: (!) 140/71  Pulse: 70  Resp: 18  Temp: 97.9 F (36.6 C)  SpO2: 100%   Filed Weights   07/26/23 1535  Weight: 214 lb 12.8 oz (97.4 kg)     LABORATORY DATA:  I have reviewed the data as listed    Latest Ref Rng & Units 05/18/2023    7:36 AM 04/27/2023    7:56 AM 04/05/2023   11:19 AM  CMP  Glucose 70 - 99 mg/dL 161  096  045   BUN 6 - 20 mg/dL 9  10  11    Creatinine 0.44 - 1.00 mg/dL 4.09  8.11  9.14   Sodium 135 - 145 mmol/L 139  139  139   Potassium 3.5 - 5.1 mmol/L 3.5  3.4  3.3   Chloride 98 - 111 mmol/L 106  106  106   CO2 22 - 32 mmol/L 24  26  25    Calcium 8.9 - 10.3 mg/dL 9.0  8.8  8.7   Total Protein 6.5 - 8.1 g/dL 6.7  6.3  7.3   Total Bilirubin 0.3 - 1.2 mg/dL 0.3  0.2  0.2   Alkaline Phos 38 - 126 U/L 103  86  97   AST 15 - 41 U/L 19  22  51   ALT 0 - 44 U/L 8  36  56     Lab Results  Component Value Date   WBC 7.9 05/18/2023   HGB 10.8 (L) 05/18/2023   HCT 34.1 (L) 05/18/2023   MCV 69.3 (L) 05/18/2023   PLT 303  05/18/2023   NEUTROABS 5.9 05/18/2023    ASSESSMENT & PLAN:  Malignant neoplasm of upper-outer quadrant of left breast in female, estrogen receptor positive (HCC) 02/14/2023: Left lumpectomy: 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 Oncotype DX recurrence score: 36 (distant recurrence at 9 years: 24%)   Recommendation: Adjuvant chemotherapy with Taxotere and Cytoxan every 3 weeks x 4 completed 05/18/2023 Adjuvant radiation therapy completed 07/22/2023 Adjuvant antiestrogen therapy started 08/09/2023 with tamoxifen (patient had menstrual cycles until chemo)      Breast Cancer Completed surgery,  chemotherapy, and radiation. Now transitioning to hormone therapy. Some residual skin irritation from radiation, managed with Neosporin. -Start Tamoxifen 10mg  daily for two weeks, then increase to 20mg  daily if tolerated. -Return in three months for follow-up and survivorship care plan visit.  Menopausal Status Last menstrual cycle prior to chemotherapy in May. Possible resumption of cycle in September. -Draw [facility] and estradiol levels today to assess menopausal status.  General Health Maintenance / Followup Plans -Start Tamoxifen on October 15th. -Follow-up in three months with nurse practitioner for survivorship care plan visit. -Subsequent follow-up with physician in six months and then annually.          Orders Placed This Encounter  Procedures   Estradiol, Sensitive    Standing Status:   Future    Number of Occurrences:   1    Standing Expiration Date:   07/25/2024   FSH-Follicle stimulating hormone    Standing Status:   Future    Number of Occurrences:   1    Standing Expiration Date:   07/25/2024   The patient has a good understanding of the overall plan. she agrees with it. she will call with any problems that may develop before the next visit here. Total time spent: 30 mins including face to face time and  time spent for planning, charting and co-ordination of care   Tamsen Meek, MD 07/27/23

## 2023-07-27 NOTE — Telephone Encounter (Signed)
Received call from pt requesting advice from MD if okay to resume allergy injections.  Per MD okay for pt to resume tx, pt educated and verbalized understanding.

## 2023-07-29 ENCOUNTER — Telehealth: Payer: Self-pay | Admitting: Adult Health

## 2023-07-29 NOTE — Telephone Encounter (Signed)
Per IB message on 07/26/23; I called patient and left a voice mail with appointment details for her SCP appointment. I mailed a reminder notice.

## 2023-08-01 LAB — ESTRADIOL, ULTRA SENS: Estradiol, Sensitive: 9 pg/mL

## 2023-08-09 ENCOUNTER — Telehealth: Payer: Self-pay | Admitting: *Deleted

## 2023-08-09 ENCOUNTER — Encounter: Payer: Self-pay | Admitting: Hematology and Oncology

## 2023-08-09 NOTE — Telephone Encounter (Signed)
Error. Lorayne Marek, RN

## 2023-08-09 NOTE — Telephone Encounter (Signed)
Received call from pt with complaint of nausea 1 hour post first dose of Tamoxifen (1/2 tablet) today.  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  MD out of office, RN forwarded question to pharmacy team to help look into this.

## 2023-08-10 ENCOUNTER — Telehealth: Payer: Self-pay | Admitting: *Deleted

## 2023-08-10 NOTE — Telephone Encounter (Signed)
-----   Message from [place] sent at 08/10/2023 12:30 PM EDT ----- Regarding: RE: Tamoxifen and Citrus fruits Hi Adelina Mings,   Thanks for your question. From my research it doesn't look like the salt/citrate component of tamoxifen seems to be related to an actual citrus allergy or intolerance in any way. Nausea,in general, is not uncommon in tamoxifen (~26% incidence) so I would say she likely just happens to be one of the patients we would expect to experience this. It often helps to take with food or take it before bed if she was taking it in the morning initially. Many patients say that the nausea resolves within the first few weeks; however, therapy can always be re-evaluated if she finds that is not the case with her. Let me know if there is anything additional I can help answer with her. Thanks!   Judeth Cornfield, PharmD ----- Message ----- From: Mauri Pole, RN Sent: 08/09/2023   9:54 AM EDT To: Dellis Filbert, LPN; Sabino Snipes, RN; # Subject: Tamoxifen and Citrus fruits                    Hi Judeth Cornfield and John!  Mrs. Froberg called regarding a possible allergy to Tamoxifen.  She states that she took her first dose of Tamoxifen (1/2 tablet) today and within 1 hour she was nauseated.  She stated she has an allergy to citrus foods and thinks the citrate in Tamoxifen is related to citrus. Are you able to help look into this?  Adelina Mings, RN

## 2023-08-10 NOTE — Telephone Encounter (Signed)
RN placed call to pt with below information.  No answer, LVM for pt to return call to the office.

## 2023-08-23 ENCOUNTER — Encounter: Payer: Self-pay | Admitting: Radiation Oncology

## 2023-08-24 NOTE — Progress Notes (Signed)
Radiation Oncology         [phone] ________________________________  Name: Sue Beasley MRN: [id number]  Date: 08/25/2023  DOB: 1971-07-23  End of Treatment Note  Diagnosis: Stage IA (pT1c, pN0, cM0) Left Breast UOQ, Invasive Ductal Carcinoma, ER+ / PR+ / Her2-, Grade 3: s/p lumpectomy with left axillary SLN excisions followed by adjuvant chemotherapy      Indication for treatment: Curative       Radiation treatment dates: 06/23/23 through 07/22/23   Site/dose:   1) Left breast - 40.05 Gy delivered in 15 Fx at 2.67 Gy/Fx 2) Left breast boost - 10 Gy delivered in 5 Fx at 2 Gy/Fx  Technique/Mode: 3D / Photon   Beams/energy:  15X  Narrative: The patient tolerated radiation treatment relatively well. During her final weekly treatment check on 09/24, the patient endorsed itching. Physical exam performed on that same date showed hyperpigmentation changes and some erythema to the left breast area. No skin breakdown was appreciated, and she was given hydrocortisone cream to help manage her itching.   Plan: The patient has completed radiation treatment. The patient will return to radiation oncology clinic for routine followup in one month. I advised them to call or return sooner if they have any questions or concerns related to their recovery or treatment.  -----------------------------------  Billie Lade, PhD, MD  This document serves as a record of services personally performed by Antony Blackbird, MD. It was created on his behalf by Neena Rhymes, a trained medical scribe. The creation of this record is based on the scribe's personal observations and the provider's statements to them. This document has been checked and approved by the attending provider.

## 2023-08-25 ENCOUNTER — Ambulatory Visit
Admission: RE | Admit: 2023-08-25 | Discharge: 2023-08-25 | Disposition: A | Discharge status: Home / Self Care | Payer: BC Managed Care – PPO | Source: Ambulatory Visit | Attending: Radiation Oncology | Admitting: Radiation Oncology

## 2023-08-25 ENCOUNTER — Encounter: Payer: Self-pay | Admitting: Radiation Oncology

## 2023-08-25 ENCOUNTER — Other Ambulatory Visit: Payer: Self-pay

## 2023-08-25 VITALS — BP 138/59 | HR 81 | Temp 98.2°F | Resp 18 | Ht 61.0 in | Wt 212.8 lb

## 2023-08-25 DIAGNOSIS — Z923 Personal history of irradiation: Secondary | ICD-10-CM | POA: Diagnosis not present

## 2023-08-25 DIAGNOSIS — C50412 Malignant neoplasm of upper-outer quadrant of left female breast: Secondary | ICD-10-CM | POA: Insufficient documentation

## 2023-08-25 DIAGNOSIS — Z170 Estrogen receptor positive status [ER+]: Secondary | ICD-10-CM | POA: Diagnosis not present

## 2023-08-25 DIAGNOSIS — Z9221 Personal history of antineoplastic chemotherapy: Secondary | ICD-10-CM | POA: Diagnosis not present

## 2023-08-25 HISTORY — DX: Personal history of irradiation: Z92.3

## 2023-08-25 NOTE — Progress Notes (Signed)
Radiation Oncology         [phone] ________________________________  Name: Sue Beasley MRN: [id number]  Date: 08/25/2023  DOB: Jul 17, 1971  Follow-Up Visit Note  CC: [name], MD  [name], MD    ICD-10-CM   1. Malignant neoplasm of upper-outer quadrant of left breast in female, estrogen receptor positive (HCC)  C50.412    Z17.0       Diagnosis: Stage IA (pT1c, pN0, cM0) Left Breast UOQ, Invasive Ductal Carcinoma, ER+ / PR+ / Her2-, Grade 3: s/p lumpectomy with left axillary SLN excisions followed by adjuvant chemotherapy    Interval Since Last Radiation: 1 month and 4 days   Indication for treatment: Curative      Radiation treatment dates: 06/23/23 through 07/22/23  Site/dose:   1) Left breast - 40.05 Gy delivered in 15 Fx at 2.67 Gy/Fx 2) Left breast boost - 10 Gy delivered in 5 Fx at 2 Gy/Fx Technique/Mode: 3D / Photon  Beams/energy:  15X  Narrative:  The patient returns today for routine follow-up. She tolerated radiation treatment relatively well. During her final weekly treatment check on 09/24, the patient endorsed itching. Physical exam performed on that same date showed hyperpigmentation changes and some erythema to the left breast area. No skin breakdown was appreciated, and she was given hydrocortisone cream to help manage her itching.    Since completing radiation therapy, the patient followed up with Dr. Pamelia Hoit on 07/26/23. During which time, the patient reported having some residual soreness from radiation. She was also noted to have possibly entered menopause and labs were ordered to assess this.   In terms of antiestrogen therapy, she has opted to proceed with tamoxifen. She began this on October 15th.   Patient noticed some pain in her left axilla, in the region that her seroma was drained a couple of weeks ago. She has not experienced any pain since. She does experience "twinges" of pain in her breast. She denies any issues with range of  motion.                               Allergies:  is allergic to peanut-containing drug products, soy allergy, egg-derived products, oxycodone, amoxicillin, amoxicillin-pot clavulanate, and shellfish-derived products.  Meds: Current Outpatient Medications  Medication Sig Dispense Refill   albuterol (VENTOLIN HFA) 108 (90 Base) MCG/ACT inhaler Inhale 2 puffs into the lungs every 6 (six) hours as needed for shortness of breath or wheezing.     azelastine (ASTELIN) 0.1 % nasal spray Place 1 spray into both nostrils in the morning.  5   desoximetasone (TOPICORT) 0.25 % cream Apply 1 Application topically 2 (two) times daily as needed (eczema).     dorzolamide-timolol (COSOPT) 22.3-6.8 MG/ML ophthalmic solution Place 1 drop into both eyes 2 (two) times daily.     EPINEPHrine 0.3 mg/0.3 mL IJ SOAJ injection Inject 0.3 mg into the muscle as needed for anaphylaxis.     fluticasone (FLONASE) 50 MCG/ACT nasal spray Place 2 sprays into both nostrils in the morning.  4   fluticasone-salmeterol (WIXELA INHUB) 250-50 MCG/ACT AEPB Inhale 1 puff into the lungs in the morning.     ketotifen (ZADITOR) 0.035 % ophthalmic solution Place 1-2 drops into both eyes 2 (two) times daily as needed (allergies.).     latanoprost (XALATAN) 0.005 % ophthalmic solution Place 1 drop into both eyes at bedtime.     loratadine (CLARITIN) 10 MG  tablet Take 10 mg by mouth in the morning.     tamoxifen (NOLVADEX) 20 MG tablet Take 1 tablet (20 mg total) by mouth daily. 90 tablet 3   diphenhydrAMINE (BENADRYL) 25 MG tablet Take 25 mg by mouth daily as needed (food allergies (with restaurant eating)).     ferrous sulfate 325 (65 FE) MG tablet Take 325 mg by mouth 3 (three) times a week. (Patient not taking: Reported on 08/25/2023)     traMADol (ULTRAM) 50 MG tablet Take 1 tablet (50 mg total) by mouth every 6 (six) hours as needed. (Patient not taking: Reported on 08/25/2023) 10 tablet 0   No current facility-administered medications  for this encounter.    Physical Findings: The patient is in no acute distress. Patient is alert and oriented.  height is 5\' 1"  (1.549 m) and weight is 212 lb 12.8 oz (96.5 kg). Her oral temperature is 98.2 F (36.8 C). Her blood pressure is 138/59 (abnormal) and her pulse is 81. Her respiration is 18 and oxygen saturation is 100%.  Lungs are clear to auscultation bilaterally. Heart has regular rate and rhythm. No palpable cervical, supraclavicular, or axillary adenopathy. Abdomen soft, non-tender, normal bowel sounds.  Right Breast: no palpable mass, nipple discharge or bleeding. Left Breast: Peau d'orange appearance with mild edema throughout the breast. Hyperpigmentation was noted in the treatment fields along with some areas of skin peeling. Scar tissue palpated beneath the axillary incision. No signs of delayed healing or infection appreciated.   Lab Findings: Lab Results  Component Value Date   WBC 7.9 05/18/2023   HGB 10.8 (L) 05/18/2023   HCT 34.1 (L) 05/18/2023   MCV 69.3 (L) 05/18/2023   PLT 303 05/18/2023    Radiographic Findings: No results found.  Impression: Stage IA (pT1c, pN0, cM0) Left Breast UOQ, Invasive Ductal Carcinoma, ER+ / PR+ / Her2-, Grade 3: s/p lumpectomy with left axillary SLN excisions followed by adjuvant chemotherapy    The patient is doing overall and is recovering from the effects of radiation. She appeared to have some lymphedema on physical exam today, but did not wish to pursue referral to PT.   Plan: Patient will see Dr. Dwain Sarna on 08/30/2023. She will continue with antiestrogen therapy under the care of Dr. Pamelia Hoit. Per patient request, we will see her back in 3 months for follow-up. she knows to call with any questions or concerns in the meantime.     ____________________________________   Joyice Faster, PA-C   Billie Lade, PhD, MD  This document serves as a record of services personally performed by Antony Blackbird, MD. It was created on  his behalf by Neena Rhymes, a trained medical scribe. The creation of this record is based on the scribe's personal observations and the provider's statements to them. This document has been checked and approved by the attending provider.

## 2023-08-25 NOTE — Progress Notes (Signed)
Sue Beasley is here today for follow up post radiation to the breast.   Breast Side:Left   They completed their radiation on: 07/22/2023  Does the patient complain of any of the following: Post radiation skin issues: Yes she reports some darkening and peeling around breast and nipple. Breast Tenderness: No  Breast Swelling: No Lymphadema: No Range of Motion limitations: No Fatigue post radiation: No Appetite: Good   BP (!) 138/59 (BP Location: Right Arm, Patient Position: Sitting)   Pulse 81   Temp 98.2 F (36.8 C) (Oral)   Resp 18   Ht 5\' 1"  (1.549 m)   Wt 212 lb 12.8 oz (96.5 kg)   SpO2 100%   BMI 40.21 kg/m

## 2023-09-05 ENCOUNTER — Ambulatory Visit: Payer: BC Managed Care – PPO | Attending: Hematology and Oncology

## 2023-09-05 VITALS — Wt 211.5 lb

## 2023-09-05 DIAGNOSIS — Z483 Aftercare following surgery for neoplasm: Secondary | ICD-10-CM | POA: Insufficient documentation

## 2023-09-05 NOTE — Therapy (Signed)
OUTPATIENT PHYSICAL THERAPY SOZO SCREENING NOTE   Patient Name: Sue Beasley MRN: [id number] DOB:[birth year], 52 y.o., female Today's Date: 09/05/2023  PCP: [name], MD REFERRING PROVIDER: Serena Croissant, MD   PT End of Session - 09/05/23 1526     Visit Number 2   # unchanged due to screen only   PT Start Time 1525    PT Stop Time 1529    PT Time Calculation (min) 4 min    Activity Tolerance Patient tolerated treatment well    Behavior During Therapy [facility] for tasks assessed/performed             Past Medical History:  Diagnosis Date   ALLERGIC RHINITIS 11/06/2009   Allergy    Anemia    ANEMIA-IRON DEFICIENCY 11/20/2009   Asthma    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 Antony Blackbird   Irreducible umbilical hernia 03/01/2011   Wears glasses    Past Surgical History:  Procedure Laterality Date   BREAST BIOPSY Left 01/14/2023   [place] LT BREAST BX W LOC DEV 1ST LESION IMG BX SPEC US GUIDE 01/14/2023 [facility] MAMMOGRAP[facility]   BREAST BIOPSY  02/10/2023   MM LT RADIOACTIVE SEED LOC MAMMO GUIDE 02/10/2023 [facility] MAMMOGRAPHY   BREAST LUMPECTOMY WITH RADIOACTIVE SEED AND SENTINEL LYMPH NODE BIOPSY Left 02/14/2023   Procedure: LEFT BREAST LUMPECTOMY WITH RADIOACTIVE SEED AND AXILLARY SENTINEL LYMPH NODE BIOPSY;  Surgeon: Emelia Loron, MD;  Location: [facility];  Service: General;  Laterality: Left;   PORT-A-CATH REMOVAL N/A 05/31/2023   Procedure: REMOVAL PORT-A-CATH;  Surgeon: Emelia Loron, MD;  Location: [facility];  Service: General;  Laterality: N/A;   PORTACATH PLACEMENT Right 03/15/2023   Procedure: INSERTION PORT-A-CATH;  Surgeon: Emelia Loron, MD;  Location: [facility] OR;  Service: General;  Laterality: Right;   UMBILICAL HERNIA REPAIR  05/13/11   Patient Active Problem List   Diagnosis Date Noted   Port-A-Cath in place 03/23/2023    Genetic testing 02/04/2023   Malignant neoplasm of upper-outer quadrant of left breast in female, estrogen receptor positive (HCC) 01/24/2023   Low vitamin D level 11/19/2021   Incontinence 12/19/2020   Low mean corpuscular volume (MCV) 11/23/2020   Pain and swelling of wrist, left 03/14/2020   Hyperglycemia 11/09/2019   Uterine leiomyoma 11/09/2019   Acute upper respiratory infection 02/12/2019   Increased body mass index 11/02/2018   Obesity 08/02/2014   Heart murmur, systolic 08/01/2014   Encounter for well adult exam with abnormal findings 03/01/2011   Hyperlipidemia 11/20/2009   Iron deficiency anemia 11/20/2009   Allergic rhinitis 11/06/2009   Asthma 11/06/2009    REFERRING DIAG: left breast cancer at risk for lymphedema  THERAPY DIAG: Aftercare following surgery for neoplasm  PERTINENT HISTORY: 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 It measures 1.1 cm and is located in the upper outer quadrant. It is ER/PR positive HER2 negative with a Ki67 of 70%. left lumpectomy and SLNB on 02/14/23 with oncotype testing (still pending) and radiation. 8 negative nodes removed. 1 aspiration in the axilla of 75ml   PRECAUTIONS: left UE Lymphedema risk, None  SUBJECTIVE: Pt returns for her 3 month L-Dex screen.   PAIN:  Are you having pain? No  SOZO SCREENING: Patient was assessed today using the SOZO machine to determine the lymphedema index score. This was compared  to her baseline score. It was determined that she is within the recommended range when compared to her baseline and no further action is needed at this time. She will continue SOZO screenings. These are done every 3 months for 2 years post operatively followed by every 6 months for 2 years, and then annually.   L-DEX FLOWSHEETS - 09/05/23 1500       L-DEX LYMPHEDEMA SCREENING   Measurement Type Unilateral    L-DEX MEASUREMENT EXTREMITY Upper Extremity    POSITION  Standing    DOMINANT SIDE Left    At Risk  Side Left    BASELINE SCORE (UNILATERAL) -1.6    L-DEX SCORE (UNILATERAL) -1.9    VALUE CHANGE (UNILAT) -0.3               Hermenia Bers, PTA 09/05/2023, 3:28 PM

## 2023-10-24 ENCOUNTER — Encounter: Payer: Self-pay | Admitting: Hematology and Oncology

## 2023-10-31 ENCOUNTER — Encounter: Payer: BC Managed Care – PPO | Admitting: Adult Health

## 2023-11-08 ENCOUNTER — Telehealth: Payer: Self-pay | Admitting: Adult Health

## 2023-11-08 NOTE — Telephone Encounter (Signed)
 Per Mardella Layman call to Pt to see if she can come earlier to her appointment on  Monday 1/20 Original appointment scheduled for 120. Lindsey requesting Pt come at 12 noon. Message left at home and on cell phone awaiting return call.

## 2023-11-11 ENCOUNTER — Other Ambulatory Visit: Payer: Self-pay

## 2023-11-11 DIAGNOSIS — Z170 Estrogen receptor positive status [ER+]: Secondary | ICD-10-CM

## 2023-11-11 DIAGNOSIS — Z95828 Presence of other vascular implants and grafts: Secondary | ICD-10-CM

## 2023-11-14 ENCOUNTER — Encounter: Payer: Self-pay | Admitting: Hematology and Oncology

## 2023-11-14 ENCOUNTER — Other Ambulatory Visit: Payer: BC Managed Care – PPO

## 2023-11-14 ENCOUNTER — Inpatient Hospital Stay: Payer: 59 | Attending: Hematology and Oncology

## 2023-11-14 ENCOUNTER — Encounter: Payer: Self-pay | Admitting: Adult Health

## 2023-11-14 ENCOUNTER — Inpatient Hospital Stay (HOSPITAL_BASED_OUTPATIENT_CLINIC_OR_DEPARTMENT_OTHER): Payer: 59 | Admitting: Adult Health

## 2023-11-14 ENCOUNTER — Encounter: Payer: BC Managed Care – PPO | Admitting: Adult Health

## 2023-11-14 VITALS — BP 151/83 | HR 61 | Temp 97.6°F | Resp 17 | Wt 215.7 lb

## 2023-11-14 DIAGNOSIS — C50412 Malignant neoplasm of upper-outer quadrant of left female breast: Secondary | ICD-10-CM | POA: Insufficient documentation

## 2023-11-14 DIAGNOSIS — Z170 Estrogen receptor positive status [ER+]: Secondary | ICD-10-CM | POA: Insufficient documentation

## 2023-11-14 DIAGNOSIS — Z923 Personal history of irradiation: Secondary | ICD-10-CM | POA: Diagnosis not present

## 2023-11-14 DIAGNOSIS — Z9221 Personal history of antineoplastic chemotherapy: Secondary | ICD-10-CM | POA: Insufficient documentation

## 2023-11-14 DIAGNOSIS — Z95828 Presence of other vascular implants and grafts: Secondary | ICD-10-CM

## 2023-11-14 DIAGNOSIS — Z1721 Progesterone receptor positive status: Secondary | ICD-10-CM | POA: Diagnosis not present

## 2023-11-14 DIAGNOSIS — Z79810 Long term (current) use of selective estrogen receptor modulators (SERMs): Secondary | ICD-10-CM | POA: Insufficient documentation

## 2023-11-14 DIAGNOSIS — Z1732 Human epidermal growth factor receptor 2 negative status: Secondary | ICD-10-CM | POA: Insufficient documentation

## 2023-11-14 NOTE — Progress Notes (Signed)
SURVIVORSHIP VISIT:  BRIEF ONCOLOGIC HISTORY:  Oncology History  Malignant neoplasm of upper-outer quadrant of left breast in female, estrogen receptor positive (HCC)  01/14/2023 Initial Diagnosis   Screening mammogram detected left breast mass UOQ 12:30 position: 1.1 cm, ultrasound biopsy revealed poorly differentiated IDC grade 3, ER 100%, PR 3%, Ki-67 70%, HER2 negative, axillary lymph node biopsy: Benign concordant   01/26/2023 Cancer Staging   Staging form: Breast, AJCC 8th Edition - Clinical: Stage IA (cT1c, cN0, cM0, G3, ER+, PR+, HER2-) - Signed by Serena Croissant, MD on 01/26/2023 Histologic grading system: 3 grade system   02/03/2023 Genetic Testing   Negative [facility] CustomNext-Cancer +RNAinsight Panel.  Report date is 02/10/2023.   The CustomNext-Cancer+RNAinsight panel offered by [facility] includes sequencing, rearrangement, and RNA analysis for the following 47 genes:  APC, ATM, AXIN2, BAP1, BARD1, BMPR1A, BRCA1, BRCA2, BRIP1, CDH1, CDK4, CDKN2A, CHEK2, CTNNA1, DICER1, EPCAM, FH, GREM1, HOXB13, KIT, MBD4, MEN1, MLH1, MSH2, MSH3, MSH6, MUTYH, NF1, NTHL1, PALB2, PDGFRA, PMS2, POLD1, POLE, PTEN, RAD51C, RAD51D, SDHA, SDHB, SDHC, SDHD, SMAD4, SMARCA4, STK11, TP53, TSC1, TSC2, VHL.      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    03/01/2023 Cancer Staging   Staging form: Breast, AJCC 8th Edition - Pathologic: Stage IA (pT1c, pN0, cM0, G3, ER+, PR+, HER2-) - Signed by Serena Croissant, MD on 03/01/2023 Histologic grading system: 3 grade system   03/03/2023 Oncotype testing   Oncotype DX recurrence score 36: Distant recurrence at 9 years: 24%   03/16/2023 - 05/20/2023 Chemotherapy   Patient is on Treatment Plan : BREAST TC q21d     06/23/2023 - 07/22/2023 Radiation Therapy   Left breast - 40.05 Gy delivered in 15 Fx at 2.67 Gy/Fx Left breast boost - 10 Gy delivered in 5 Fx at 2  Gy/Fx   07/2023 -  Anti-estrogen oral therapy   20 mg Tamoxifen     INTERVAL HISTORY:  [name] to review her survivorship care plan detailing her treatment course for breast cancer, as well as monitoring long-term side effects of that treatment, education regarding health maintenance, screening, and overall wellness and health promotion.     Overall, [name] reports feeling quite well.  She is taking tamoxifen daily with good tolerance.  She denies any significant side effects.  REVIEW OF SYSTEMS:  Review of Systems  Constitutional:  Negative for appetite change, chills, fatigue, fever and unexpected weight change.  HENT:   Negative for hearing loss, lump/mass and trouble swallowing.   Eyes:  Negative for eye problems and icterus.  Respiratory:  Negative for chest tightness, cough and shortness of breath.   Cardiovascular:  Negative for chest pain, leg swelling and palpitations.  Gastrointestinal:  Negative for abdominal distention, abdominal pain, constipation, diarrhea, nausea and vomiting.  Endocrine: Negative for hot flashes.  Genitourinary:  Negative for difficulty urinating.   Musculoskeletal:  Negative for arthralgias.  Skin:  Negative for itching and rash.  Neurological:  Negative for dizziness, extremity weakness, headaches and numbness.  Hematological:  Negative for adenopathy. Does not bruise/bleed easily.  Psychiatric/Behavioral:  Negative for depression. The patient is not nervous/anxious.    Breast: Denies any new nodularity, masses, tenderness, nipple changes, or nipple discharge.       PAST MEDICAL/SURGICAL HISTORY:  Past Medical History:  Diagnosis Date   ALLERGIC RHINITIS 11/06/2009   Allergy    Anemia  ANEMIA-IRON DEFICIENCY 11/20/2009   Asthma    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 Antony Blackbird   Irreducible umbilical hernia 03/01/2011   Wears  glasses    Past Surgical History:  Procedure Laterality Date   BREAST BIOPSY Left 01/14/2023   [place] LT BREAST BX W LOC DEV 1ST LESION IMG BX SPEC US GUIDE 01/14/2023 [facility]AMMOGRAP[facility]   BREAST BIOPSY  02/10/2023   MM LT RADIOACTIVE SEED LOC MAMMO GUIDE 02/10/2023 [facility]AMMOGRAPHY   BREAST LUMPECTOMY WITH RADIOACTIVE SEED AND SENTINEL LYMPH NODE BIOPSY Left 02/14/2023   Procedure: LEFT BREAST LUMPECTOMY WITH RADIOACTIVE SEED AND AXILLARY SENTINEL LYMPH NODE BIOPSY;  Surgeon: Emelia Loron, MD;  Location: [facility];  Service: General;  Laterality: Left;   PORT-A-CATH REMOVAL N/A 05/31/2023   Procedure: REMOVAL PORT-A-CATH;  Surgeon: Emelia Loron, MD;  Location: [facility];  Service: General;  Laterality: N/A;   PORTACATH PLACEMENT Right 03/15/2023   Procedure: INSERTION PORT-A-CATH;  Surgeon: Emelia Loron, MD;  Location: [facility] OR;  Service: General;  Laterality: Right;   UMBILICAL HERNIA REPAIR  05/13/11     ALLERGIES:  Allergies  Allergen Reactions   Peanut-Containing Drug Products Anaphylaxis   Soy Allergy (Do Not Select) Nausea Only   Egg-Derived Products Other (See Comments)    Upset stomach   Oxycodone Other (See Comments)    Tongue tingling    Amoxicillin Hives and Rash   Amoxicillin-Pot Clavulanate Rash   Shellfish-Derived Products Itching and Rash     CURRENT MEDICATIONS:  Outpatient Encounter Medications as of 11/14/2023  Medication Sig   albuterol (VENTOLIN HFA) 108 (90 Base) MCG/ACT inhaler Inhale 2 puffs into the lungs every 6 (six) hours as needed for shortness of breath or wheezing.   azelastine (ASTELIN) 0.1 % nasal spray Place 1 spray into both nostrils in the morning.   desoximetasone (TOPICORT) 0.25 % cream Apply 1 Application topically 2 (two) times daily as needed (eczema).   diphenhydrAMINE (BENADRYL) 25 MG tablet Take 25 mg by mouth daily as needed (food allergies (with restaurant eating)).   dorzolamide-timolol (COSOPT)  22.3-6.8 MG/ML ophthalmic solution Place 1 drop into both eyes 2 (two) times daily.   EPINEPHrine 0.3 mg/0.3 mL IJ SOAJ injection Inject 0.3 mg into the muscle as needed for anaphylaxis.   ferrous sulfate 325 (65 FE) MG tablet Take 325 mg by mouth 3 (three) times a week. (Patient not taking: Reported on 08/25/2023)   fluticasone (FLONASE) 50 MCG/ACT nasal spray Place 2 sprays into both nostrils in the morning.   fluticasone-salmeterol (WIXELA INHUB) 250-50 MCG/ACT AEPB Inhale 1 puff into the lungs in the morning.   ketotifen (ZADITOR) 0.035 % ophthalmic solution Place 1-2 drops into both eyes 2 (two) times daily as needed (allergies.).   latanoprost (XALATAN) 0.005 % ophthalmic solution Place 1 drop into both eyes at bedtime.   loratadine (CLARITIN) 10 MG tablet Take 10 mg by mouth in the morning.   tamoxifen (NOLVADEX) 20 MG tablet Take 1 tablet (20 mg total) by mouth daily.   traMADol (ULTRAM) 50 MG tablet Take 1 tablet (50 mg total) by mouth every 6 (six) hours as needed. (Patient not taking: Reported on 08/25/2023)   No facility-administered encounter medications on file as of 11/14/2023.     ONCOLOGIC FAMILY HISTORY:  Family History  Problem Relation Age of Onset   Hyperlipidemia  Mother    Diabetes Father    Hypertension Father    Colon polyps Sister    Esophageal cancer Maternal Uncle 23   Pancreatic cancer Cousin 10       mat female cousin   Prostate cancer Cousin 4       mat cousin   Breast cancer Neg Hx    Colon cancer Neg Hx    Stomach cancer Neg Hx    Rectal cancer Neg Hx      SOCIAL HISTORY:  Social History   Socioeconomic History   Marital status: Single    Spouse name: Not on file   Number of children: 0   Years of education: Not on file   Highest education level: Not on file  Occupational History   Occupation: Editor, commissioning    Comment: calculus  Tobacco Use   Smoking status: Never   Smokeless tobacco: Never  Vaping Use   Vaping status: Never Used   Substance and Sexual Activity   Alcohol use: No   Drug use: No   Sexual activity: Not Currently    Birth control/protection: None  Other Topics Concern   Not on file  Social History Narrative   Not on file   Social Drivers of Health   Financial Resource Strain: Not on file  Food Insecurity: No Food Insecurity (06/06/2023)   Hunger Vital Sign    Worried About Running Out of Food in the Last Year: Never true    Ran Out of Food in the Last Year: Never true  Transportation Needs: No Transportation Needs (06/06/2023)   PRAPARE - Administrator, Civil Service (Medical): No    Lack of Transportation (Non-Medical): No  Physical Activity: Not on file  Stress: Not on file  Social Connections: Not on file  Intimate Partner Violence: Not At Risk (06/06/2023)   Humiliation, Afraid, Rape, and Kick questionnaire    Fear of Current or Ex-Partner: No    Emotionally Abused: No    Physically Abused: No    Sexually Abused: No     OBSERVATIONS/OBJECTIVE:  There were no vitals taken for this visit. GENERAL: Patient is a well appearing female in no acute distress HEENT:  Sclerae anicteric.  Oropharynx clear and moist. No ulcerations or evidence of oropharyngeal candidiasis. Neck is supple.  NODES:  No cervical, supraclavicular, or axillary lymphadenopathy palpated.  BREAST EXAM: Right breast benign, left breast status postlumpectomy and radiation no sign of local recurrence. LUNGS:  Clear to auscultation bilaterally.  No wheezes or rhonchi. HEART:  Regular rate and rhythm. No murmur appreciated. ABDOMEN:  Soft, nontender.  Positive, normoactive bowel sounds. No organomegaly palpated. MSK:  No focal spinal tenderness to palpation. Full range of motion bilaterally in the upper extremities. EXTREMITIES:  No peripheral edema.   SKIN:  Clear with no obvious rashes or skin changes. No nail dyscrasia. NEURO:  Nonfocal. Well oriented.  Appropriate affect.   LABORATORY DATA:  None for this  visit.  DIAGNOSTIC IMAGING:  None for this visit.      ASSESSMENT AND PLAN:  Ms.. Sue Beasley is a pleasant 53 y.o. female with Stage IA left breast invasive ductal carcinoma, ER+/PR+/HER2-, diagnosed in 12/2022, treated with lumpectomy, adjuvant chemotherapy, adjuvant radiation therapy, and anti-estrogen therapy with Tamoxifen beginning in 07/2023.  She presents to the Survivorship Clinic for our initial meeting and routine follow-up post-completion of treatment for breast cancer.    1. Stage IA left breast cancer:  Ms. Blauer is continuing to recover  from definitive treatment for breast cancer. She will follow-up with her medical oncologist, Dr. Pamelia Hoit with history and physical exam per surveillance protocol.  She will continue her anti-estrogen therapy with Tamoxifen. Thus far, she is tolerating the Tamoxifen well, with minimal side effects. Her mammogram is due 12/2023; orders placed today.   Today, a comprehensive survivorship care plan and treatment summary was reviewed with the patient today detailing her breast cancer diagnosis, treatment course, potential late/long-term effects of treatment, appropriate follow-up care with recommendations for the future, and patient education resources.  A copy of this summary, along with a letter will be sent to the patient's primary care provider via mail/fax/In Basket message after today's visit.    2. Bone health:  She was given education on specific activities to promote bone health.  3. Cancer screening:  Due to Ms. Ferrari's history and her age, she should receive screening for skin cancers, colon cancer, and gynecologic cancers.  The information and recommendations are listed on the patient's comprehensive care plan/treatment summary and were reviewed in detail with the patient.    4. Health maintenance and wellness promotion: Ms. Zarlengo was encouraged to consume 5-7 servings of fruits and vegetables per day. We reviewed the "Nutrition Rainbow" handout.  She  was also encouraged to engage in moderate to vigorous exercise for 30 minutes per day most days of the week.  She was instructed to limit her alcohol consumption and continue to abstain from tobacco use.     5. Support services/counseling: It is not uncommon for this period of the patient's cancer care trajectory to be one of many emotions and stressors.   She was given information regarding our available services and encouraged to contact me with any questions or for help enrolling in any of our support group/programs.    Follow up instructions:    -Return to cancer center in 6 months for f/u with Dr. Pamelia Hoit  -Mammogram due in 12/2023 -She is welcome to return back to the Survivorship Clinic at any time; no additional follow-up needed at this time.  -Consider referral back to survivorship as a long-term survivor for continued surveillance  The patient was provided an opportunity to ask questions and all were answered. The patient agreed with the plan and demonstrated an understanding of the instructions.   Total encounter time: 40 minutes*in face-to-face visit time, chart review, lab review, care coordination, order entry, and documentation of the encounter time.    Lillard Anes, NP 11/14/23 11:40 AM Medical Oncology and Hematology [facility] [address] [place], [place] [address] Tel. [phone]    Fax. [phone]  *Total Encounter Time as defined by the Centers for Medicare and Medicaid Services includes, in addition to the face-to-face time of a patient visit (documented in the note above) non-face-to-face time: obtaining and reviewing outside history, ordering and reviewing medications, tests or procedures, care coordination (communications with other health care professionals or caregivers) and documentation in the medical record.

## 2023-11-15 LAB — FOLLICLE STIMULATING HORMONE: FSH: 24.2 m[IU]/mL

## 2023-11-23 NOTE — Progress Notes (Signed)
Radiation Oncology         [phone] ________________________________  Name: Sue Beasley MRN: [id number]  Date: 11/24/2023  DOB: May 09, 1971  Follow-Up Visit Note  CC: [name], MD  [name], MD  No diagnosis found.  Diagnosis:   Stage IA (pT1c, pN0, cM0) Left Breast UOQ, Invasive Ductal Carcinoma, ER+ / PR+ / Her2-, Grade 3: s/p lumpectomy with left axillary SLN excisions followed by adjuvant chemotherapy    Indication for treatment: Curative    Interval Since Last Radiation:  4 months 4 days      Radiation treatment dates: 06/23/23 through 07/22/23  Site/dose:   1) Left breast - 40.05 Gy delivered in 15 Fx at 2.67 Gy/Fx 2) Left breast boost - 10 Gy delivered in 5 Fx at 2 Gy/Fx Technique/Mode: 3D / Photon  Beams/energy:  15X  Narrative:  The patient returns today for routine follow-up. She was last seen in office on 08-25-23 for a routine follow up. Since her last visit, she followed up with Dr. Dwain Sarna on 08-30-23. At that time she was doing well overall. She denied symptoms or concerns indication disease recurrence. Patient then presented for a PT treatment on 09-05-23, she underwent a SOZO screening which determined that her lymphedema is within the recommended range when compared to her baseline and no further action is needed at this time.   Most recent visit with NP Lillard Anes on 11-14-23, she reported feeling quite well and reports compliance with tamoxifen daily with good tolerance. No symptoms or concerns indication disease recurrence was reported.                     No other significant oncologic interval history since the patient was last seen.   Of note: restaging diagnostic mammogram is scheduled for 12-30-23.  Following up with Ophthalmology for myopia with presbyopia of both eyes.                     Allergies:  is allergic to peanut-containing drug products, soy allergy (do not select), egg-derived products, oxycodone, amoxicillin,  amoxicillin-pot clavulanate, and shellfish-derived products.  Meds: Current Outpatient Medications  Medication Sig Dispense Refill   albuterol (VENTOLIN HFA) 108 (90 Base) MCG/ACT inhaler Inhale 2 puffs into the lungs every 6 (six) hours as needed for shortness of breath or wheezing.     azelastine (ASTELIN) 0.1 % nasal spray Place 1 spray into both nostrils in the morning.  5   desoximetasone (TOPICORT) 0.25 % cream Apply 1 Application topically 2 (two) times daily as needed (eczema).     diphenhydrAMINE (BENADRYL) 25 MG tablet Take 25 mg by mouth daily as needed (food allergies (with restaurant eating)).     dorzolamide-timolol (COSOPT) 22.3-6.8 MG/ML ophthalmic solution Place 1 drop into both eyes 2 (two) times daily.     EPINEPHrine 0.3 mg/0.3 mL IJ SOAJ injection Inject 0.3 mg into the muscle as needed for anaphylaxis.     ferrous sulfate 325 (65 FE) MG tablet Take 325 mg by mouth 3 (three) times a week. (Patient not taking: Reported on 08/25/2023)     fluticasone (FLONASE) 50 MCG/ACT nasal spray Place 2 sprays into both nostrils in the morning.  4   fluticasone-salmeterol (WIXELA INHUB) 250-50 MCG/ACT AEPB Inhale 1 puff into the lungs in the morning.     ketotifen (ZADITOR) 0.035 % ophthalmic solution Place 1-2 drops into both eyes 2 (two) times daily as needed (allergies.).  latanoprost (XALATAN) 0.005 % ophthalmic solution Place 1 drop into both eyes at bedtime.     loratadine (CLARITIN) 10 MG tablet Take 10 mg by mouth in the morning.     tamoxifen (NOLVADEX) 20 MG tablet Take 1 tablet (20 mg total) by mouth daily. 90 tablet 3   No current facility-administered medications for this encounter.    Physical Findings: The patient is in no acute distress. Patient is alert and oriented.  vitals were not taken for this visit. .  No significant changes. Lungs are clear to auscultation bilaterally. Heart has regular rate and rhythm. No palpable cervical, supraclavicular, or axillary  adenopathy. Abdomen soft, non-tender, normal bowel sounds.   Lab Findings: Lab Results  Component Value Date   WBC 7.9 05/18/2023   HGB 10.8 (L) 05/18/2023   HCT 34.1 (L) 05/18/2023   MCV 69.3 (L) 05/18/2023   PLT 303 05/18/2023    Radiographic Findings: No results found.  Impression:  Stage IA (pT1c, pN0, cM0) Left Breast UOQ, Invasive Ductal Carcinoma, ER+ / PR+ / Her2-, Grade 3: s/p lumpectomy with left axillary SLN excisions followed by adjuvant chemotherapy      The patient is recovering from the effects of radiation.  ***  Plan:  ***   *** minutes of total time was spent for this patient encounter, including preparation, face-to-face counseling with the patient and coordination of care, physical exam, and documentation of the encounter. ____________________________________  Billie Lade, PhD, MD  This document serves as a record of services personally performed by Antony Blackbird, MD. It was created on his behalf by Herbie Saxon, a trained medical scribe. The creation of this record is based on the scribe's personal observations and the provider's statements to them. This document has been checked and approved by the attending provider.

## 2023-11-24 ENCOUNTER — Encounter: Payer: Self-pay | Admitting: Radiation Oncology

## 2023-11-24 ENCOUNTER — Ambulatory Visit
Admission: RE | Admit: 2023-11-24 | Discharge: 2023-11-24 | Disposition: A | Discharge status: Home / Self Care | Payer: 59 | Source: Ambulatory Visit | Attending: Radiation Oncology | Admitting: Radiation Oncology

## 2023-11-24 VITALS — BP 155/89 | HR 84 | Temp 96.4°F | Resp 18 | Ht 61.0 in | Wt 216.4 lb

## 2023-11-24 DIAGNOSIS — Z79899 Other long term (current) drug therapy: Secondary | ICD-10-CM | POA: Insufficient documentation

## 2023-11-24 DIAGNOSIS — Z923 Personal history of irradiation: Secondary | ICD-10-CM | POA: Diagnosis not present

## 2023-11-24 DIAGNOSIS — Z7951 Long term (current) use of inhaled steroids: Secondary | ICD-10-CM | POA: Diagnosis not present

## 2023-11-24 DIAGNOSIS — Z170 Estrogen receptor positive status [ER+]: Secondary | ICD-10-CM | POA: Insufficient documentation

## 2023-11-24 DIAGNOSIS — I890 Lymphedema, not elsewhere classified: Secondary | ICD-10-CM | POA: Insufficient documentation

## 2023-11-24 DIAGNOSIS — C50412 Malignant neoplasm of upper-outer quadrant of left female breast: Secondary | ICD-10-CM | POA: Insufficient documentation

## 2023-11-24 NOTE — Progress Notes (Addendum)
Sue Beasley is here today for follow up post radiation to the breast.   Breast Side:left   09 They completed their radiation on:  07/22/23  Does the patient complain of any of the following: Post radiation skin issues:  continues to be hyperpigmented.  Breast Tenderness: No Breast Swelling: Reports heaviness to breast.  Lymphadema: No  Range of Motion limitations: No Fatigue post radiation: No Appetite good/fair/poor: Good  Additional comments if applicable:  Patient has started tamoxifen as directed.  BP (!) 155/89 (BP Location: Right Arm, Patient Position: Sitting)   Pulse 84   Temp (!) 96.4 F (35.8 C) (Temporal)   Resp 18   Ht 5\' 1"  (1.549 m)   Wt 216 lb 6 oz (98.1 kg)   SpO2 99%   BMI 40.88 kg/m

## 2023-11-25 ENCOUNTER — Other Ambulatory Visit (INDEPENDENT_AMBULATORY_CARE_PROVIDER_SITE_OTHER): Payer: 59

## 2023-11-25 DIAGNOSIS — E538 Deficiency of other specified B group vitamins: Secondary | ICD-10-CM

## 2023-11-25 DIAGNOSIS — R739 Hyperglycemia, unspecified: Secondary | ICD-10-CM

## 2023-11-25 DIAGNOSIS — R7989 Other specified abnormal findings of blood chemistry: Secondary | ICD-10-CM | POA: Diagnosis not present

## 2023-11-25 DIAGNOSIS — E7800 Pure hypercholesterolemia, unspecified: Secondary | ICD-10-CM

## 2023-11-25 LAB — CBC WITH DIFFERENTIAL/PLATELET
Basophils Absolute: 0.1 10*3/uL (ref 0.0–0.1)
Basophils Relative: 1.2 % (ref 0.0–3.0)
Eosinophils Absolute: 0.1 10*3/uL (ref 0.0–0.7)
Eosinophils Relative: 3.1 % (ref 0.0–5.0)
HCT: 37.4 % (ref 36.0–46.0)
Hemoglobin: 11.9 g/dL — ABNORMAL LOW (ref 12.0–15.0)
Lymphocytes Relative: 33.8 % (ref 12.0–46.0)
Lymphs Abs: 1.5 10*3/uL (ref 0.7–4.0)
MCHC: 31.8 g/dL (ref 30.0–36.0)
MCV: 70.1 fL — ABNORMAL LOW (ref 78.0–100.0)
Monocytes Absolute: 0.5 10*3/uL (ref 0.1–1.0)
Monocytes Relative: 10.4 % (ref 3.0–12.0)
Neutro Abs: 2.2 10*3/uL (ref 1.4–7.7)
Neutrophils Relative %: 51.5 % (ref 43.0–77.0)
Platelets: 215 10*3/uL (ref 150.0–400.0)
RBC: 5.33 Mil/uL — ABNORMAL HIGH (ref 3.87–5.11)
RDW: 16.6 % — ABNORMAL HIGH (ref 11.5–15.5)
WBC: 4.4 10*3/uL (ref 4.0–10.5)

## 2023-11-25 LAB — HEPATIC FUNCTION PANEL
ALT: 14 U/L (ref 0–35)
AST: 21 U/L (ref 0–37)
Albumin: 3.8 g/dL (ref 3.5–5.2)
Alkaline Phosphatase: 88 U/L (ref 39–117)
Bilirubin, Direct: 0 mg/dL (ref 0.0–0.3)
Total Bilirubin: 0.3 mg/dL (ref 0.2–1.2)
Total Protein: 6.8 g/dL (ref 6.0–8.3)

## 2023-11-25 LAB — TSH: TSH: 1.33 u[IU]/mL (ref 0.35–5.50)

## 2023-11-25 LAB — URINALYSIS, ROUTINE W REFLEX MICROSCOPIC
Bilirubin Urine: NEGATIVE
Ketones, ur: NEGATIVE
Leukocytes,Ua: NEGATIVE
Nitrite: NEGATIVE
Specific Gravity, Urine: 1.02 (ref 1.000–1.030)
Total Protein, Urine: NEGATIVE
Urine Glucose: NEGATIVE
Urobilinogen, UA: 0.2 (ref 0.0–1.0)
pH: 5.5 (ref 5.0–8.0)

## 2023-11-25 LAB — LIPID PANEL
Cholesterol: 185 mg/dL (ref 0–200)
HDL: 56.1 mg/dL (ref >39.00)
LDL Cholesterol: 103 mg/dL — ABNORMAL HIGH (ref 0–99)
NonHDL: 128.49
Total CHOL/HDL Ratio: 3
Triglycerides: 126 mg/dL (ref 0.0–149.0)
VLDL: 25.2 mg/dL (ref 0.0–40.0)

## 2023-11-25 LAB — BASIC METABOLIC PANEL
BUN: 13 mg/dL (ref 6–23)
CO2: 25 meq/L (ref 19–32)
Calcium: 8.7 mg/dL (ref 8.4–10.5)
Chloride: 104 meq/L (ref 96–112)
Creatinine, Ser: 0.67 mg/dL (ref 0.40–1.20)
GFR: 100.92 mL/min (ref >60.00)
Glucose, Bld: 87 mg/dL (ref 70–99)
Potassium: 3.9 meq/L (ref 3.5–5.1)
Sodium: 138 meq/L (ref 135–145)

## 2023-11-25 LAB — VITAMIN B12: Vitamin B-12: 220 pg/mL (ref 211–911)

## 2023-11-25 LAB — VITAMIN D 25 HYDROXY (VIT D DEFICIENCY, FRACTURES): VITD: 10.72 ng/mL — ABNORMAL LOW (ref 30.00–100.00)

## 2023-11-25 LAB — HEMOGLOBIN A1C: Hgb A1c MFr Bld: 6.2 % (ref 4.6–6.5)

## 2023-11-29 ENCOUNTER — Ambulatory Visit (INDEPENDENT_AMBULATORY_CARE_PROVIDER_SITE_OTHER): Payer: 59 | Admitting: Internal Medicine

## 2023-11-29 ENCOUNTER — Encounter: Payer: Self-pay | Admitting: Internal Medicine

## 2023-11-29 VITALS — BP 122/78 | HR 65 | Temp 98.9°F | Ht 61.0 in | Wt 216.0 lb

## 2023-11-29 DIAGNOSIS — E7800 Pure hypercholesterolemia, unspecified: Secondary | ICD-10-CM

## 2023-11-29 DIAGNOSIS — Z0000 Encounter for general adult medical examination without abnormal findings: Secondary | ICD-10-CM

## 2023-11-29 DIAGNOSIS — R739 Hyperglycemia, unspecified: Secondary | ICD-10-CM | POA: Diagnosis not present

## 2023-11-29 DIAGNOSIS — E538 Deficiency of other specified B group vitamins: Secondary | ICD-10-CM

## 2023-11-29 DIAGNOSIS — Z0001 Encounter for general adult medical examination with abnormal findings: Secondary | ICD-10-CM

## 2023-11-29 DIAGNOSIS — R7989 Other specified abnormal findings of blood chemistry: Secondary | ICD-10-CM

## 2023-11-29 NOTE — Progress Notes (Signed)
 Patient ID: Sue Beasley, female   DOB: 04-26-1971, 53 y.o.   MRN: [id number]         Chief Complaint:: wellness exam and low vit d, hld, low b12, hyperglycemia,        HPI:  Sue Beasley is a 53 y.o. female here for wellness exam;d plans to see GYN soon, declines all immunizations, o/w up to date                        Also doing well after lumpectomy/xrt and chemo 2024 with breast ca.  Pt denies chest pain, increased sob or doe, wheezing, orthopnea, PND, increased LE swelling, palpitations, dizziness or syncope.   Pt denies polydipsia, polyuria, or new focal neuro s/s.    Pt denies fever, wt loss, night sweats, loss of appetite, or other constitutional symptoms    Wt Readings from Last 3 Encounters:  11/29/23 216 lb (98 kg)  11/24/23 216 lb 6 oz (98.1 kg)  11/14/23 215 lb 11.2 oz (97.8 kg)   BP Readings from Last 3 Encounters:  11/29/23 122/78  11/24/23 (!) 155/89  11/14/23 (!) 151/83   Immunization History  Administered Date(s) Administered   Influenza Inj Mdck Quad Pf 11/18/2020   Influenza, High Dose Seasonal PF 11/07/2019   Influenza, Quadrivalent, Recombinant, Inj, Pf 11/09/2019   Moderna SARS-COV2 Booster Vaccination 10/09/2020   Moderna Sars-Covid-2 Vaccination 01/03/2020, 02/05/2020, 05/23/2020, 10/09/2020   Td 10/25/2000   Tdap 09/02/2011   Health Maintenance Due  Topic Date Due   Pneumococcal Vaccine 70-50 Years old (1 of 2 - PCV) Never done   Zoster Vaccines- Shingrix (1 of 2) Never done   Cervical Cancer Screening (HPV/Pap Cotest)  09/24/2014   DTaP/Tdap/Td (3 - Td or Tdap) 09/01/2021      Past Medical History:  Diagnosis Date   ALLERGIC RHINITIS 11/06/2009   Allergy    Anemia    ANEMIA-IRON DEFICIENCY 11/20/2009   Asthma    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 Lynwood Nasuti   Irreducible umbilical hernia 03/01/2011   Wears glasses    Past  Surgical History:  Procedure Laterality Date   BREAST BIOPSY Left 01/14/2023   US  LT BREAST BX W LOC DEV 1ST LESION IMG BX SPEC US  GUIDE 01/14/2023 [facility]   BREAST BIOPSY  02/10/2023   MM LT RADIOACTIVE SEED LOC MAMMO GUIDE 02/10/2023 [facility]   BREAST LUMPECTOMY WITH RADIOACTIVE SEED AND SENTINEL LYMPH NODE BIOPSY Left 02/14/2023   Procedure: LEFT BREAST LUMPECTOMY WITH RADIOACTIVE SEED AND AXILLARY SENTINEL LYMPH NODE BIOPSY;  Surgeon: Ebbie Cough, MD;  Location: [facility];  Service: General;  Laterality: Left;   PORT-A-CATH REMOVAL N/A 05/31/2023   Procedure: REMOVAL PORT-A-CATH;  Surgeon: Ebbie Cough, MD;  Location: [facility];  Service: General;  Laterality: N/A;   PORTACATH PLACEMENT Right 03/15/2023   Procedure: INSERTION PORT-A-CATH;  Surgeon: Ebbie Cough, MD;  Location: [facility];  Service: General;  Laterality: Right;   UMBILICAL HERNIA REPAIR  05/13/11    reports that she has never smoked. She has never used smokeless tobacco. She reports that she does not drink alcohol and does not use drugs. family history includes Colon polyps in her sister; Diabetes in her father; Esophageal cancer (age of onset: 65) in her maternal uncle; Hyperlipidemia in her mother; Hypertension  in her father; Pancreatic cancer (age of onset: 58) in her cousin; Prostate cancer (age of onset: 35) in her cousin. Allergies  Allergen Reactions   Peanut-Containing Drug Products Anaphylaxis   Soy Allergy (Obsolete) Nausea Only   Egg-Derived Products Other (See Comments)    Upset stomach   Oxycodone  Other (See Comments)    Tongue tingling    Amoxicillin Hives and Rash   Amoxicillin-Pot Clavulanate Rash   Shellfish-Derived Products Itching and Rash   Current Outpatient Medications on File Prior to Visit  Medication Sig Dispense Refill   albuterol (VENTOLIN HFA) 108 (90 Base) MCG/ACT inhaler Inhale 2 puffs into the lungs every 6 (six) hours as needed  for shortness of breath or wheezing.     azelastine (ASTELIN) 0.1 % nasal spray Place 1 spray into both nostrils in the morning.  5   desoximetasone (TOPICORT) 0.25 % cream Apply 1 Application topically 2 (two) times daily as needed (eczema).     diphenhydrAMINE (BENADRYL) 25 MG tablet Take 25 mg by mouth daily as needed (food allergies (with restaurant eating)).     dorzolamide-timolol (COSOPT) 22.3-6.8 MG/ML ophthalmic solution Place 1 drop into both eyes 2 (two) times daily.     EPINEPHrine  0.3 mg/0.3 mL IJ SOAJ injection Inject 0.3 mg into the muscle as needed for anaphylaxis.     fluticasone (FLONASE) 50 MCG/ACT nasal spray Place 2 sprays into both nostrils in the morning.  4   fluticasone-salmeterol (WIXELA INHUB) 250-50 MCG/ACT AEPB Inhale 1 puff into the lungs in the morning.     ketotifen (ZADITOR) 0.035 % ophthalmic solution Place 1-2 drops into both eyes 2 (two) times daily as needed (allergies.).     latanoprost (XALATAN) 0.005 % ophthalmic solution Place 1 drop into both eyes at bedtime.     loratadine (CLARITIN) 10 MG tablet Take 10 mg by mouth in the morning.     tamoxifen  (NOLVADEX ) 20 MG tablet Take 1 tablet (20 mg total) by mouth daily. 90 tablet 3   ferrous sulfate 325 (65 FE) MG tablet Take 325 mg by mouth 3 (three) times a week. (Patient not taking: Reported on 08/25/2023)     No current facility-administered medications on file prior to visit.        ROS:  All others reviewed and negative.  Objective        PE:  BP 122/78 (BP Location: Right Arm, Patient Position: Sitting, Cuff Size: Normal)   Pulse 65   Temp 98.9 F (37.2 C) (Oral)   Ht 5' 1 (1.549 m)   Wt 216 lb (98 kg)   LMP  (LMP Unknown) Comment: she hasnt any periods since having treatments for cancer last year  SpO2 99%   BMI 40.81 kg/m                 Constitutional: Pt appears in NAD               HENT: Head: NCAT.                Right Ear: External ear normal.                 Left Ear: External ear  normal.                Eyes: . Pupils are equal, round, and reactive to light. Conjunctivae and EOM are normal               Nose: without d/c or deformity  Neck: Neck supple. Gross normal ROM               Cardiovascular: Normal rate and regular rhythm.                 Pulmonary/Chest: Effort normal and breath sounds without rales or wheezing.                Abd:  Soft, NT, ND, + BS, no organomegaly               Neurological: Pt is alert. At baseline orientation, motor grossly intact               Skin: Skin is warm. No rashes, no other new lesions, LE edema - none               Psychiatric: Pt behavior is normal without agitation   Micro: none  Cardiac tracings I have personally interpreted today:  none  Pertinent Radiological findings (summarize): none   Lab Results  Component Value Date   WBC 4.4 11/25/2023   HGB 11.9 (L) 11/25/2023   HCT 37.4 11/25/2023   PLT 215.0 11/25/2023   GLUCOSE 87 11/25/2023   CHOL 185 11/25/2023   TRIG 126.0 11/25/2023   HDL 56.10 11/25/2023   LDLDIRECT 141.6 11/06/2009   LDLCALC 103 (H) 11/25/2023   ALT 14 11/25/2023   AST 21 11/25/2023   NA 138 11/25/2023   K 3.9 11/25/2023   CL 104 11/25/2023   CREATININE 0.67 11/25/2023   BUN 13 11/25/2023   CO2 25 11/25/2023   TSH 1.33 11/25/2023   HGBA1C 6.2 11/25/2023   Assessment/Plan:  Sue Beasley is a 53 y.o. Black or African American [2] female with  has a past medical history of ALLERGIC RHINITIS (11/06/2009), Allergy, Anemia, ANEMIA-IRON DEFICIENCY (11/20/2009), Asthma, Cancer (HCC) (12/2022), Eczema, Heart murmur, History of radiation therapy, Irreducible umbilical hernia (03/01/2011), and Wears glasses.  Encounter for well adult exam with abnormal findings Age and sex appropriate education and counseling updated with regular exercise and diet Referrals for preventative services - pt will call to see GYN soon Immunizations addressed - declines all immunizations given other  comorbids Smoking counseling  - none needed Evidence for depression or other mood disorder - none significant Most recent labs reviewed. I have personally reviewed and have noted: 1) the patient's medical and social history 2) The patient's current medications and supplements 3) The patient's height, weight, and BMI have been recorded in the chart   Hyperglycemia Lab Results  Component Value Date   HGBA1C 6.2 11/25/2023   Stable, pt to continue current medical treatment  - diet,wt control   Hyperlipidemia Lab Results  Component Value Date   LDLCALC 103 (H) 11/25/2023   Uncontrolled, pt for lower chol diet, declines statin   Low vitamin D  level Last vitamin D  Lab Results  Component Value Date   VD25OH 10.72 (L) 11/25/2023   Low, to start oral replacement   B12 deficiency Lab Results  Component Value Date   VITAMINB12 220 11/25/2023   Low, to start oral replacement - b12 1000 mcg qd  Followup: Return in about 1 year (around 11/28/2024).  Lynwood Rush, MD 12/03/2023 8:52 PM [facility]

## 2023-11-29 NOTE — Patient Instructions (Signed)
Please continue all other medications as before, and refills have been done if requested.  Please have the pharmacy call with any other refills you may need.  Please continue your efforts at being more active, low cholesterol diet, and weight control.  You are otherwise up to date with prevention measures today.  Please keep your appointments with your specialists as you may have planned  Please make an Appointment to return for your 1 year visit, or sooner if needed, with Lab testing by Appointment as well, to be done about 3-5 days before at the FIRST FLOOR Lab (so this is for TWO appointments - please see the scheduling desk as you leave)    

## 2023-12-03 ENCOUNTER — Encounter: Payer: Self-pay | Admitting: Internal Medicine

## 2023-12-03 NOTE — Assessment & Plan Note (Signed)
 Lab Results  Component Value Date   HGBA1C 6.2 11/25/2023   Stable, pt to continue current medical treatment  - diet,wt control

## 2023-12-03 NOTE — Assessment & Plan Note (Signed)
 Lab Results  Component Value Date   VITAMINB12 220 11/25/2023   Low, to start oral replacement - b12 1000 mcg qd

## 2023-12-03 NOTE — Assessment & Plan Note (Signed)
 Last vitamin D  Lab Results  Component Value Date   VD25OH 10.72 (L) 11/25/2023   Low, to start oral replacement

## 2023-12-03 NOTE — Assessment & Plan Note (Signed)
 Age and sex appropriate education and counseling updated with regular exercise and diet Referrals for preventative services - pt will call to see GYN soon Immunizations addressed - declines all immunizations given other comorbids Smoking counseling  - none needed Evidence for depression or other mood disorder - none significant Most recent labs reviewed. I have personally reviewed and have noted: 1) the patient's medical and social history 2) The patient's current medications and supplements 3) The patient's height, weight, and BMI have been recorded in the chart

## 2023-12-03 NOTE — Assessment & Plan Note (Signed)
 Lab Results  Component Value Date   LDLCALC 103 (H) 11/25/2023   Uncontrolled, pt for lower chol diet, declines statin

## 2023-12-05 ENCOUNTER — Ambulatory Visit: Payer: 59 | Attending: Hematology and Oncology

## 2023-12-05 VITALS — Wt 215.2 lb

## 2023-12-05 DIAGNOSIS — Z9189 Other specified personal risk factors, not elsewhere classified: Secondary | ICD-10-CM | POA: Insufficient documentation

## 2023-12-05 DIAGNOSIS — R293 Abnormal posture: Secondary | ICD-10-CM | POA: Insufficient documentation

## 2023-12-05 DIAGNOSIS — Z170 Estrogen receptor positive status [ER+]: Secondary | ICD-10-CM | POA: Insufficient documentation

## 2023-12-05 DIAGNOSIS — C50412 Malignant neoplasm of upper-outer quadrant of left female breast: Secondary | ICD-10-CM | POA: Insufficient documentation

## 2023-12-05 DIAGNOSIS — I890 Lymphedema, not elsewhere classified: Secondary | ICD-10-CM | POA: Insufficient documentation

## 2023-12-05 DIAGNOSIS — Z483 Aftercare following surgery for neoplasm: Secondary | ICD-10-CM | POA: Insufficient documentation

## 2023-12-05 NOTE — Therapy (Signed)
 OUTPATIENT PHYSICAL THERAPY SOZO SCREENING NOTE   Patient Name: Sue Beasley MRN: [id number] DOB:04/30/1971, 53 y.o., female Today's Date: 12/05/2023  PCP: Roslyn Coombe, MD REFERRING PROVIDER: Cameron Cea, MD   PT End of Session - 12/05/23 1538     Visit Number 2   # unchanged due to screen only   PT Start Time 1536    PT Stop Time 1541    PT Time Calculation (min) 5 min    Activity Tolerance Patient tolerated treatment well    Behavior During Therapy [facility] for tasks assessed/performed             Past Medical History:  Diagnosis Date   ALLERGIC RHINITIS 11/06/2009   Allergy    Anemia    ANEMIA-IRON DEFICIENCY 11/20/2009   Asthma    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 Retta Caster   Irreducible umbilical hernia 03/01/2011   Wears glasses    Past Surgical History:  Procedure Laterality Date   BREAST BIOPSY Left 01/14/2023   US  LT BREAST BX W LOC DEV 1ST LESION IMG BX SPEC US  GUIDE 01/14/2023 [facility]   BREAST BIOPSY  02/10/2023   MM LT RADIOACTIVE SEED LOC MAMMO GUIDE 02/10/2023 [facility]   BREAST LUMPECTOMY WITH RADIOACTIVE SEED AND SENTINEL LYMPH NODE BIOPSY Left 02/14/2023   Procedure: LEFT BREAST LUMPECTOMY WITH RADIOACTIVE SEED AND AXILLARY SENTINEL LYMPH NODE BIOPSY;  Surgeon: Enid Harry, MD;  Location: [facility];  Service: General;  Laterality: Left;   PORT-A-CATH REMOVAL N/A 05/31/2023   Procedure: REMOVAL PORT-A-CATH;  Surgeon: Enid Harry, MD;  Location: [facility];  Service: General;  Laterality: N/A;   PORTACATH PLACEMENT Right 03/15/2023   Procedure: INSERTION PORT-A-CATH;  Surgeon: Enid Harry, MD;  Location: [facility] OR;  Service: General;  Laterality: Right;   UMBILICAL HERNIA REPAIR  05/13/11   Patient Active Problem List   Diagnosis Date Noted   B12 deficiency 11/29/2023   Port-A-Cath  in place 03/23/2023   Genetic testing 02/04/2023   Malignant neoplasm of upper-outer quadrant of left breast in female, estrogen receptor positive (HCC) 01/24/2023   Low vitamin D  level 11/19/2021   Incontinence 12/19/2020   Low mean corpuscular volume (MCV) 11/23/2020   Pain and swelling of wrist, left 03/14/2020   Hyperglycemia 11/09/2019   Uterine leiomyoma 11/09/2019   Acute upper respiratory infection 02/12/2019   Increased body mass index 11/02/2018   Obesity 08/02/2014   Heart murmur, systolic 08/01/2014   Encounter for well adult exam with abnormal findings 03/01/2011   Hyperlipidemia 11/20/2009   Iron deficiency anemia 11/20/2009   Allergic rhinitis 11/06/2009   Asthma 11/06/2009    REFERRING DIAG: left breast cancer at risk for lymphedema  THERAPY DIAG: Aftercare following surgery for neoplasm  PERTINENT HISTORY: 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 It measures 1.1 cm and is located in the upper outer quadrant. It is ER/PR positive HER2 negative with a Ki67 of 70%. left lumpectomy and SLNB on 02/14/23 with oncotype testing (still pending) and radiation. 8 negative nodes removed. 1 aspiration in the axilla of 75ml   PRECAUTIONS: left UE Lymphedema risk, None  SUBJECTIVE: Pt returns for her 3 month L-Dex screen. "My radiation oncologist said I should come to see you guys for my breast lymphedema."  PAIN:  Are you having pain? No  SOZO SCREENING: Patient was assessed today using the SOZO machine to determine the lymphedema index score. This was compared to her baseline score. It was determined that she is within the recommended range when compared to her baseline and no further action is needed at this time. She will continue SOZO screenings. These are done every 3 months for 2 years post operatively followed by every 6 months for 2 years, and then annually.   Patient reported a change in status to PTA which initiated the PTA consulting with a PT. PT  determined it would be appropriate to initiate therapy at this time. PT requested a referral from patient's provider.   L-DEX FLOWSHEETS - 12/05/23 1500       L-DEX LYMPHEDEMA SCREENING   Measurement Type Unilateral    L-DEX MEASUREMENT EXTREMITY Upper Extremity    POSITION  Standing    DOMINANT SIDE Left    At Risk Side Left    BASELINE SCORE (UNILATERAL) -1.6    L-DEX SCORE (UNILATERAL) -2    VALUE CHANGE (UNILAT) -0.4               Denyce Flank, PTA 12/05/2023, 3:40 PM

## 2023-12-05 NOTE — Therapy (Signed)
OUTPATIENT PHYSICAL THERAPY  UPPER EXTREMITY ONCOLOGY EVALUATION  Patient Name: Sue Beasley MRN: [id number] DOB:[birth year], 53 y.o., female Today's Date: 12/06/2023  END OF SESSION:  PT End of Session - 12/06/23 1105     Visit Number 1    Number of Visits 5    Date for PT Re-Evaluation 01/03/24    Authorization Type none needed    PT Start Time 1000    PT Stop Time 1045    PT Time Calculation (min) 45 min    Activity Tolerance Patient tolerated treatment well    Behavior During Therapy [facility] for tasks assessed/performed             Past Medical History:  Diagnosis Date   ALLERGIC RHINITIS 11/06/2009   Allergy    Anemia    ANEMIA-IRON DEFICIENCY 11/20/2009   Asthma    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 Antony Blackbird   Irreducible umbilical hernia 03/01/2011   Wears glasses    Past Surgical History:  Procedure Laterality Date   BREAST BIOPSY Left 01/14/2023   [place] LT BREAST BX W LOC DEV 1ST LESION IMG BX SPEC US GUIDE 01/14/2023 [facility]   BREAST BIOPSY  02/10/2023   MM LT RADIOACTIVE SEED LOC MAMMO GUIDE 02/10/2023 [facility]   BREAST LUMPECTOMY WITH RADIOACTIVE SEED AND SENTINEL LYMPH NODE BIOPSY Left 02/14/2023   Procedure: LEFT BREAST LUMPECTOMY WITH RADIOACTIVE SEED AND AXILLARY SENTINEL LYMPH NODE BIOPSY;  Surgeon: Emelia Loron, MD;  Location: [facility];  Service: General;  Laterality: Left;   PORT-A-CATH REMOVAL N/A 05/31/2023   Procedure: REMOVAL PORT-A-CATH;  Surgeon: Emelia Loron, MD;  Location: [facility];  Service: General;  Laterality: N/A;   PORTACATH PLACEMENT Right 03/15/2023   Procedure: INSERTION PORT-A-CATH;  Surgeon: Emelia Loron, MD;  Location: [facility] OR;  Service: General;  Laterality: Right;   UMBILICAL HERNIA REPAIR  05/13/11   Patient Active Problem List   Diagnosis Date Noted   B12  deficiency 11/29/2023   Port-A-Cath in place 03/23/2023   Genetic testing 02/04/2023   Malignant neoplasm of upper-outer quadrant of left breast in female, estrogen receptor positive (HCC) 01/24/2023   Low vitamin D level 11/19/2021   Incontinence 12/19/2020   Low mean corpuscular volume (MCV) 11/23/2020   Pain and swelling of wrist, left 03/14/2020   Hyperglycemia 11/09/2019   Uterine leiomyoma 11/09/2019   Acute upper respiratory infection 02/12/2019   Increased body mass index 11/02/2018   Obesity 08/02/2014   Heart murmur, systolic 08/01/2014   Encounter for well adult exam with abnormal findings 03/01/2011   Hyperlipidemia 11/20/2009   Iron deficiency anemia 11/20/2009   Allergic rhinitis 11/06/2009   Asthma 11/06/2009    PCP: Oliver Barre, MD  REFERRING PROVIDER: Dr. Pamelia Hoit   REFERRING DIAG: Left breast swelling   THERAPY DIAG:  Aftercare following surgery for neoplasm  Malignant neoplasm of upper-outer quadrant of left breast in female, estrogen receptor positive (HCC)  Lymphedema, not elsewhere classified  ONSET DATE: 01/24/23  Rationale for Evaluation and Treatment: Rehabilitation  SUBJECTIVE:  SUBJECTIVE STATEMENT: I started to notice is was swollen and tight after radiation.  They thought it was just post radiation swelling.  They told me to just do my exercises and massage it.  Then I started back to running club and walking more and I am starting to notice more hardness and print on the inside of the breast.  She told me to start to wearing the compression bra yesterday.    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 It measures 1.1 cm and is located in the upper outer quadrant. It is ER/PR positive HER2 negative with a Ki67 of 70%. left lumpectomy and  SLNB on 02/14/23 with completion of chemotherapy with TC and radiation. 8 negative nodes removed. 1 aspiration in the axilla of 75ml   PAIN:  Are you having pain? No.  It usually doesn't hurt - I just get the twinges   PRECAUTIONS: None  RED FLAGS: None   WEIGHT BEARING RESTRICTIONS: No  FALLS:  Has patient fallen in last 6 months? No  LIVING ENVIRONMENT: Lives with: lives alone  OCCUPATION: math professor   LEISURE: running club - starting to train for a 5K   HAND DOMINANCE: right   PRIOR LEVEL OF FUNCTION: Independent  PATIENT GOALS: What do I do for the swelling    OBJECTIVE: Note: Objective measures were completed at Evaluation unless otherwise noted.  COGNITION: Overall cognitive status: Within functional limits for tasks assessed   PALPATION: Fibrosis medial and inferior breast, scar tissue lateral breast, congestion axilla   OBSERVATIONS / OTHER ASSESSMENTS: wearing compression bra as of yesterday, peau de orange skin medial breast, birth mark on anterior upper breast, signs of desquamation under the breast from radiation now healed   UPPER EXTREMITY AROM/PROM: ALL WNL but some tightness lateral breast with all   UPPER EXTREMITY STRENGTH: WNL  L-DEX LYMPHEDEMA SCREENING:  BREAST COMPLAINTS QUESTIONNAIRE    12/06/23 Pain:   1 Heaviness:  6 Swollen feeling: 6 Tense Skin:  1 Redness:  1 Bra Print:  9  Size of Pores:  6 Hard feeling:   9 Total:   39 /80 A Score over 9 indicates lymphedema issues in the breast                                                                                                                             TREATMENT DATE:  12/06/23 Eval performed With pt permission for breast MLD Education on lymphatic anatomy and drainage patterns of the breast and principles of MLD Performed each step in supine per instruction section below with PT reading and then performing each step and then with hand over hand cueing and performance by pt  with modification as needed - noted below.  Made pt foam chip pack for use in fibrotic breast locations with instruction on using 63min-1 hour to start and then as needed  PATIENT EDUCATION:  Education details: per today's note Person educated:  Patient Education method: Explanation, Demonstration, Tactile cues, Verbal cues, and Handouts Education comprehension: verbalized understanding, returned demonstration, verbal cues required, tactile cues required, and needs further education  HOME EXERCISE PROGRAM: Start self MLD - added to instruction section 12/06/23  ASSESSMENT:  CLINICAL IMPRESSION: Patient is a 53 y.o. female who was seen today for physical therapy evaluation and treatment for her lt breast lymphedema.  She noticed some general edema post radiation that has lingered and is now starting to be a bit fibrotic.  No pain - just notes hardness in the breast and prints from her bra.  The breast is moderately swollen with mild fibrosis and should respond well to treatment.      OBJECTIVE IMPAIRMENTS: decreased knowledge of condition, decreased knowledge of use of DME, increased edema, and increased fascial restrictions.   ACTIVITY LIMITATIONS: none  PARTICIPATION LIMITATIONS: none  PERSONAL FACTORS: 1-2 comorbidities: SLNB, radiation hx  are also affecting patient's functional outcome.   REHAB POTENTIAL: Excellent  CLINICAL DECISION MAKING: Stable/uncomplicated  EVALUATION COMPLEXITY: Low  GOALS: Goals reviewed with patient? Yes  SHORT TERM GOALS=LTGs: Target date: 01/03/24  Pt will be ind with breast MLD for edema reduction Baseline: Goal status: INITIAL  2.  Pt will decrease breast edema questionnaire to 15/80 or less Baseline: 39 Goal status: INITIAL  PLAN:  PT FREQUENCY: 1x/week  PT DURATION: 4 weeks after eval  PLANNED INTERVENTIONS: 97164- PT Re-evaluation, 97110-Therapeutic exercises, 97535- Self Care, 96295- Manual therapy, Patient/Family education,  Therapeutic exercises, Therapeutic activity, Neuromuscular re-education, and Self Care  PLAN FOR NEXT SESSION: review LT breast MLD - taught full sequence  Idamae Lusher, PT 12/06/2023, 11:06 AM

## 2023-12-06 ENCOUNTER — Other Ambulatory Visit: Payer: Self-pay

## 2023-12-06 ENCOUNTER — Ambulatory Visit: Payer: 59 | Attending: Hematology and Oncology | Admitting: Rehabilitation

## 2023-12-06 ENCOUNTER — Encounter: Payer: Self-pay | Admitting: *Deleted

## 2023-12-06 ENCOUNTER — Encounter: Payer: Self-pay | Admitting: Rehabilitation

## 2023-12-06 DIAGNOSIS — Z483 Aftercare following surgery for neoplasm: Secondary | ICD-10-CM

## 2023-12-06 DIAGNOSIS — Z170 Estrogen receptor positive status [ER+]: Secondary | ICD-10-CM | POA: Insufficient documentation

## 2023-12-06 DIAGNOSIS — I890 Lymphedema, not elsewhere classified: Secondary | ICD-10-CM

## 2023-12-06 DIAGNOSIS — C50412 Malignant neoplasm of upper-outer quadrant of left female breast: Secondary | ICD-10-CM

## 2023-12-06 NOTE — Patient Instructions (Signed)
Manual Lymph Drainage for Left Breast.   Do daily.  Do slowly. Use flat hands with just enough pressure to stretch the skin. Do not slide over the skin, stretch the skin with the hand. (Stretch  Relax  Move) Lie down or sit comfortably (in a recliner, for example) to do this.   Do circles at each collar bone near the neck 5-7 times (to "wake up" lots of lymph nodes in this area).  Take slow deep breaths, allowing your belly to balloon out as you breathe in, 5x (to "wake up" abdominal lymph nodes).  Do circles in both armpits--stretch skin in small circles to stimulate intact lymph nodes there, 5-7x.  Do Circles in the left groin area, at panty line--stretch skin to stimulate lymph nodes 5-7x.  Redirect fluid from left chest toward right armpit (stretch skin starting at left chest in 3-4 spots working toward right armpit) 3-4x across the chest.  Redirect fluid from left armpit toward left groin (cup your hand around the curve of your left side and do 3-4 "pumps" from armpit to groin) 3-4x down your side.  Draw an imaginary diagonal line from upper outer breast through the nipple area toward lower inner breast.  Direct fluid above this line upward and inward toward the pathway across your chest (established in #5).  Then direct the fluid below this line down and out toward the pathway down your side going towards the left groin (established in #6). Do this for a few minutes or until you feel any improvements.   Then repeat your pathways 2-3x  (#5 and #6)  End with repeating #3 and #4 above  (circles in both armpits and the left groin)

## 2023-12-13 ENCOUNTER — Ambulatory Visit: Payer: 59

## 2023-12-13 DIAGNOSIS — I890 Lymphedema, not elsewhere classified: Secondary | ICD-10-CM

## 2023-12-13 DIAGNOSIS — Z170 Estrogen receptor positive status [ER+]: Secondary | ICD-10-CM

## 2023-12-13 DIAGNOSIS — Z483 Aftercare following surgery for neoplasm: Secondary | ICD-10-CM

## 2023-12-13 NOTE — Therapy (Signed)
OUTPATIENT PHYSICAL THERAPY  UPPER EXTREMITY ONCOLOGY TREATMENT  Patient Name: Sue Beasley MRN: [id number] DOB:1971-08-07, 53 y.o., female Today's Date: 12/13/2023  END OF SESSION:  PT End of Session - 12/13/23 0912     Visit Number 2    Number of Visits 5    Date for PT Re-Evaluation 01/03/24    PT Start Time 0904    PT Stop Time 1004    PT Time Calculation (min) 60 min    Activity Tolerance Patient tolerated treatment well    Behavior During Therapy [facility] for tasks assessed/performed             Past Medical History:  Diagnosis Date   ALLERGIC RHINITIS 11/06/2009   Allergy    Anemia    ANEMIA-IRON DEFICIENCY 11/20/2009   Asthma    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 Antony Blackbird   Irreducible umbilical hernia 03/01/2011   Wears glasses    Past Surgical History:  Procedure Laterality Date   BREAST BIOPSY Left 01/14/2023   [place] LT BREAST BX W LOC DEV 1ST LESION IMG BX SPEC US GUIDE 01/14/2023 [facility]   BREAST BIOPSY  02/10/2023   MM LT RADIOACTIVE SEED LOC MAMMO GUIDE 02/10/2023 [facility]   BREAST LUMPECTOMY WITH RADIOACTIVE SEED AND SENTINEL LYMPH NODE BIOPSY Left 02/14/2023   Procedure: LEFT BREAST LUMPECTOMY WITH RADIOACTIVE SEED AND AXILLARY SENTINEL LYMPH NODE BIOPSY;  Surgeon: Emelia Loron, MD;  Location: [facility];  Service: General;  Laterality: Left;   PORT-A-CATH REMOVAL N/A 05/31/2023   Procedure: REMOVAL PORT-A-CATH;  Surgeon: Emelia Loron, MD;  Location: [facility];  Service: General;  Laterality: N/A;   PORTACATH PLACEMENT Right 03/15/2023   Procedure: INSERTION PORT-A-CATH;  Surgeon: Emelia Loron, MD;  Location: [facility] OR;  Service: General;  Laterality: Right;   UMBILICAL HERNIA REPAIR  05/13/11   Patient Active Problem List   Diagnosis Date Noted   B12 deficiency 11/29/2023   Port-A-Cath  in place 03/23/2023   Genetic testing 02/04/2023   Malignant neoplasm of upper-outer quadrant of left breast in female, estrogen receptor positive (HCC) 01/24/2023   Low vitamin D level 11/19/2021   Incontinence 12/19/2020   Low mean corpuscular volume (MCV) 11/23/2020   Pain and swelling of wrist, left 03/14/2020   Hyperglycemia 11/09/2019   Uterine leiomyoma 11/09/2019   Acute upper respiratory infection 02/12/2019   Increased body mass index 11/02/2018   Obesity 08/02/2014   Heart murmur, systolic 08/01/2014   Encounter for well adult exam with abnormal findings 03/01/2011   Hyperlipidemia 11/20/2009   Iron deficiency anemia 11/20/2009   Allergic rhinitis 11/06/2009   Asthma 11/06/2009    PCP: Oliver Barre, MD  REFERRING PROVIDER: Dr. Pamelia Hoit   REFERRING DIAG: Left breast swelling   THERAPY DIAG:  Aftercare following surgery for neoplasm  Malignant neoplasm of upper-outer quadrant of left breast in female, estrogen receptor positive (HCC)  Lymphedema, not elsewhere classified  ONSET DATE: 01/24/23  Rationale for Evaluation and Treatment: Rehabilitation  SUBJECTIVE:  SUBJECTIVE STATEMENT:  I played at [employer] Saturday so my Lt axilla is a little sore just from swinging a club. I've been using the chip pack she made me last time but I can't tell if it's helping yet or not.    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 It measures 1.1 cm and is located in the upper outer quadrant. It is ER/PR positive HER2 negative with a Ki67 of 70%. left lumpectomy and SLNB on 02/14/23 with completion of chemotherapy with TC and radiation. 8 negative nodes removed. 1 aspiration in the axilla of 75ml   PAIN:  Are you having pain? No, just a little sore near axilla   PRECAUTIONS:  None  RED FLAGS: None   WEIGHT BEARING RESTRICTIONS: No  FALLS:  Has patient fallen in last 6 months? No  LIVING ENVIRONMENT: Lives with: lives alone  OCCUPATION: math professor   LEISURE: running club - starting to train for a 5K   HAND DOMINANCE: right   PRIOR LEVEL OF FUNCTION: Independent  PATIENT GOALS: What do I do for the swelling    OBJECTIVE: Note: Objective measures were completed at Evaluation unless otherwise noted.  COGNITION: Overall cognitive status: Within functional limits for tasks assessed   PALPATION: Fibrosis medial and inferior breast, scar tissue lateral breast, congestion axilla   OBSERVATIONS / OTHER ASSESSMENTS: wearing compression bra as of yesterday, peau de orange skin medial breast, birth mark on anterior upper breast, signs of desquamation under the breast from radiation now healed   UPPER EXTREMITY AROM/PROM: ALL WNL but some tightness lateral breast with all   UPPER EXTREMITY STRENGTH: WNL  L-DEX LYMPHEDEMA SCREENING:  BREAST COMPLAINTS QUESTIONNAIRE    12/06/23 Pain:   1 Heaviness:  6 Swollen feeling: 6 Tense Skin:  1 Redness:  1 Bra Print:  9  Size of Pores:  6 Hard feeling:   9 Total:   39 /80 A Score over 9 indicates lymphedema issues in the breast                                                                                                                             TREATMENT DATE:  12/13/23: Self Care Educated pt about basics of anatomy and principles of MLD, also used visual diagram. Encouraged pt to wear her compression bra as much as tolerated as well to further help reduce lymphedema.  Manual Therapy MLD in supine to Lt breast: Short neck, 5 diaphragmatic breaths, Lt inguinal nodes, Lt axillo-inguinal anastomosis, Rt axillary and pectoral nodes, Rt intact anterior thorax sequence, anterior inter-axillary anastomosis, then focused on medial Lt breast, then into Rt S/L for focus to lateral breast redirecting towards  lateral anastomosis, then finished retracing all steps in supine. Had pt start to return demo for each step. Hand over hand cuing for correct pressure and skin stretch.   12/06/23 Eval performed With pt permission for breast MLD Education on lymphatic anatomy  and drainage patterns of the breast and principles of MLD Performed each step in supine per instruction section below with PT reading and then performing each step and then with hand over hand cueing and performance by pt with modification as needed - noted below.  Made pt foam chip pack for use in fibrotic breast locations with instruction on using 60min-1 hour to start and then as needed  PATIENT EDUCATION:  Education details: per today's note Person educated: Patient Education method: Explanation, Demonstration, Tactile cues, Verbal cues, and Handouts Education comprehension: verbalized understanding, returned demonstration, verbal cues required, tactile cues required, and needs further education  HOME EXERCISE PROGRAM: Start self MLD - added to instruction section 12/06/23  ASSESSMENT:  CLINICAL IMPRESSION: Continued with breast MLD and began instructing pt in this. Issued handout and had pt return demo. Also encouraged her to wear her compression bra as much as able.    OBJECTIVE IMPAIRMENTS: decreased knowledge of condition, decreased knowledge of use of DME, increased edema, and increased fascial restrictions.   ACTIVITY LIMITATIONS: none  PARTICIPATION LIMITATIONS: none  PERSONAL FACTORS: 1-2 comorbidities: SLNB, radiation hx  are also affecting patient's functional outcome.   REHAB POTENTIAL: Excellent  CLINICAL DECISION MAKING: Stable/uncomplicated  EVALUATION COMPLEXITY: Low  GOALS: Goals reviewed with patient? Yes  SHORT TERM GOALS=LTGs: Target date: 01/03/24  Pt will be ind with breast MLD for edema reduction Baseline: Goal status: INITIAL  2.  Pt will decrease breast edema questionnaire to 15/80 or  less Baseline: 39 Goal status: INITIAL  PLAN:  PT FREQUENCY: 1x/week  PT DURATION: 4 weeks after eval  PLANNED INTERVENTIONS: 97164- PT Re-evaluation, 97110-Therapeutic exercises, 97535- Self Care, 16109- Manual therapy, Patient/Family education, Therapeutic exercises, Therapeutic activity, Neuromuscular re-education, and Self Care  PLAN FOR NEXT SESSION: Cont and review LT breast MLD; wearing compression bra more?   Hermenia Bers, PTA 12/13/2023, 12:31 PM

## 2023-12-20 ENCOUNTER — Encounter: Payer: Self-pay | Admitting: Rehabilitation

## 2023-12-20 ENCOUNTER — Ambulatory Visit: Payer: 59 | Admitting: Rehabilitation

## 2023-12-20 DIAGNOSIS — Z483 Aftercare following surgery for neoplasm: Secondary | ICD-10-CM | POA: Diagnosis present

## 2023-12-20 DIAGNOSIS — Z9189 Other specified personal risk factors, not elsewhere classified: Secondary | ICD-10-CM

## 2023-12-20 DIAGNOSIS — I890 Lymphedema, not elsewhere classified: Secondary | ICD-10-CM

## 2023-12-20 DIAGNOSIS — C50412 Malignant neoplasm of upper-outer quadrant of left female breast: Secondary | ICD-10-CM | POA: Diagnosis present

## 2023-12-20 DIAGNOSIS — Z170 Estrogen receptor positive status [ER+]: Secondary | ICD-10-CM | POA: Diagnosis present

## 2023-12-20 DIAGNOSIS — R293 Abnormal posture: Secondary | ICD-10-CM

## 2023-12-20 NOTE — Therapy (Signed)
 OUTPATIENT PHYSICAL THERAPY  UPPER EXTREMITY ONCOLOGY TREATMENT  Patient Name: Sue Beasley MRN: [id number] DOB:[birth year], 53 y.o., female Today's Date: 12/20/2023  END OF SESSION:  PT End of Session - 12/20/23 0850     Visit Number 3    Number of Visits 5    Date for PT Re-Evaluation 01/03/24    PT Start Time 0804    PT Stop Time 0850    PT Time Calculation (min) 46 min    Activity Tolerance Patient tolerated treatment well    Behavior During Therapy [facility] for tasks assessed/performed              Past Medical History:  Diagnosis Date   ALLERGIC RHINITIS 11/06/2009   Allergy    Anemia    ANEMIA-IRON DEFICIENCY 11/20/2009   Asthma    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 Antony Blackbird   Irreducible umbilical hernia 03/01/2011   Wears glasses    Past Surgical History:  Procedure Laterality Date   BREAST BIOPSY Left 01/14/2023   [place] LT BREAST BX W LOC DEV 1ST LESION IMG BX SPEC US GUIDE 01/14/2023 [facility]   BREAST BIOPSY  02/10/2023   MM LT RADIOACTIVE SEED LOC MAMMO GUIDE 02/10/2023 [facility]   BREAST LUMPECTOMY WITH RADIOACTIVE SEED AND SENTINEL LYMPH NODE BIOPSY Left 02/14/2023   Procedure: LEFT BREAST LUMPECTOMY WITH RADIOACTIVE SEED AND AXILLARY SENTINEL LYMPH NODE BIOPSY;  Surgeon: Emelia Loron, MD;  Location: [facility];  Service: General;  Laterality: Left;   PORT-A-CATH REMOVAL N/A 05/31/2023   Procedure: REMOVAL PORT-A-CATH;  Surgeon: Emelia Loron, MD;  Location: [facility];  Service: General;  Laterality: N/A;   PORTACATH PLACEMENT Right 03/15/2023   Procedure: INSERTION PORT-A-CATH;  Surgeon: Emelia Loron, MD;  Location: [facility] OR;  Service: General;  Laterality: Right;   UMBILICAL HERNIA REPAIR  05/13/11   Patient Active Problem List   Diagnosis Date Noted   B12 deficiency 11/29/2023   Port-A-Cath  in place 03/23/2023   Genetic testing 02/04/2023   Malignant neoplasm of upper-outer quadrant of left breast in female, estrogen receptor positive (HCC) 01/24/2023   Low vitamin D level 11/19/2021   Incontinence 12/19/2020   Low mean corpuscular volume (MCV) 11/23/2020   Pain and swelling of wrist, left 03/14/2020   Hyperglycemia 11/09/2019   Uterine leiomyoma 11/09/2019   Acute upper respiratory infection 02/12/2019   Increased body mass index 11/02/2018   Obesity 08/02/2014   Heart murmur, systolic 08/01/2014   Encounter for well adult exam with abnormal findings 03/01/2011   Hyperlipidemia 11/20/2009   Iron deficiency anemia 11/20/2009   Allergic rhinitis 11/06/2009   Asthma 11/06/2009    PCP: Oliver Barre, MD  REFERRING PROVIDER: Dr. Pamelia Hoit   REFERRING DIAG: Left breast swelling   THERAPY DIAG:  Aftercare following surgery for neoplasm  Malignant neoplasm of upper-outer quadrant of left breast in female, estrogen receptor positive (HCC)  Lymphedema, not elsewhere classified  At risk for lymphedema  Abnormal posture  ONSET DATE: 01/24/23  Rationale for Evaluation and Treatment: Rehabilitation  SUBJECTIVE:  SUBJECTIVE STATEMENT:  I feel like I can do it okay.    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 It measures 1.1 cm and is located in the upper outer quadrant. It is ER/PR positive HER2 negative with a Ki67 of 70%. left lumpectomy and SLNB on 02/14/23 with completion of chemotherapy with TC and radiation. 8 negative nodes removed. 1 aspiration in the axilla of 75ml   PAIN:  Are you having pain? No, just a little sore near axilla   PRECAUTIONS: None  RED FLAGS: None   WEIGHT BEARING RESTRICTIONS: No  FALLS:  Has patient fallen in last 6 months?  No  LIVING ENVIRONMENT: Lives with: lives alone  OCCUPATION: math professor   LEISURE: running club - starting to train for a 5K   HAND DOMINANCE: right   PRIOR LEVEL OF FUNCTION: Independent  PATIENT GOALS: What do I do for the swelling    OBJECTIVE: Note: Objective measures were completed at Evaluation unless otherwise noted.  COGNITION: Overall cognitive status: Within functional limits for tasks assessed   PALPATION: Fibrosis medial and inferior breast, scar tissue lateral breast, congestion axilla   OBSERVATIONS / OTHER ASSESSMENTS: wearing compression bra as of yesterday, peau de orange skin medial breast, birth mark on anterior upper breast, signs of desquamation under the breast from radiation now healed   UPPER EXTREMITY AROM/PROM: ALL WNL but some tightness lateral breast with all   UPPER EXTREMITY STRENGTH: WNL  L-DEX LYMPHEDEMA SCREENING:  BREAST COMPLAINTS QUESTIONNAIRE    12/06/23 Pain:   1 Heaviness:  6 Swollen feeling: 6 Tense Skin:  1 Redness:  1 Bra Print:  9  Size of Pores:  6 Hard feeling:   9 Total:   39 /80 A Score over 9 indicates lymphedema issues in the breast                                                                                                                             TREATMENT DATE:  12/20/23 Manual Therapy MLD in supine to Lt breast: Short neck, 5 diaphragmatic breaths, Lt inguinal nodes, Lt axillo-inguinal anastomosis, Rt axillary and pectoral nodes, Rt intact anterior thorax sequence, anterior inter-axillary anastomosis, then focused on medial Lt breast, then into Rt S/L for focus to lateral breast redirecting towards lateral anastomosis, then finished retracing all steps in supine.   12/13/23: Self Care Educated pt about basics of anatomy and principles of MLD, also used visual diagram. Encouraged pt to wear her compression bra as much as tolerated as well to further help reduce lymphedema.  Manual Therapy MLD in supine  to Lt breast: Short neck, 5 diaphragmatic breaths, Lt inguinal nodes, Lt axillo-inguinal anastomosis, Rt axillary and pectoral nodes, Rt intact anterior thorax sequence, anterior inter-axillary anastomosis, then focused on medial Lt breast, then into Rt S/L for focus to lateral breast redirecting towards lateral anastomosis, then finished retracing all steps in supine. Had pt start to return  demo for each step. Hand over hand cuing for correct pressure and skin stretch.  12/06/23 Eval performed With pt permission for breast MLD Education on lymphatic anatomy and drainage patterns of the breast and principles of MLD Performed each step in supine per instruction section below with PT reading and then performing each step and then with hand over hand cueing and performance by pt with modification as needed - noted below.  Made pt foam chip pack for use in fibrotic breast locations with instruction on using 50min-1 hour to start and then as needed  PATIENT EDUCATION:  Education details: per today's note Person educated: Patient Education method: Explanation, Demonstration, Tactile cues, Verbal cues, and Handouts Education comprehension: verbalized understanding, returned demonstration, verbal cues required, tactile cues required, and needs further education  HOME EXERCISE PROGRAM: Start self MLD - added to instruction section 12/06/23  ASSESSMENT:  CLINICAL IMPRESSION: Continued with breast MLD noting mild medial breast fibrosis.   OBJECTIVE IMPAIRMENTS: decreased knowledge of condition, decreased knowledge of use of DME, increased edema, and increased fascial restrictions.   ACTIVITY LIMITATIONS: none  PARTICIPATION LIMITATIONS: none  PERSONAL FACTORS: 1-2 comorbidities: SLNB, radiation hx  are also affecting patient's functional outcome.   REHAB POTENTIAL: Excellent  CLINICAL DECISION MAKING: Stable/uncomplicated  EVALUATION COMPLEXITY: Low  GOALS: Goals reviewed with patient?  Yes  SHORT TERM GOALS=LTGs: Target date: 01/03/24  Pt will be ind with breast MLD for edema reduction Baseline: Goal status: INITIAL  2.  Pt will decrease breast edema questionnaire to 15/80 or less Baseline: 39 Goal status: INITIAL  PLAN:  PT FREQUENCY: 1x/week  PT DURATION: 4 weeks after eval  PLANNED INTERVENTIONS: 97164- PT Re-evaluation, 97110-Therapeutic exercises, 97535- Self Care, [id number]- Manual therapy, Patient/Family education, Therapeutic exercises, Therapeutic activity, Neuromuscular re-education, and Self Care  PLAN FOR NEXT SESSION: Cont and review LT breast MLD; wearing compression bra more?   Idamae Lusher, PT 12/20/2023, 8:50 AM

## 2023-12-22 ENCOUNTER — Telehealth: Payer: Self-pay | Admitting: *Deleted

## 2023-12-22 NOTE — Telephone Encounter (Signed)
 Received call from pt with complaint of left sided ear/ temporal area headache x 2 weeks.  Pt states she changed out her pillow and adjusted her glasses to see if that works but has not experience any relief.  Pt also states Benadryl OTC gradually alleviates the ache but does not completely eliminate it.  Pt states she recently starting receiving allergy shots and is unsure if headaches are related to that or Tamoxifen.  Per MD pt to stop taking Tamoxifen to see if symptoms diminish.  Pt educated to contact our office in two weeks for an update.

## 2023-12-26 ENCOUNTER — Encounter: Payer: 59 | Admitting: Adult Health

## 2023-12-27 ENCOUNTER — Ambulatory Visit: Payer: 59 | Attending: Hematology and Oncology

## 2023-12-27 DIAGNOSIS — I890 Lymphedema, not elsewhere classified: Secondary | ICD-10-CM

## 2023-12-27 DIAGNOSIS — C50412 Malignant neoplasm of upper-outer quadrant of left female breast: Secondary | ICD-10-CM | POA: Diagnosis present

## 2023-12-27 DIAGNOSIS — Z9189 Other specified personal risk factors, not elsewhere classified: Secondary | ICD-10-CM | POA: Diagnosis present

## 2023-12-27 DIAGNOSIS — R293 Abnormal posture: Secondary | ICD-10-CM | POA: Diagnosis present

## 2023-12-27 DIAGNOSIS — Z170 Estrogen receptor positive status [ER+]: Secondary | ICD-10-CM | POA: Diagnosis present

## 2023-12-27 DIAGNOSIS — Z483 Aftercare following surgery for neoplasm: Secondary | ICD-10-CM

## 2023-12-27 NOTE — Therapy (Signed)
 OUTPATIENT PHYSICAL THERAPY  UPPER EXTREMITY ONCOLOGY TREATMENT  Patient Name: Sue Beasley MRN: [id number] DOB:1971/09/29, 53 y.o., female Today's Date: 12/27/2023  END OF SESSION:  PT End of Session - 12/27/23 0919     Visit Number 4    Number of Visits 5    Date for PT Re-Evaluation 01/03/24    PT Start Time 0907    PT Stop Time 1003    PT Time Calculation (min) 56 min    Activity Tolerance Patient tolerated treatment well    Behavior During Therapy [facility] for tasks assessed/performed              Past Medical History:  Diagnosis Date   ALLERGIC RHINITIS 11/06/2009   Allergy    Anemia    ANEMIA-IRON DEFICIENCY 11/20/2009   Asthma    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 Antony Blackbird   Irreducible umbilical hernia 03/01/2011   Wears glasses    Past Surgical History:  Procedure Laterality Date   BREAST BIOPSY Left 01/14/2023   [place] LT BREAST BX W LOC DEV 1ST LESION IMG BX SPEC US GUIDE 01/14/2023 [facility]   BREAST BIOPSY  02/10/2023   MM LT RADIOACTIVE SEED LOC MAMMO GUIDE 02/10/2023 [facility]   BREAST LUMPECTOMY WITH RADIOACTIVE SEED AND SENTINEL LYMPH NODE BIOPSY Left 02/14/2023   Procedure: LEFT BREAST LUMPECTOMY WITH RADIOACTIVE SEED AND AXILLARY SENTINEL LYMPH NODE BIOPSY;  Surgeon: Emelia Loron, MD;  Location: [facility];  Service: General;  Laterality: Left;   PORT-A-CATH REMOVAL N/A 05/31/2023   Procedure: REMOVAL PORT-A-CATH;  Surgeon: Emelia Loron, MD;  Location: [facility];  Service: General;  Laterality: N/A;   PORTACATH PLACEMENT Right 03/15/2023   Procedure: INSERTION PORT-A-CATH;  Surgeon: Emelia Loron, MD;  Location: [facility] OR;  Service: General;  Laterality: Right;   UMBILICAL HERNIA REPAIR  05/13/11   Patient Active Problem List   Diagnosis Date Noted   B12 deficiency 11/29/2023   Port-A-Cath  in place 03/23/2023   Genetic testing 02/04/2023   Malignant neoplasm of upper-outer quadrant of left breast in female, estrogen receptor positive (HCC) 01/24/2023   Low vitamin D level 11/19/2021   Incontinence 12/19/2020   Low mean corpuscular volume (MCV) 11/23/2020   Pain and swelling of wrist, left 03/14/2020   Hyperglycemia 11/09/2019   Uterine leiomyoma 11/09/2019   Acute upper respiratory infection 02/12/2019   Increased body mass index 11/02/2018   Obesity 08/02/2014   Heart murmur, systolic 08/01/2014   Encounter for well adult exam with abnormal findings 03/01/2011   Hyperlipidemia 11/20/2009   Iron deficiency anemia 11/20/2009   Allergic rhinitis 11/06/2009   Asthma 11/06/2009    PCP: Oliver Barre, MD  REFERRING PROVIDER: Dr. Pamelia Hoit   REFERRING DIAG: Left breast swelling   THERAPY DIAG:  Aftercare following surgery for neoplasm  Malignant neoplasm of upper-outer quadrant of left breast in female, estrogen receptor positive (HCC)  Lymphedema, not elsewhere classified  At risk for lymphedema  Abnormal posture  ONSET DATE: 01/24/23  Rationale for Evaluation and Treatment: Rehabilitation  SUBJECTIVE:  SUBJECTIVE STATEMENT:  I can tell I am doing better. The numbness at the back of the arm is getting better. I've started having some shooting pains into my breast but they just come and go.   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 It measures 1.1 cm and is located in the upper outer quadrant. It is ER/PR positive HER2 negative with a Ki67 of 70%. left lumpectomy and SLNB on 02/14/23 with completion of chemotherapy with TC and radiation. 8 negative nodes removed. 1 aspiration in the axilla of 75ml   PAIN:  Are you having pain? No  PRECAUTIONS:  None  RED FLAGS: None   WEIGHT BEARING RESTRICTIONS: No  FALLS:  Has patient fallen in last 6 months? No  LIVING ENVIRONMENT: Lives with: lives alone  OCCUPATION: math professor   LEISURE: running club - starting to train for a 5K   HAND DOMINANCE: right   PRIOR LEVEL OF FUNCTION: Independent  PATIENT GOALS: What do I do for the swelling    OBJECTIVE: Note: Objective measures were completed at Evaluation unless otherwise noted.  COGNITION: Overall cognitive status: Within functional limits for tasks assessed   PALPATION: Fibrosis medial and inferior breast, scar tissue lateral breast, congestion axilla   OBSERVATIONS / OTHER ASSESSMENTS: wearing compression bra as of yesterday, peau de orange skin medial breast, birth mark on anterior upper breast, signs of desquamation under the breast from radiation now healed   UPPER EXTREMITY AROM/PROM: ALL WNL but some tightness lateral breast with all   UPPER EXTREMITY STRENGTH: WNL  L-DEX LYMPHEDEMA SCREENING:  BREAST COMPLAINTS QUESTIONNAIRE    12/06/23 Pain:   1 Heaviness:  6 Swollen feeling: 6 Tense Skin:  1 Redness:  1 Bra Print:  9  Size of Pores:  6 Hard feeling:   9 Total:   39 /80 A Score over 9 indicates lymphedema issues in the breast                                                                                                                             TREATMENT DATE:  12/27/23: Manual Therapy MLD in supine to Lt breast: Short neck, superficial and deep abdominals, Lt inguinal nodes, Lt axillo-inguinal anastomosis, Rt axillary and pectoral nodes, Rt intact anterior thorax sequence, anterior inter-axillary anastomosis, then focused on medial Lt breast, then into Rt S/L for focus to lateral breast redirecting towards lateral anastomosis, then finished retracing all steps in supine.   12/20/23 Manual Therapy MLD in supine to Lt breast: Short neck, 5 diaphragmatic breaths, Lt inguinal nodes, Lt  axillo-inguinal anastomosis, Rt axillary and pectoral nodes, Rt intact anterior thorax sequence, anterior inter-axillary anastomosis, then focused on medial Lt breast, then into Rt S/L for focus to lateral breast redirecting towards lateral anastomosis, then finished retracing all steps in supine.   12/13/23: Self Care Educated pt about basics of anatomy and principles of MLD, also used visual diagram. Encouraged pt to  wear her compression bra as much as tolerated as well to further help reduce lymphedema.  Manual Therapy MLD in supine to Lt breast: Short neck, 5 diaphragmatic breaths, Lt inguinal nodes, Lt axillo-inguinal anastomosis, Rt axillary and pectoral nodes, Rt intact anterior thorax sequence, anterior inter-axillary anastomosis, then focused on medial Lt breast, then into Rt S/L for focus to lateral breast redirecting towards lateral anastomosis, then finished retracing all steps in supine. Had pt start to return demo for each step. Hand over hand cuing for correct pressure and skin stretch.    PATIENT EDUCATION:  Education details: per today's note Person educated: Patient Education method: Programmer, multimedia, Demonstration, Tactile cues, Verbal cues, and Handouts Education comprehension: verbalized understanding, returned demonstration, verbal cues required, tactile cues required, and needs further education  HOME EXERCISE PROGRAM: Start self MLD - added to instruction section 12/06/23  ASSESSMENT:  CLINICAL IMPRESSION: Continued with Lt breast MLD noting good overall softening of the breast since start of care. During session answered pts questions about current functional status regarding intermittent shooting pains into her breast and that this is most likely normal nerve pain from healing. Pt verbalized understanding.   OBJECTIVE IMPAIRMENTS: decreased knowledge of condition, decreased knowledge of use of DME, increased edema, and increased fascial restrictions.   ACTIVITY  LIMITATIONS: none  PARTICIPATION LIMITATIONS: none  PERSONAL FACTORS: 1-2 comorbidities: SLNB, radiation hx  are also affecting patient's functional outcome.   REHAB POTENTIAL: Excellent  CLINICAL DECISION MAKING: Stable/uncomplicated  EVALUATION COMPLEXITY: Low  GOALS: Goals reviewed with patient? Yes  SHORT TERM GOALS=LTGs: Target date: 01/03/24  Pt will be ind with breast MLD for edema reduction Baseline: Goal status: INITIAL  2.  Pt will decrease breast edema questionnaire to 15/80 or less Baseline: 39 Goal status: INITIAL  PLAN:  PT FREQUENCY: 1x/week  PT DURATION: 4 weeks after eval  PLANNED INTERVENTIONS: 97164- PT Re-evaluation, 97110-Therapeutic exercises, 97535- Self Care, 16109- Manual therapy, Patient/Family education, Therapeutic exercises, Therapeutic activity, Neuromuscular re-education, and Self Care  PLAN FOR NEXT SESSION: Cont and review Lt breast MLD; wearing compression bra more?   Hermenia Bers, PTA 12/27/2023, 10:12 AM

## 2023-12-30 ENCOUNTER — Ambulatory Visit
Admission: RE | Admit: 2023-12-30 | Discharge: 2023-12-30 | Disposition: A | Discharge status: Home / Self Care | Payer: Self-pay | Source: Ambulatory Visit | Attending: Adult Health | Admitting: Adult Health

## 2023-12-30 DIAGNOSIS — Z170 Estrogen receptor positive status [ER+]: Secondary | ICD-10-CM

## 2023-12-30 HISTORY — DX: Personal history of irradiation: Z92.3

## 2024-01-03 ENCOUNTER — Ambulatory Visit: Payer: 59 | Admitting: Rehabilitation

## 2024-01-03 ENCOUNTER — Encounter: Payer: Self-pay | Admitting: Rehabilitation

## 2024-01-03 DIAGNOSIS — C50412 Malignant neoplasm of upper-outer quadrant of left female breast: Secondary | ICD-10-CM

## 2024-01-03 DIAGNOSIS — I890 Lymphedema, not elsewhere classified: Secondary | ICD-10-CM

## 2024-01-03 DIAGNOSIS — Z483 Aftercare following surgery for neoplasm: Secondary | ICD-10-CM | POA: Diagnosis not present

## 2024-01-03 NOTE — Therapy (Signed)
 OUTPATIENT PHYSICAL THERAPY  UPPER EXTREMITY ONCOLOGY TREATMENT  Patient Name: Sue Beasley MRN: [id number] DOB:04-13-1971, 53 y.o., female Today's Date: 01/03/2024  END OF SESSION:  PT End of Session - 01/03/24 1051     Visit Number 5    Number of Visits 5    Date for PT Re-Evaluation 01/03/24    PT Start Time 0900    PT Stop Time 0951    PT Time Calculation (min) 51 min    Activity Tolerance Patient tolerated treatment well    Behavior During Therapy [facility] for tasks assessed/performed               Past Medical History:  Diagnosis Date   ALLERGIC RHINITIS 11/06/2009   Allergy    Anemia    ANEMIA-IRON DEFICIENCY 11/20/2009   Asthma    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 Antony Blackbird   Irreducible umbilical hernia 03/01/2011   Personal history of radiation therapy    Wears glasses    Past Surgical History:  Procedure Laterality Date   BREAST BIOPSY Left 01/14/2023   [place] LT BREAST BX W LOC DEV 1ST LESION IMG BX SPEC US GUIDE 01/14/2023 [facility]   BREAST BIOPSY  02/10/2023   MM LT RADIOACTIVE SEED LOC MAMMO GUIDE 02/10/2023 [facility]   BREAST LUMPECTOMY WITH RADIOACTIVE SEED AND SENTINEL LYMPH NODE BIOPSY Left 02/14/2023   Procedure: LEFT BREAST LUMPECTOMY WITH RADIOACTIVE SEED AND AXILLARY SENTINEL LYMPH NODE BIOPSY;  Surgeon: Emelia Loron, MD;  Location: [facility];  Service: General;  Laterality: Left;   PORT-A-CATH REMOVAL N/A 05/31/2023   Procedure: REMOVAL PORT-A-CATH;  Surgeon: Emelia Loron, MD;  Location: [facility];  Service: General;  Laterality: N/A;   PORTACATH PLACEMENT Right 03/15/2023   Procedure: INSERTION PORT-A-CATH;  Surgeon: Emelia Loron, MD;  Location: [facility] OR;  Service: General;  Laterality: Right;   UMBILICAL HERNIA REPAIR  05/13/11   Patient Active Problem List   Diagnosis Date Noted    B12 deficiency 11/29/2023   Port-A-Cath in place 03/23/2023   Genetic testing 02/04/2023   Malignant neoplasm of upper-outer quadrant of left breast in female, estrogen receptor positive (HCC) 01/24/2023   Low vitamin D level 11/19/2021   Incontinence 12/19/2020   Low mean corpuscular volume (MCV) 11/23/2020   Pain and swelling of wrist, left 03/14/2020   Hyperglycemia 11/09/2019   Uterine leiomyoma 11/09/2019   Acute upper respiratory infection 02/12/2019   Increased body mass index 11/02/2018   Obesity 08/02/2014   Heart murmur, systolic 08/01/2014   Encounter for well adult exam with abnormal findings 03/01/2011   Hyperlipidemia 11/20/2009   Iron deficiency anemia 11/20/2009   Allergic rhinitis 11/06/2009   Asthma 11/06/2009    PCP: Oliver Barre, MD  REFERRING PROVIDER: Dr. Pamelia Hoit   REFERRING DIAG: Left breast swelling   THERAPY DIAG:  Aftercare following surgery for neoplasm  Malignant neoplasm of upper-outer quadrant of left breast in female, estrogen receptor positive (HCC)  Lymphedema, not elsewhere classified  ONSET DATE: 01/24/23  Rationale for Evaluation and Treatment: Rehabilitation  SUBJECTIVE:  SUBJECTIVE STATEMENT:  I can tell I am doing better. 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 It measures 1.1 cm and is located in the upper outer quadrant. It is ER/PR positive HER2 negative with a Ki67 of 70%. left lumpectomy and SLNB on 02/14/23 with completion of chemotherapy with TC and radiation. 8 negative nodes removed. 1 aspiration in the axilla of 75ml   PAIN:  Are you having pain? No  PRECAUTIONS: None  RED FLAGS: None   WEIGHT BEARING RESTRICTIONS: No  FALLS:  Has patient fallen in last 6 months?  No  LIVING ENVIRONMENT: Lives with: lives alone  OCCUPATION: math professor   LEISURE: running club - starting to train for a 5K   HAND DOMINANCE: right   PRIOR LEVEL OF FUNCTION: Independent  PATIENT GOALS: What do I do for the swelling    OBJECTIVE: Note: Objective measures were completed at Evaluation unless otherwise noted.  COGNITION: Overall cognitive status: Within functional limits for tasks assessed   PALPATION: Fibrosis medial and inferior breast, scar tissue lateral breast, congestion axilla   OBSERVATIONS / OTHER ASSESSMENTS: wearing compression bra as of yesterday, peau de orange skin medial breast, birth mark on anterior upper breast, signs of desquamation under the breast from radiation now healed   UPPER EXTREMITY AROM/PROM: ALL WNL but some tightness lateral breast with all   UPPER EXTREMITY STRENGTH: WNL  L-DEX LYMPHEDEMA SCREENING:  BREAST COMPLAINTS QUESTIONNAIRE    12/06/23  01/03/24 Pain:   1  0 Heaviness:  6  1 Swollen feeling: 6  1  Tense Skin:  1  1 Redness:  1  0 Bra Print:  9  1 Size of Pores:  6  1  Hard feeling:   9  1 Total:      39 /80  6/80 A Score over 9 indicates lymphedema issues in the breast                                                                                                                             TREATMENT DATE:  01/03/24: Manual Therapy MLD in supine to Lt breast: Short neck, superficial and deep abdominals, Lt inguinal nodes, Lt axillo-inguinal anastomosis, Rt axillary and pectoral nodes, Rt intact anterior thorax sequence, anterior inter-axillary anastomosis, then focused on medial Lt breast, then into Rt S/L for focus to lateral breast redirecting towards lateral anastomosis, then finished retracing all steps in supine.   12/27/23: Manual Therapy MLD in supine to Lt breast: Short neck, superficial and deep abdominals, Lt inguinal nodes, Lt axillo-inguinal anastomosis, Rt axillary and pectoral nodes, Rt intact  anterior thorax sequence, anterior inter-axillary anastomosis, then focused on medial Lt breast, then into Rt S/L for focus to lateral breast redirecting towards lateral anastomosis, then finished retracing all steps in supine.   12/20/23 Manual Therapy MLD in supine to Lt breast: Short neck, 5 diaphragmatic breaths, Lt  inguinal nodes, Lt axillo-inguinal anastomosis, Rt axillary and pectoral nodes, Rt intact anterior thorax sequence, anterior inter-axillary anastomosis, then focused on medial Lt breast, then into Rt S/L for focus to lateral breast redirecting towards lateral anastomosis, then finished retracing all steps in supine.  // 12/13/23: Self Care Educated pt about basics of anatomy and principles of MLD, also used visual diagram. Encouraged pt to wear her compression bra as much as tolerated as well to further help reduce lymphedema.  Manual Therapy MLD in supine to Lt breast: Short neck, 5 diaphragmatic breaths, Lt inguinal nodes, Lt axillo-inguinal anastomosis, Rt axillary and pectoral nodes, Rt intact anterior thorax sequence, anterior inter-axillary anastomosis, then focused on medial Lt breast, then into Rt S/L for focus to lateral breast redirecting towards lateral anastomosis, then finished retracing all steps in supine. Had pt start to return demo for each step. Hand over hand cuing for correct pressure and skin stretch.    PATIENT EDUCATION:  Education details: per today's note Person educated: Patient Education method: Explanation, Demonstration, Tactile cues, Verbal cues, and Handouts Education comprehension: verbalized understanding, returned demonstration, verbal cues required, tactile cues required, and needs further education  HOME EXERCISE PROGRAM: Start self MLD - added to instruction section 12/06/23  ASSESSMENT:  CLINICAL IMPRESSION: Continued with Lt breast MLD today.  All goals are met with a significant decrease in her breast edema questionnaire score. Will D/C  with continued SOZO.   OBJECTIVE IMPAIRMENTS: decreased knowledge of condition, decreased knowledge of use of DME, increased edema, and increased fascial restrictions.   ACTIVITY LIMITATIONS: none  PARTICIPATION LIMITATIONS: none  PERSONAL FACTORS: 1-2 comorbidities: SLNB, radiation hx  are also affecting patient's functional outcome.   REHAB POTENTIAL: Excellent  CLINICAL DECISION MAKING: Stable/uncomplicated  EVALUATION COMPLEXITY: Low  GOALS: Goals reviewed with patient? Yes  SHORT TERM GOALS=LTGs: Target date: 01/03/24  Pt will be ind with breast MLD for edema reduction Baseline: Goal status: INITIAL  2.  Pt will decrease breast edema questionnaire to 15/80 or less Baseline: 39 Goal status: MET  PLAN:  PT FREQUENCY: 1x/week  PT DURATION: 4 weeks after eval  PLANNED INTERVENTIONS: 97164- PT Re-evaluation, 97110-Therapeutic exercises, 97535- Self Care, 16109- Manual therapy, Patient/Family education, Therapeutic exercises, Therapeutic activity, Neuromuscular re-education, and Self Care  PLAN FOR NEXT SESSION: Cont and review Lt breast MLD; wearing compression bra more?   Idamae Lusher, PT 01/03/2024, 10:51 AM  PHYSICAL THERAPY DISCHARGE SUMMARY  Visits from Start of Care: 5  Current functional level related to goals / functional outcomes: All goals met   Remaining deficits: Lymphedema risk    Education / Equipment: Final plan.  Plan: Patient agrees to discharge. Patient is being discharged due to meeting the stated rehab goals.

## 2024-01-23 ENCOUNTER — Telehealth: Payer: Self-pay

## 2024-01-23 NOTE — Telephone Encounter (Signed)
 Pt called and asked if it is OK that she is somewhat moody around the time she would expect a menstrual cycle every month while taking Tamoxifen. She denies being easily irritable or depressed/anxious, just sometimes moody. Advised pt this is common and if it were to become a problem we would want her to let us know. She verbalized understanding and agreement.

## 2024-03-14 ENCOUNTER — Ambulatory Visit: Attending: Hematology and Oncology

## 2024-03-14 VITALS — Wt 214.2 lb

## 2024-03-14 DIAGNOSIS — Z483 Aftercare following surgery for neoplasm: Secondary | ICD-10-CM | POA: Insufficient documentation

## 2024-03-14 NOTE — Therapy (Signed)
 OUTPATIENT PHYSICAL THERAPY SOZO SCREENING NOTE   Patient Name: Sue Beasley MRN: [id number] DOB:05-10-1971, 53 y.o., female Today's Date: 03/14/2024  PCP: Roslyn Coombe, MD REFERRING PROVIDER: Cameron Cea, MD   PT End of Session - 03/14/24 1719     Visit Number 5   # unchanged due to screen only   PT Start Time 1202    PT Stop Time 1206    PT Time Calculation (min) 4 min    Activity Tolerance Patient tolerated treatment well    Behavior During Therapy [facility] for tasks assessed/performed             Past Medical History:  Diagnosis Date   ALLERGIC RHINITIS 11/06/2009   Allergy    Anemia    ANEMIA-IRON DEFICIENCY 11/20/2009   Asthma    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 Retta Caster   Irreducible umbilical hernia 03/01/2011   Personal history of radiation therapy    Wears glasses    Past Surgical History:  Procedure Laterality Date   BREAST BIOPSY Left 01/14/2023   US  LT BREAST BX W LOC DEV 1ST LESION IMG BX SPEC US  GUIDE 01/14/2023 [facility]   BREAST BIOPSY  02/10/2023   MM LT RADIOACTIVE SEED LOC MAMMO GUIDE 02/10/2023 [facility]   BREAST LUMPECTOMY WITH RADIOACTIVE SEED AND SENTINEL LYMPH NODE BIOPSY Left 02/14/2023   Procedure: LEFT BREAST LUMPECTOMY WITH RADIOACTIVE SEED AND AXILLARY SENTINEL LYMPH NODE BIOPSY;  Surgeon: Enid Harry, MD;  Location: [facility];  Service: General;  Laterality: Left;   PORT-A-CATH REMOVAL N/A 05/31/2023   Procedure: REMOVAL PORT-A-CATH;  Surgeon: Enid Harry, MD;  Location:  [facility];  Service: General;  Laterality: N/A;   PORTACATH PLACEMENT Right 03/15/2023   Procedure: INSERTION PORT-A-CATH;  Surgeon: Enid Harry, MD;  Location: [facility] OR;  Service: General;  Laterality: Right;   UMBILICAL HERNIA REPAIR  05/13/11   Patient Active Problem List   Diagnosis Date Noted    B12 deficiency 11/29/2023   Port-A-Cath in place 03/23/2023   Genetic testing 02/04/2023   Malignant neoplasm of upper-outer quadrant of left breast in female, estrogen receptor positive (HCC) 01/24/2023   Low vitamin D  level 11/19/2021   Incontinence 12/19/2020   Low mean corpuscular volume (MCV) 11/23/2020   Pain and swelling of wrist, left 03/14/2020   Hyperglycemia 11/09/2019   Uterine leiomyoma 11/09/2019   Acute upper respiratory infection 02/12/2019   Increased body mass index 11/02/2018   Obesity 08/02/2014   Heart murmur, systolic 08/01/2014   Encounter for well adult exam with abnormal findings 03/01/2011   Hyperlipidemia 11/20/2009   Iron deficiency anemia 11/20/2009   Allergic rhinitis 11/06/2009   Asthma 11/06/2009    REFERRING DIAG: left breast cancer at risk for lymphedema  THERAPY DIAG: Aftercare following surgery for neoplasm  PERTINENT HISTORY: 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 It measures 1.1 cm and is located in the upper outer quadrant. It is ER/PR positive HER2 negative with a Ki67 of 70%. left lumpectomy and SLNB on 02/14/23 with completion of chemotherapy with TC and radiation. 8 negative nodes removed. 1 aspiration in the axilla of 75ml   PRECAUTIONS: left UE Lymphedema risk, None  SUBJECTIVE: Pt returns for her 3 month L-Dex screen.   PAIN:  Are you having pain? No  SOZO SCREENING: Patient was assessed  today using the SOZO machine to determine the lymphedema index score. This was compared to her baseline score. It was determined that she is within the recommended range when compared to her baseline and no further action is needed at this time. She will continue SOZO screenings. These are done every 3 months for 2 years post operatively followed by every 6 months for 2 years, and then annually.   L-DEX FLOWSHEETS - 03/14/24 1700       L-DEX LYMPHEDEMA SCREENING   Measurement Type Unilateral    L-DEX MEASUREMENT EXTREMITY Upper  Extremity    POSITION  Standing    DOMINANT SIDE Left    At Risk Side Left    BASELINE SCORE (UNILATERAL) -1.6    L-DEX SCORE (UNILATERAL) -3.1    VALUE CHANGE (UNILAT) -1.5               Denyce Flank, PTA 03/14/2024, 5:20 PM

## 2024-03-20 ENCOUNTER — Ambulatory Visit: Payer: 59

## 2024-05-01 ENCOUNTER — Telehealth: Payer: Self-pay | Admitting: *Deleted

## 2024-05-01 NOTE — Telephone Encounter (Signed)
 Received call from pt requesting advice from MD if okay to switch from Claritin to Allegra for her allergies.  Pt states she is worried about developing joint pain on Tamoxifen .  RN educated pt that Tamoxifen  typically does not cause joint pain and to proceed with Allegra.  Pt is already scheduled to f/u with MD in 2 weeks and will discuss with MD if joint pain does develop in that timeframe.  Pt verbalized understanding.

## 2024-05-14 NOTE — Assessment & Plan Note (Signed)
 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 Oncotype DX recurrence score: 36 (distant recurrence at 9 years: 24%)   Recommendation: Adjuvant chemotherapy with Taxotere  and Cytoxan  every 3 weeks x 4 completed 05/18/2023 Adjuvant radiation therapy completed 07/22/2023 Adjuvant antiestrogen therapy started 08/09/2023 with tamoxifen  (patient had menstrual cycles until chemo) -------------------------------------------------------------------------------------------------------- Tamoxifen  Toxicities:  Breast Cancer Surveillance: Breast Exam 05/15/24 Mammogram: 12/30/23: Benign Density cat B  RTC in 1 year

## 2024-05-15 ENCOUNTER — Inpatient Hospital Stay: Payer: Self-pay | Attending: Hematology and Oncology | Admitting: Hematology and Oncology

## 2024-05-15 ENCOUNTER — Inpatient Hospital Stay

## 2024-05-15 VITALS — BP 133/52 | HR 72 | Temp 98.6°F | Resp 16 | Ht 61.0 in | Wt 213.4 lb

## 2024-05-15 DIAGNOSIS — N951 Menopausal and female climacteric states: Secondary | ICD-10-CM | POA: Insufficient documentation

## 2024-05-15 DIAGNOSIS — Z170 Estrogen receptor positive status [ER+]: Secondary | ICD-10-CM

## 2024-05-15 DIAGNOSIS — Z79810 Long term (current) use of selective estrogen receptor modulators (SERMs): Secondary | ICD-10-CM | POA: Diagnosis not present

## 2024-05-15 DIAGNOSIS — C50412 Malignant neoplasm of upper-outer quadrant of left female breast: Secondary | ICD-10-CM | POA: Diagnosis present

## 2024-05-15 NOTE — Progress Notes (Signed)
 Patient Care Team: Norleen Lynwood ORN, MD as PCP - General Odean Potts, MD as Consulting Physician (Hematology and Oncology) Shannon Lynwood, MD as Consulting Physician (Radiation Oncology) Ebbie Cough, MD as Consulting Physician (General Surgery)  DIAGNOSIS:  Encounter Diagnosis  Name Primary?   Malignant neoplasm of upper-outer quadrant of left breast in female, estrogen receptor positive (HCC) Yes    SUMMARY OF ONCOLOGIC HISTORY: Oncology History  Malignant neoplasm of upper-outer quadrant of left breast in female, estrogen receptor positive (HCC)  01/14/2023 Initial Diagnosis   Screening mammogram detected left breast mass UOQ 12:30 position: 1.1 cm, ultrasound biopsy revealed poorly differentiated IDC grade 3, ER 100%, PR 3%, Ki-67 70%, HER2 negative, axillary lymph node biopsy: Benign concordant   01/26/2023 Cancer Staging   Staging form: Breast, AJCC 8th Edition - Clinical: Stage IA (cT1c, cN0, cM0, G3, ER+, PR+, HER2-) - Signed by Odean Potts, MD on 01/26/2023 Histologic grading system: 3 grade system   02/03/2023 Genetic Testing   Negative [facility] CustomNext-Cancer +RNAinsight Panel.  Report date is 02/10/2023.   The CustomNext-Cancer+RNAinsight panel offered by [facility] includes sequencing, rearrangement, and RNA analysis for the following 47 genes:  APC, ATM, AXIN2, BAP1, BARD1, BMPR1A, BRCA1, BRCA2, BRIP1, CDH1, CDK4, CDKN2A, CHEK2, CTNNA1, DICER1, EPCAM, FH, GREM1, HOXB13, KIT, MBD4, MEN1, MLH1, MSH2, MSH3, MSH6, MUTYH, NF1, NTHL1, PALB2, PDGFRA, PMS2, POLD1, POLE, PTEN, RAD51C, RAD51D, SDHA, SDHB, SDHC, SDHD, SMAD4, SMARCA4, STK11, TP53, TSC1, TSC2, VHL.      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    03/01/2023 Cancer Staging   Staging form: Breast, AJCC 8th Edition - Pathologic: Stage IA (pT1c, pN0, cM0, G3, ER+, PR+, HER2-) - Signed by Odean Potts, MD  on 03/01/2023 Histologic grading system: 3 grade system   03/03/2023 Oncotype testing   Oncotype DX recurrence score 36: Distant recurrence at 9 years: 24%   03/16/2023 - 05/20/2023 Chemotherapy   Patient is on Treatment Plan : BREAST TC q21d     06/23/2023 - 07/22/2023 Radiation Therapy   Left breast - 40.05 Gy delivered in 15 Fx at 2.67 Gy/Fx Left breast boost - 10 Gy delivered in 5 Fx at 2 Gy/Fx   07/2023 -  Anti-estrogen oral therapy   20 mg Tamoxifen      CHIEF COMPLIANT: Follow-up on tamoxifen  therapy  HISTORY OF PRESENT ILLNESS:   History of Present Illness Sue Beasley is a 53 year old female who presents for evaluation of menopausal status and hot flashes.  She experiences mild hot flashes characterized by a quick sensation of heat that dissipates rapidly. These episodes are sometimes triggered by consuming sweets, chocolate, wine, or spicy foods. Drinking water helps manage these episodes.  She has not had a menstrual cycle since completing chemotherapy on July 24th of the previous year and is uncertain if she is in menopause.     ALLERGIES:  is allergic to peanut-containing drug products, soy allergy (obsolete), egg-derived products, oxycodone , amoxicillin, amoxicillin-pot clavulanate, and shellfish-derived products.  MEDICATIONS:  Current Outpatient Medications  Medication Sig Dispense Refill   albuterol (VENTOLIN HFA) 108 (90 Base) MCG/ACT inhaler Inhale 2 puffs into the lungs every 6 (six) hours as needed for shortness of breath or wheezing.     azelastine (ASTELIN) 0.1 % nasal spray Place 1 spray into both nostrils in the morning.  5   desoximetasone (TOPICORT) 0.25 % cream Apply 1 Application  topically 2 (two) times daily as needed (eczema).     diphenhydrAMINE (BENADRYL) 25 MG tablet Take 25 mg by mouth daily as needed (food allergies (with restaurant eating)).     dorzolamide-timolol (COSOPT) 22.3-6.8 MG/ML ophthalmic solution Place 1 drop into both eyes 2 (two)  times daily.     EPINEPHrine  0.3 mg/0.3 mL IJ SOAJ injection Inject 0.3 mg into the muscle as needed for anaphylaxis.     ferrous sulfate 325 (65 FE) MG tablet Take 325 mg by mouth 3 (three) times a week.     fluticasone (FLONASE) 50 MCG/ACT nasal spray Place 2 sprays into both nostrils in the morning.  4   fluticasone-salmeterol (WIXELA INHUB) 250-50 MCG/ACT AEPB Inhale 1 puff into the lungs in the morning.     ketotifen (ZADITOR) 0.035 % ophthalmic solution Place 1-2 drops into both eyes 2 (two) times daily as needed (allergies.).     latanoprost (XALATAN) 0.005 % ophthalmic solution Place 1 drop into both eyes at bedtime.     tamoxifen  (NOLVADEX ) 20 MG tablet Take 1 tablet (20 mg total) by mouth daily. 90 tablet 3   No current facility-administered medications for this visit.    PHYSICAL EXAMINATION: ECOG PERFORMANCE STATUS: 1 - Symptomatic but completely ambulatory  Vitals:   05/15/24 0901  BP: (!) 133/52  Pulse: 72  Resp: 16  Temp: 98.6 F (37 C)  SpO2: 100%   Filed Weights   05/15/24 0901  Weight: 213 lb 6.4 oz (96.8 kg)    Physical Exam No palpable lumps or nodules in bilateral breasts or axilla.  Left breast darkened discoloration from prior radiation  (exam performed in the presence of a chaperone)  LABORATORY DATA:  I have reviewed the data as listed    Latest Ref Rng & Units 11/25/2023   12:59 PM 05/18/2023    7:36 AM 04/27/2023    7:56 AM  CMP  Glucose 70 - 99 mg/dL 87  838  877   BUN 6 - 23 mg/dL 13  9  10    Creatinine 0.40 - 1.20 mg/dL 9.32  9.36  9.28   Sodium 135 - 145 mEq/L 138  139  139   Potassium 3.5 - 5.1 mEq/L 3.9  3.5  3.4   Chloride 96 - 112 mEq/L 104  106  106   CO2 19 - 32 mEq/L 25  24  26    Calcium 8.4 - 10.5 mg/dL 8.7  9.0  8.8   Total Protein 6.0 - 8.3 g/dL 6.8  6.7  6.3   Total Bilirubin 0.2 - 1.2 mg/dL 0.3  0.3  0.2   Alkaline Phos 39 - 117 U/L 88  103  86   AST 0 - 37 U/L 21  19  22    ALT 0 - 35 U/L 14  8  36     Lab Results   Component Value Date   WBC 4.4 11/25/2023   HGB 11.9 (L) 11/25/2023   HCT 37.4 11/25/2023   MCV 70.1 (L) 11/25/2023   PLT 215.0 11/25/2023   NEUTROABS 2.2 11/25/2023    ASSESSMENT & PLAN:  Malignant neoplasm of upper-outer quadrant of left breast in female, estrogen receptor positive (HCC) 02/14/2023: Left lumpectomy: 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 Oncotype DX recurrence score: 36 (distant recurrence at 9 years: 24%)   Recommendation: Adjuvant chemotherapy with Taxotere  and Cytoxan  every 3 weeks x 4 completed 05/18/2023  Adjuvant radiation therapy completed 07/22/2023 Adjuvant antiestrogen therapy started 08/09/2023 with tamoxifen  (patient had menstrual cycles until chemo) -------------------------------------------------------------------------------------------------------- Tamoxifen  Toxicities: Mild hot flashes: Tolerating it otherwise well  Breast Cancer Surveillance: Breast Exam 05/15/24: Benign Mammogram: 12/30/23: Benign Density cat B  We would like to obtain FSH and estradiol  levels today to see if she is in menopause.  She has not had a menstrual cycle in over a year. RTC in 1 year    Assessment & Plan Uncertain menopausal status Menopausal status unclear post-chemotherapy. Determination essential for treatment optimization. - Order labs to assess menopausal status. - Switch to superior medications if postmenopausal status is confirmed.  Hot flashes Mild hot flashes triggered by specific foods, transient and self-resolving.  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 - Provide prescription for specialized bras.  Intermittent pressure sensation in head Intermittent head pressure likely related to glasses, possibly nerve pressure.      No orders of the defined types were placed in this encounter.  The patient has a good understanding of the overall  plan. she agrees with it. she will call with any problems that may develop before the next visit here. Total time spent: 30 mins including face to face time and time spent for planning, charting and co-ordination of care   Naomi MARLA Chad, MD 05/15/24

## 2024-05-16 LAB — FOLLICLE STIMULATING HORMONE: FSH: 16.2 m[IU]/mL

## 2024-05-18 LAB — ESTRADIOL, ULTRA SENS: Estradiol, Sensitive: 6.8 pg/mL

## 2024-05-29 ENCOUNTER — Inpatient Hospital Stay: Admitting: Hematology and Oncology

## 2024-06-06 ENCOUNTER — Inpatient Hospital Stay: Attending: Adult Health | Admitting: Adult Health

## 2024-06-06 ENCOUNTER — Encounter: Payer: Self-pay | Admitting: Adult Health

## 2024-06-06 DIAGNOSIS — Z170 Estrogen receptor positive status [ER+]: Secondary | ICD-10-CM

## 2024-06-06 DIAGNOSIS — C50412 Malignant neoplasm of upper-outer quadrant of left female breast: Secondary | ICD-10-CM

## 2024-06-06 MED ORDER — TAMOXIFEN CITRATE 20 MG PO TABS: 20.0000 mg | ORAL_TABLET | Freq: Every day | ORAL | 3 refills | Status: AC

## 2024-06-06 NOTE — Assessment & Plan Note (Addendum)
 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 Oncotype DX recurrence score: 36 (distant recurrence at 9 years: 24%)   Recommendation: Adjuvant chemotherapy with Taxotere  and Cytoxan  every 3 weeks x 4 completed 05/18/2023 Adjuvant radiation therapy completed 07/22/2023 Adjuvant antiestrogen therapy started 08/09/2023 with tamoxifen  (patient had menstrual cycles until chemo) -------------------------------------------------------------------------------------------------------- Tamoxifen  Toxicities:  Assessment and Plan Assessment & Plan Invasive ductal carcinoma of left breast, status post lumpectomy, adjuvant chemotherapy, and radiation, on antiestrogen therapy Invasive ductal carcinoma treated with lumpectomy, chemotherapy, and radiation. On tamoxifen  since October 2024. She has been without menstrual cycle or any vaginal bleeding since May 2024, sensitive estradiol  6.8.  Consider Anastrozole, risks and benefits discussed.  - After discussion, patient with remain on Tamoxifen  due to her excellent tolerance. - Consider rechecking FSH and estradiol  next year and continued discussion of switch to Anastrozole - Patient opted to pursue Guardant Reveal testing every 6 months  - Patient was recommended to continue annual mammograms, healthy diet, and exercise.

## 2024-06-06 NOTE — Progress Notes (Signed)
 [facility] Cancer Follow up:    Norleen Lynwood ORN, MD [address] [place] [address]   DIAGNOSIS:  Cancer Staging  Malignant neoplasm of upper-outer quadrant of left breast in female, estrogen receptor positive (HCC) Staging form: Breast, AJCC 8th Edition - Clinical: Stage IA (cT1c, cN0, cM0, G3, ER+, PR+, HER2-) - Signed by Odean Potts, MD on 01/26/2023 Histologic grading system: 3 grade system - Pathologic: Stage IA (pT1c, pN0, cM0, G3, ER+, PR+, HER2-) - Signed by Odean Potts, MD on 03/01/2023 Histologic grading system: 3 grade system   I connected with Leretha Karna Hope on 06/06/24 at  8:40 AM EDT by telephone and verified that I am speaking with the correct person using two identifiers.  I discussed the limitations, risks, security and privacy concerns of performing an evaluation and management service by telephone and the availability of in person appointments.  I also discussed with the patient that there may be a patient responsible charge related to this service. The patient expressed understanding and agreed to proceed.  Patient location: Home Provider location: [facility] office   SUMMARY OF ONCOLOGIC HISTORY: Oncology History  Malignant neoplasm of upper-outer quadrant of left breast in female, estrogen receptor positive (HCC)  01/14/2023 Initial Diagnosis   Screening mammogram detected left breast mass UOQ 12:30 position: 1.1 cm, ultrasound biopsy revealed poorly differentiated IDC grade 3, ER 100%, PR 3%, Ki-67 70%, HER2 negative, axillary lymph node biopsy: Benign concordant   01/26/2023 Cancer Staging   Staging form: Breast, AJCC 8th Edition - Clinical: Stage IA (cT1c, cN0, cM0, G3, ER+, PR+, HER2-) - Signed by Odean Potts, MD on 01/26/2023 Histologic grading system: 3 grade system   02/03/2023 Genetic Testing   Negative [facility] CustomNext-Cancer +RNAinsight Panel.  Report date is 02/10/2023.   The CustomNext-Cancer+RNAinsight panel offered by [facility] includes sequencing, rearrangement, and RNA analysis for the following 47 genes:  APC, ATM, AXIN2, BAP1, BARD1, BMPR1A, BRCA1, BRCA2, BRIP1, CDH1, CDK4, CDKN2A, CHEK2, CTNNA1, DICER1, EPCAM, FH, GREM1, HOXB13, KIT, MBD4, MEN1, MLH1, MSH2, MSH3, MSH6, MUTYH, NF1, NTHL1, PALB2, PDGFRA, PMS2, POLD1, POLE, PTEN, RAD51C, RAD51D, SDHA, SDHB, SDHC, SDHD, SMAD4, SMARCA4, STK11, TP53, TSC1, TSC2, VHL.      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    03/01/2023 Cancer Staging   Staging form: Breast, AJCC 8th Edition - Pathologic: Stage IA (pT1c, pN0, cM0, G3, ER+, PR+, HER2-) - Signed by Odean Potts, MD on 03/01/2023 Histologic grading system: 3 grade system   03/03/2023 Oncotype testing   Oncotype DX recurrence score 36: Distant recurrence at 9 years: 24%   03/16/2023 - 05/20/2023 Chemotherapy   Patient is on Treatment Plan : BREAST TC q21d     06/23/2023 - 07/22/2023 Radiation Therapy   Left breast - 40.05 Gy delivered in 15 Fx at 2.67 Gy/Fx Left breast boost - 10 Gy delivered in 5 Fx at 2 Gy/Fx   07/2023 -  Anti-estrogen oral therapy   20 mg Tamoxifen      CURRENT THERAPY: Tamoxifen   INTERVAL HISTORY:  Discussed the use of AI scribe software for clinical note transcription with the patient, who gave verbal consent to proceed.  History of Present Illness Sue Beasley is a 53 year old female with a history of left breast invasive ductal carcinoma who presents for follow-up regarding her postmenopausal status and antiestrogen therapy.  She was diagnosed  with estrogen receptor positive left breast invasive ductal carcinoma in March 2024 and underwent lumpectomy, adjuvant chemotherapy, and radiation. She began tamoxifen  therapy in October 2024.  Hormone levels from May 15, 2024, show a sensitive estradiol  level of 6.8 and FSH of 16.2, with no menstrual cycle since May 2024.  She experiences hot flashes on tamoxifen . There is no vaginal discharge, bleeding, or spotting since her last menstrual cycle. She is hesitant to consider switching to a different antiestrogen therapy at this point since she is tolerating tamoxifen  well.   She maintains regular exercise and a healthy diet and participates in a cancer support group.  Patient Active Problem List   Diagnosis Date Noted   B12 deficiency 11/29/2023   Port-A-Cath in place 03/23/2023   Genetic testing 02/04/2023   Malignant neoplasm of upper-outer quadrant of left breast in female, estrogen receptor positive (HCC) 01/24/2023   Low vitamin D  level 11/19/2021   Incontinence 12/19/2020   Low mean corpuscular volume (MCV) 11/23/2020   Pain and swelling of wrist, left 03/14/2020   Hyperglycemia 11/09/2019   Uterine leiomyoma 11/09/2019   Acute upper respiratory infection 02/12/2019   Increased body mass index 11/02/2018   Obesity 08/02/2014   Heart murmur, systolic 08/01/2014   Encounter for well adult exam with abnormal findings 03/01/2011   Hyperlipidemia 11/20/2009   Iron deficiency anemia 11/20/2009   Allergic rhinitis 11/06/2009   Asthma 11/06/2009    is allergic to peanut-containing drug products, soy allergy (obsolete), egg-derived products, oxycodone , amoxicillin, amoxicillin-pot clavulanate, and shellfish-derived products.  MEDICAL HISTORY: Past Medical History:  Diagnosis Date   ALLERGIC RHINITIS 11/06/2009   Allergy    Anemia    ANEMIA-IRON DEFICIENCY 11/20/2009   Asthma    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 Lynwood Nasuti   Irreducible umbilical hernia 03/01/2011   Personal history of radiation therapy    Wears glasses     SURGICAL HISTORY: Past Surgical History:  Procedure Laterality Date   BREAST BIOPSY Left 01/14/2023   US  LT BREAST BX W LOC DEV 1ST LESION IMG BX SPEC US  GUIDE  01/14/2023 [facility]MO[facility]   BREAST BIOPSY  02/10/2023   MM LT RADIOACTIVE SEED LOC MAMMO GUIDE 02/10/2023 [facility] MAMMOGR[facility]   BREAST LUMPECTOMY WITH RADIOACTIVE SEED AND SENTINEL LYMPH NODE BIOPSY Left 02/14/2023   Procedure: LEFT BREAST LUMPECTOMY WITH RADIOACTIVE SEED AND AXILLARY SENTINEL LYMPH NODE BIOPSY;  Surgeon: Ebbie Cough, MD;  Location: [facility];  Service: General;  Laterality: Left;   PORT-A-CATH REMOVAL N/A 05/31/2023   Procedure: REMOVAL PORT-A-CATH;  Surgeon: Ebbie Cough, MD;  Location: [facility];  Service: General;  Laterality: N/A;   PORTACATH PLACEMENT Right 03/15/2023   Procedure: INSERTION PORT-A-CATH;  Surgeon: Ebbie Cough, MD;  Location: [facility] OR;  Service: General;  Laterality: Right;   UMBILICAL HERNIA REPAIR  05/13/11    SOCIAL HISTORY: Social History   Socioeconomic History   Marital status: Single    Spouse name: Not on file   Number of children: 0   Years of education: Not on file   Highest education level: Not on file  Occupational History   Occupation: math teacher    Comment: calculus  Tobacco Use   Smoking status: Never   Smokeless tobacco: Never  Vaping Use   Vaping status: Never Used  Substance and Sexual Activity   Alcohol use:  No   Drug use: No   Sexual activity: Not Currently    Birth control/protection: None  Other Topics Concern   Not on file  Social History Narrative   Not on file   Social Drivers of Health   Financial Resource Strain: Not on file  Food Insecurity: No Food Insecurity (06/06/2023)   Hunger Vital Sign    Worried About Running Out of Food in the Last Year: Never true    Ran Out of Food in the Last Year: Never true  Transportation Needs: No Transportation Needs (06/06/2023)   PRAPARE - Administrator, Civil Service (Medical): No    Lack of Transportation (Non-Medical): No  Physical Activity: Not on file  Stress: Not on file  Social Connections: Not on file   Intimate Partner Violence: Not At Risk (06/06/2023)   Humiliation, Afraid, Rape, and Kick questionnaire    Fear of Current or Ex-Partner: No    Emotionally Abused: No    Physically Abused: No    Sexually Abused: No    FAMILY HISTORY: Family History  Problem Relation Age of Onset   Hyperlipidemia Mother    Diabetes Father    Hypertension Father    Colon polyps Sister    Esophageal cancer Maternal Uncle 7   Pancreatic cancer Cousin 64       mat female cousin   Prostate cancer Cousin 55       mat cousin   Breast cancer Neg Hx    Colon cancer Neg Hx    Stomach cancer Neg Hx    Rectal cancer Neg Hx     Review of Systems  Constitutional:  Negative for appetite change, chills, fatigue, fever and unexpected weight change.  HENT:   Negative for hearing loss, lump/mass and trouble swallowing.   Eyes:  Negative for eye problems and icterus.  Respiratory:  Negative for chest tightness, cough and shortness of breath.   Cardiovascular:  Negative for chest pain, leg swelling and palpitations.  Gastrointestinal:  Negative for abdominal distention, abdominal pain, constipation, diarrhea, nausea and vomiting.  Endocrine: Negative for hot flashes.  Genitourinary:  Negative for difficulty urinating.   Musculoskeletal:  Negative for arthralgias.  Skin:  Negative for itching and rash.  Neurological:  Negative for dizziness, extremity weakness, headaches and numbness.  Hematological:  Negative for adenopathy. Does not bruise/bleed easily.  Psychiatric/Behavioral:  Negative for depression. The patient is not nervous/anxious.       PHYSICAL EXAMINATION  Patient sounds well, in no apparent distress, mood and behavior are normal, speech is normal.   LABORATORY DATA:  CBC    Component Value Date/Time   WBC 4.4 11/25/2023 1259   RBC 5.33 (H) 11/25/2023 1259   HGB 11.9 (L) 11/25/2023 1259   HGB 10.8 (L) 05/18/2023 0736   HCT 37.4 11/25/2023 1259   PLT 215.0 11/25/2023 1259   PLT 303  05/18/2023 0736   MCV 70.1 (L) 11/25/2023 1259   MCH 22.0 (L) 05/18/2023 0736   MCHC 31.8 11/25/2023 1259   RDW 16.6 (H) 11/25/2023 1259   LYMPHSABS 1.5 11/25/2023 1259   MONOABS 0.5 11/25/2023 1259   EOSABS 0.1 11/25/2023 1259   BASOSABS 0.1 11/25/2023 1259    CMP     Component Value Date/Time   NA 138 11/25/2023 1259   K 3.9 11/25/2023 1259   CL 104 11/25/2023 1259   CO2 25 11/25/2023 1259   GLUCOSE 87 11/25/2023 1259   BUN 13 11/25/2023 1259   CREATININE  0.67 11/25/2023 1259   CREATININE 0.63 05/18/2023 0736   CALCIUM 8.7 11/25/2023 1259   PROT 6.8 11/25/2023 1259   ALBUMIN 3.8 11/25/2023 1259   AST 21 11/25/2023 1259   AST 19 05/18/2023 0736   ALT 14 11/25/2023 1259   ALT 8 05/18/2023 0736   ALKPHOS 88 11/25/2023 1259   BILITOT 0.3 11/25/2023 1259   BILITOT 0.3 05/18/2023 0736   GFRNONAA >60 05/18/2023 0736     ASSESSMENT and THERAPY PLAN:   Malignant neoplasm of upper-outer quadrant of left breast in female, estrogen receptor positive (HCC) 02/14/2023: Left lumpectomy: 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 Oncotype DX recurrence score: 36 (distant recurrence at 9 years: 24%)   Recommendation: Adjuvant chemotherapy with Taxotere  and Cytoxan  every 3 weeks x 4 completed 05/18/2023 Adjuvant radiation therapy completed 07/22/2023 Adjuvant antiestrogen therapy started 08/09/2023 with tamoxifen  (patient had menstrual cycles until chemo) -------------------------------------------------------------------------------------------------------- Tamoxifen  Toxicities:  Assessment and Plan Assessment & Plan Invasive ductal carcinoma of left breast, status post lumpectomy, adjuvant chemotherapy, and radiation, on antiestrogen therapy Invasive ductal carcinoma treated with lumpectomy, chemotherapy, and radiation. On tamoxifen  since October 2024. She has been without menstrual cycle or any vaginal  bleeding since May 2024, sensitive estradiol  6.8.  Consider Anastrozole, risks and benefits discussed.  - After discussion, patient with remain on Tamoxifen  due to her excellent tolerance. - Consider rechecking FSH and estradiol  next year and continued discussion of switch to Anastrozole - Patient opted to pursue Guardant Reveal testing every 6 months  - Patient was recommended to continue annual mammograms, healthy diet, and exercise.       Follow up instructions:    -Return to cancer center 04/2024 for f/u with Dr. Odean   The patient was provided an opportunity to ask questions and all were answered. The patient agreed with the plan and demonstrated an understanding of the instructions.   The patient was advised to call back or seek an in-person evaluation if the symptoms worsen or if the condition fails to improve as anticipated.   I provided 10 minutes of non face-to-face telephone visit time during this encounter, and > 50% was spent counseling as documented under my assessment & plan.  Morna Kendall, NP 06/06/24 9:24 AM Medical Oncology and Hematology [facility] [address] [place], [place] [address] Tel. [phone]    Fax. [phone]  *Total Encounter Time as defined by the Centers for Medicare and Medicaid Services includes, in addition to the face-to-face time of a patient visit (documented in the note above) non-face-to-face time: obtaining and reviewing outside history, ordering and reviewing medications, tests or procedures, care coordination (communications with other health care professionals or caregivers) and documentation in the medical record.

## 2024-06-07 ENCOUNTER — Telehealth: Payer: Self-pay

## 2024-06-07 NOTE — Telephone Encounter (Signed)
 Message from Morna Kendall, DNP: will you call patient and see if she is willing to come two weeks before her appt with Dr Gudena for repeat [facility], LH, and estradiol  at that time.  Attempted to call pt. LVM for call back so we can offer lab appt 2 weeks prior to MD visit.

## 2024-06-18 ENCOUNTER — Ambulatory Visit: Attending: Hematology and Oncology

## 2024-06-18 VITALS — Wt 211.5 lb

## 2024-06-18 DIAGNOSIS — Z483 Aftercare following surgery for neoplasm: Secondary | ICD-10-CM | POA: Insufficient documentation

## 2024-06-18 NOTE — Therapy (Signed)
 OUTPATIENT PHYSICAL THERAPY SOZO SCREENING NOTE   Patient Name: Sue Beasley MRN: [id number] DOB:1971-04-08, 53 y.o., female Today's Date: 06/18/2024  PCP: Norleen Lynwood ORN, MD REFERRING PROVIDER: Odean Potts, MD   PT End of Session - 06/18/24 1642     Visit Number 5   # unchanged due to screen only   PT Start Time 1640    PT Stop Time 1644    PT Time Calculation (min) 4 min    Activity Tolerance Patient tolerated treatment well    Behavior During Therapy [facility] for tasks assessed/performed          Past Medical History:  Diagnosis Date   ALLERGIC RHINITIS 11/06/2009   Allergy    Anemia    ANEMIA-IRON DEFICIENCY 11/20/2009   Asthma    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 Lynwood Nasuti   Irreducible umbilical hernia 03/01/2011   Personal history of radiation therapy    Wears glasses    Past Surgical History:  Procedure Laterality Date   BREAST BIOPSY Left 01/14/2023   US  LT BREAST BX W LOC DEV 1ST LESION IMG BX SPEC US  GUIDE 01/14/2023 [facility]   BREAST BIOPSY  02/10/2023   MM LT RADIOACTIVE SEED LOC MAMMO GUIDE 02/10/2023 [facility]   BREAST LUMPECTOMY WITH RADIOACTIVE SEED AND SENTINEL LYMPH NODE BIOPSY Left 02/14/2023   Procedure: LEFT BREAST LUMPECTOMY WITH RADIOACTIVE SEED AND AXILLARY SENTINEL LYMPH NODE BIOPSY;  Surgeon: Ebbie Cough, MD;  Location: [facility];  Service: General;  Laterality: Left;   PORT-A-CATH REMOVAL N/A 05/31/2023   Procedure: REMOVAL PORT-A-CATH;  Surgeon: Ebbie Cough, MD;  Location:  SURGERY CENTER;  Service: General;  Laterality: N/A;   PORTACATH PLACEMENT Right 03/15/2023   Procedure: INSERTION PORT-A-CATH;  Surgeon: Ebbie Cough, MD;  Location: [facility] OR;  Service: General;  Laterality: Right;   UMBILICAL HERNIA REPAIR  05/13/11   Patient Active Problem List   Diagnosis Date Noted   B12  deficiency 11/29/2023   Port-A-Cath in place 03/23/2023   Genetic testing 02/04/2023   Malignant neoplasm of upper-outer quadrant of left breast in female, estrogen receptor positive (HCC) 01/24/2023   Low vitamin D  level 11/19/2021   Incontinence 12/19/2020   Low mean corpuscular volume (MCV) 11/23/2020   Pain and swelling of wrist, left 03/14/2020   Hyperglycemia 11/09/2019   Uterine leiomyoma 11/09/2019   Acute upper respiratory infection 02/12/2019   Increased body mass index 11/02/2018   Obesity 08/02/2014   Heart murmur, systolic 08/01/2014   Encounter for well adult exam with abnormal findings 03/01/2011   Hyperlipidemia 11/20/2009   Iron deficiency anemia 11/20/2009   Allergic rhinitis 11/06/2009   Asthma 11/06/2009    REFERRING DIAG: left breast cancer at risk for lymphedema  THERAPY DIAG: Aftercare following surgery for neoplasm  PERTINENT HISTORY: 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 It measures 1.1 cm and is located in the upper outer quadrant. It is ER/PR positive HER2 negative with a Ki67 of 70%. left lumpectomy and SLNB on 02/14/23 with completion of chemotherapy with TC and radiation. 8 negative nodes removed. 1 aspiration in the axilla of 75ml   PRECAUTIONS: left UE Lymphedema risk, None  SUBJECTIVE: Pt returns for her 3 month L-Dex screen.   PAIN:  Are you having pain? No  SOZO SCREENING: Patient was assessed today using the  SOZO machine to determine the lymphedema index score. This was compared to her baseline score. It was determined that she is within the recommended range when compared to her baseline and no further action is needed at this time. She will continue SOZO screenings. These are done every 3 months for 2 years post operatively followed by every 6 months for 2 years, and then annually.   L-DEX FLOWSHEETS - 06/18/24 1600       L-DEX LYMPHEDEMA SCREENING   Measurement Type Unilateral    L-DEX MEASUREMENT EXTREMITY Upper  Extremity    POSITION  Standing    DOMINANT SIDE Left    At Risk Side Left    BASELINE SCORE (UNILATERAL) -1.6    L-DEX SCORE (UNILATERAL) -4.1    VALUE CHANGE (UNILAT) -2.5            Aden Berwyn Caldron, PTA 06/18/2024, 4:43 PM

## 2024-07-10 ENCOUNTER — Telehealth: Payer: Self-pay

## 2024-07-10 NOTE — Telephone Encounter (Signed)
 As per Morna Kendall NP, called patient to relay results of r her Guardant Reveal they were negative, patient had no further questions at this time and she understood the message.

## 2024-07-12 ENCOUNTER — Encounter: Payer: Self-pay | Admitting: Internal Medicine

## 2024-07-12 ENCOUNTER — Ambulatory Visit: Admitting: Internal Medicine

## 2024-07-12 ENCOUNTER — Encounter: Payer: Self-pay | Admitting: Adult Health

## 2024-07-12 VITALS — BP 124/78 | HR 78 | Temp 98.2°F | Ht 61.0 in | Wt 214.6 lb

## 2024-07-12 DIAGNOSIS — J3489 Other specified disorders of nose and nasal sinuses: Secondary | ICD-10-CM | POA: Diagnosis not present

## 2024-07-12 DIAGNOSIS — E7800 Pure hypercholesterolemia, unspecified: Secondary | ICD-10-CM

## 2024-07-12 DIAGNOSIS — R7989 Other specified abnormal findings of blood chemistry: Secondary | ICD-10-CM | POA: Diagnosis not present

## 2024-07-12 DIAGNOSIS — R739 Hyperglycemia, unspecified: Secondary | ICD-10-CM

## 2024-07-12 DIAGNOSIS — E538 Deficiency of other specified B group vitamins: Secondary | ICD-10-CM | POA: Diagnosis not present

## 2024-07-12 MED ORDER — METHYLPREDNISOLONE 4 MG PO TBPK: ORAL_TABLET | ORAL | 0 refills | Status: AC

## 2024-07-12 NOTE — Patient Instructions (Addendum)
 Please take all new medication as prescribed-  the medrol  especially if the CT is abnormal  Please continue all other medications as before, and refills have been done if requested.  Please have the pharmacy call with any other refills you may need.  Please continue your efforts at being more active, low cholesterol diet, and weight control.  Please keep your appointments with your specialists as you may have planned  You will be contacted regarding the referral for: CT sinus  You will be contacted by phone if any changes need to be made immediately.  Otherwise, you will receive a letter about your results with an explanation, but please check with MyChart first.  Please make an Appointment to return in Feb 6, or sooner if needed, also with Lab Appointment for testing done 3-5 days before at the FIRST FLOOR Lab (so this is for TWO appointments - please see the scheduling desk as you leave)

## 2024-07-12 NOTE — Progress Notes (Signed)
 Patient ID: Sue Beasley, female   DOB: 03/13/1971, 53 y.o.   MRN: [id number]        Chief Complaint: follow up left preauricular pressure / left sinus swelling, allergies, low vit d, hld, hyperglycemia, low vit b12       HPI:  Sue Beasley is a 53 y.o. female here with c/o over 1 mo left sided preauricular pressure like sensation that persists contant mild, though can't really say it is pain, and persists despite change in hair products, glasses adjusted and even a full physician eye exam.  Denies fever, ST, cough, wheeze or sob.  No vertigo or hearing loss.  Did have a remote fall years ago the same area but has not had trouble until recent.  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 Does have several wks ongoing nasal allergy symptoms with clearish congestion, itch and sneezing, without fever, pain, ST, cough, or wheezing. No vertigo or ear pain.    Wt Readings from Last 3 Encounters:  07/12/24 214 lb 9.6 oz (97.3 kg)  06/18/24 211 lb 8 oz (95.9 kg)  05/15/24 213 lb 6.4 oz (96.8 kg)   BP Readings from Last 3 Encounters:  07/12/24 124/78  05/15/24 (!) 133/52  11/29/23 122/78         Past Medical History:  Diagnosis Date   ALLERGIC RHINITIS 11/06/2009   Allergy    Anemia    ANEMIA-IRON DEFICIENCY 11/20/2009   Asthma    Cancer (HCC) 12/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 Lynwood Nasuti   Irreducible umbilical hernia 03/01/2011   Personal history of radiation therapy    Wears glasses    Past Surgical History:  Procedure Laterality Date   BREAST BIOPSY Left 01/14/2023   US  LT BREAST BX W LOC DEV 1ST LESION IMG BX SPEC US  GUIDE 01/14/2023 [facility]   BREAST BIOPSY  02/10/2023   MM LT RADIOACTIVE SEED LOC MAMMO GUIDE 02/10/2023 [facility]   BREAST LUMPECTOMY  WITH RADIOACTIVE SEED AND SENTINEL LYMPH NODE BIOPSY Left 02/14/2023   Procedure: LEFT BREAST LUMPECTOMY WITH RADIOACTIVE SEED AND AXILLARY SENTINEL LYMPH NODE BIOPSY;  Surgeon: Ebbie Cough, MD;  Location: [facility];  Service: General;  Laterality: Left;   HERNIA REPAIR  03/2011   PORT-A-CATH REMOVAL N/A 05/31/2023   Procedure: REMOVAL PORT-A-CATH;  Surgeon: Ebbie Cough, MD;  Location: [facility];  Service: General;  Laterality: N/A;   PORTACATH PLACEMENT Right 03/15/2023   Procedure: INSERTION PORT-A-CATH;  Surgeon: Ebbie Cough, MD;  Location: [facility] OR;  Service: General;  Laterality: Right;   UMBILICAL HERNIA REPAIR  05/13/2011    reports that she has never smoked. She has never used smokeless tobacco. She reports that she does not drink alcohol and does not use drugs. family history includes Colon polyps in her sister; Diabetes in her father; Esophageal cancer (age of onset: 7) in her maternal uncle; Heart disease in her father and mother; Hyperlipidemia in her mother; Hypertension in her father; Miscarriages / [place] in her sister; Pancreatic cancer (age of onset: 70) in her cousin; Prostate cancer (age of onset: 30) in her cousin. Allergies  Allergen Reactions   Peanut-Containing Drug Products Anaphylaxis   Soy Allergy (Obsolete)  Nausea Only   Egg-Derived Products Other (See Comments)    Upset stomach   Oxycodone  Other (See Comments)    Tongue tingling    Amoxicillin Hives and Rash   Amoxicillin-Pot Clavulanate Rash   Shellfish-Derived Products Itching and Rash   Current Outpatient Medications on File Prior to Visit  Medication Sig Dispense Refill   albuterol (VENTOLIN HFA) 108 (90 Base) MCG/ACT inhaler Inhale 2 puffs into the lungs every 6 (six) hours as needed for shortness of breath or wheezing.     azelastine (ASTELIN) 0.1 % nasal spray Place 1 spray into both nostrils in the morning.  5   desoximetasone (TOPICORT) 0.25 %  cream Apply 1 Application topically 2 (two) times daily as needed (eczema).     diphenhydrAMINE (BENADRYL) 25 MG tablet Take 25 mg by mouth daily as needed (food allergies (with restaurant eating)).     dorzolamide-timolol (COSOPT) 22.3-6.8 MG/ML ophthalmic solution Place 1 drop into both eyes 2 (two) times daily.     EPINEPHrine  0.3 mg/0.3 mL IJ SOAJ injection Inject 0.3 mg into the muscle as needed for anaphylaxis.     ferrous sulfate 325 (65 FE) MG tablet Take 325 mg by mouth 3 (three) times a week.     fexofenadine (ALLEGRA ODT) 30 MG disintegrating tablet Take 30 mg by mouth daily.     fluticasone (FLONASE) 50 MCG/ACT nasal spray Place 2 sprays into both nostrils in the morning.  4   fluticasone-salmeterol (WIXELA INHUB) 250-50 MCG/ACT AEPB Inhale 1 puff into the lungs in the morning.     ketotifen (ZADITOR) 0.035 % ophthalmic solution Place 1-2 drops into both eyes 2 (two) times daily as needed (allergies.).     latanoprost (XALATAN) 0.005 % ophthalmic solution Place 1 drop into both eyes at bedtime.     tamoxifen  (NOLVADEX ) 20 MG tablet Take 1 tablet (20 mg total) by mouth daily. 90 tablet 3   No current facility-administered medications on file prior to visit.        ROS:  All others reviewed and negative.  Objective        PE:  BP 124/78   Pulse 78   Temp 98.2 F (36.8 C)   Ht 5' 1 (1.549 m)   Wt 214 lb 9.6 oz (97.3 kg)   SpO2 98%   BMI 40.55 kg/m                 Constitutional: Pt appears in NAD               HENT: Head: NCAT.                Right Ear: External ear normal.                 Left Ear: External ear normal.                Eyes: . Pupils are equal, round, and reactive to light. Conjunctivae and EOM are normal               Nose: without d/c or deformity               Has subtle left maxillary sinus swelling to the left preauricular area               Neck: Neck supple. Gross normal ROM               Cardiovascular: Normal rate and regular rhythm.  Pulmonary/Chest: Effort normal and breath sounds without rales or wheezing.                               Neurological: Pt is alert. At baseline orientation, motor grossly intact               Skin: Skin is warm. No rashes, no other new lesions, LE edema - none               Psychiatric: Pt behavior is normal without agitation   Micro: none  Cardiac tracings I have personally interpreted today:  none  Pertinent Radiological findings (summarize): none   Lab Results  Component Value Date   WBC 4.4 11/25/2023   HGB 11.9 (L) 11/25/2023   HCT 37.4 11/25/2023   PLT 215.0 11/25/2023   GLUCOSE 87 11/25/2023   CHOL 185 11/25/2023   TRIG 126.0 11/25/2023   HDL 56.10 11/25/2023   LDLDIRECT 141.6 11/06/2009   LDLCALC 103 (H) 11/25/2023   ALT 14 11/25/2023   AST 21 11/25/2023   NA 138 11/25/2023   K 3.9 11/25/2023   CL 104 11/25/2023   CREATININE 0.67 11/25/2023   BUN 13 11/25/2023   CO2 25 11/25/2023   TSH 1.33 11/25/2023   HGBA1C 6.2 11/25/2023   Assessment/Plan:  Sue Beasley is a 53 y.o. Black or African American [2] female with  has a past medical history of ALLERGIC RHINITIS (11/06/2009), Allergy, Anemia, ANEMIA-IRON DEFICIENCY (11/20/2009), Asthma, Cancer (HCC) (12/2022), Eczema, Glaucoma (11/09/23), Heart murmur, History of radiation therapy, Irreducible umbilical hernia (03/01/2011), Personal history of radiation therapy, and Wears glasses.  Pain of maxillary sinus Does not appear ill but has subtle left maxillary swelling and mild discomfort, afeb - for medrol  pack for probable allergy flare, for CT sinus r/o obstruction or other  Low vitamin D  level Last vitamin D  Lab Results  Component Value Date   VD25OH 10.72 (L) 11/25/2023   Low, to start  oral replacement   Hyperlipidemia Lab Results  Component Value Date   LDLCALC 103 (H) 11/25/2023   uncontrolled, pt for lower chol diet, declines statin   Hyperglycemia Lab Results  Component Value Date    HGBA1C 6.2 11/25/2023   Stable, pt to continue current medical treatment  - diet, wt control   B12 deficiency Lab Results  Component Value Date   VITAMINB12 220 11/25/2023   Low, to start oral replacement - b12 1000 mcg qd  Followup: Return in about 5 months (around 11/30/2024).  Lynwood Rush, MD 07/15/2024 12:30 PM [facility]

## 2024-07-15 ENCOUNTER — Encounter: Payer: Self-pay | Admitting: Internal Medicine

## 2024-07-15 NOTE — Assessment & Plan Note (Signed)
 Last vitamin D  Lab Results  Component Value Date   VD25OH 10.72 (L) 11/25/2023   Low, to start oral replacement

## 2024-07-15 NOTE — Assessment & Plan Note (Signed)
 Lab Results  Component Value Date   HGBA1C 6.2 11/25/2023   Stable, pt to continue current medical treatment  - diet,wt control

## 2024-07-15 NOTE — Assessment & Plan Note (Signed)
 Lab Results  Component Value Date   VITAMINB12 220 11/25/2023   Low, to start oral replacement - b12 1000 mcg qd

## 2024-07-15 NOTE — Assessment & Plan Note (Addendum)
 Does not appear ill but has subtle left maxillary swelling and mild discomfort, afeb - for medrol  pack for probable allergy flare, for CT sinus r/o obstruction or other

## 2024-07-15 NOTE — Assessment & Plan Note (Signed)
 Lab Results  Component Value Date   LDLCALC 103 (H) 11/25/2023   uncontrolled, pt for lower chol diet, declines statin

## 2024-07-18 ENCOUNTER — Inpatient Hospital Stay
Admission: RE | Admit: 2024-07-18 | Discharge: 2024-07-18 | Discharge status: Home / Self Care | Source: Ambulatory Visit | Attending: Internal Medicine | Admitting: Internal Medicine

## 2024-07-18 DIAGNOSIS — J3489 Other specified disorders of nose and nasal sinuses: Secondary | ICD-10-CM

## 2024-07-22 ENCOUNTER — Other Ambulatory Visit: Payer: Self-pay | Admitting: Internal Medicine

## 2024-07-22 ENCOUNTER — Ambulatory Visit: Payer: Self-pay | Admitting: Internal Medicine

## 2024-07-22 DIAGNOSIS — J338 Other polyp of sinus: Secondary | ICD-10-CM

## 2024-08-07 NOTE — Telephone Encounter (Signed)
 Spoke w/pt letting her know that on 06/12/24 2 month supply of Dorz-Timolol was sent to [facility] on [address] with 3 refills so I let pt know to call the pharmacy for refill  -Pt expressed understanding

## 2024-08-13 ENCOUNTER — Telehealth: Payer: Self-pay

## 2024-08-13 ENCOUNTER — Other Ambulatory Visit: Payer: Self-pay

## 2024-08-13 DIAGNOSIS — Z170 Estrogen receptor positive status [ER+]: Secondary | ICD-10-CM

## 2024-08-13 NOTE — Telephone Encounter (Signed)
 Received confirmation of successful fax transmission of signed orders.  Patient aware of new order sent to [facility].

## 2024-08-13 NOTE — Telephone Encounter (Signed)
 S/w patient about increased lymphedema in left arm near elbow. Patient has known lymphedema. Last Sozo test done in August was normal. Patient noted increased activity and carrying items this past week, which may have contributed to the increased edema. No pain or redness noted at site.  Dr. Odean aware of the situation and recommends patient to undergo additional PT. Referral sent in and patient aware that someone from the PT office will be reaching out to her in the next few business days to get her scheduled. In the meantime, patient recommended to continue lymphedema exercises, limit carrying heavy items on that side, and to monitor for redness, increased edema, or pain.  Patient verbalized an understanding of the information and voiced appreciation for the call.

## 2024-08-15 NOTE — Therapy (Signed)
 OUTPATIENT PHYSICAL THERAPY  UPPER EXTREMITY ONCOLOGY EVALUATION  Patient Name: Sue Beasley MRN: [id number] DOB:1970/11/02, 53 y.o., female Today's Date: 08/16/2024  END OF SESSION:  PT End of Session - 08/16/24 0957     Visit Number 1    Number of Visits 4    Date for Recertification  09/06/24    Authorization Type no auth    PT Start Time 0802    PT Stop Time 0850    PT Time Calculation (min) 48 min    Activity Tolerance Patient tolerated treatment well    Behavior During Therapy [facility] for tasks assessed/performed          Past Medical History:  Diagnosis Date   ALLERGIC RHINITIS 11/06/2009   Allergy    Anemia    ANEMIA-IRON DEFICIENCY 11/20/2009   Asthma    Cancer (HCC) 12/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 Lynwood Nasuti   Irreducible umbilical hernia 03/01/2011   Personal history of radiation therapy    Wears glasses    Past Surgical History:  Procedure Laterality Date   BREAST BIOPSY Left 01/14/2023   US  LT BREAST BX W LOC DEV 1ST LESION IMG BX SPEC US  GUIDE 01/14/2023 [facility]   BREAST BIOPSY  02/10/2023   MM LT RADIOACTIVE SEED LOC MAMMO GUIDE 02/10/2023 [facility]   BREAST LUMPECTOMY WITH RADIOACTIVE SEED AND SENTINEL LYMPH NODE BIOPSY Left 02/14/2023   Procedure: LEFT BREAST LUMPECTOMY WITH RADIOACTIVE SEED AND AXILLARY SENTINEL LYMPH NODE BIOPSY;  Surgeon: Ebbie Cough, MD;  Location: [facility];  Service: General;  Laterality: Left;   HERNIA REPAIR  03/2011   PORT-A-CATH REMOVAL N/A 05/31/2023   Procedure: REMOVAL PORT-A-CATH;  Surgeon: Ebbie Cough, MD;  Location: [facility];  Service: General;  Laterality: N/A;   PORTACATH PLACEMENT Right 03/15/2023   Procedure: INSERTION PORT-A-CATH;  Surgeon: Ebbie Cough, MD;  Location: [facility];  Service: General;  Laterality: Right;   UMBILICAL  HERNIA REPAIR  05/13/2011   Patient Active Problem List   Diagnosis Date Noted   Pain of maxillary sinus 07/12/2024   B12 deficiency 11/29/2023   Port-A-Cath in place 03/23/2023   Genetic testing 02/04/2023   Malignant neoplasm of upper-outer quadrant of left breast in female, estrogen receptor positive (HCC) 01/24/2023   Low vitamin D  level 11/19/2021   Incontinence 12/19/2020   Low mean corpuscular volume (MCV) 11/23/2020   Pain and swelling of wrist, left 03/14/2020   Hyperglycemia 11/09/2019   Uterine leiomyoma 11/09/2019   Acute upper respiratory infection 02/12/2019   Increased body mass index 11/02/2018   Obesity 08/02/2014   Heart murmur, systolic 08/01/2014   Encounter for well adult exam with abnormal findings 03/01/2011   Hyperlipidemia 11/20/2009   Iron deficiency anemia 11/20/2009   Allergic rhinitis 11/06/2009   Asthma 11/06/2009    PCP: Norleen Lynwood ORN., MD  REFERRING PROVIDER: Odean Potts, MD   REFERRING DIAG:  [phone] (ICD-10-CM) - Malignant neoplasm of upper-outer quadrant of left breast in female, estrogen receptor positive (HCC)    THERAPY DIAG:  Malignant neoplasm of upper-outer quadrant of left breast in female, estrogen receptor positive (HCC)  Abnormal posture  At risk for lymphedema  ONSET DATE: 08/13/24  Rationale for Evaluation and Treatment: Rehabilitation  SUBJECTIVE:  SUBJECTIVE STATEMENT:  Patient reports she has noticed some swelling near the elbow and tightness in the forearm. The swelling has improved since the beginning of the week when it began but is still noticeable. The patient is left handed so she does use that arm for most ADL's. She has been doing strength training twice a week and increased her weight on the Monday which could have  contributed to the swelling. The patient also attended homecoming for [employer] a few weeks ago where she did a lot of walking.   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 It measures 1.1 cm and is located in the upper outer quadrant. It is ER/PR positive HER2 negative with a Ki67 of 70%. left lumpectomy and SLNB on 02/14/23 with completion of chemotherapy with TC and radiation. 8 negative nodes removed. 1 aspiration in the axilla of 75ml   PAIN:  Are you having pain? No - feels tightness in the forearm and elbow  PRECAUTIONS: None  RED FLAGS: None   WEIGHT BEARING RESTRICTIONS: No  FALLS:  Has patient fallen in last 6 months? No  LIVING ENVIRONMENT: Lives with: lives alone Lives in: House/apartment  OCCUPATION: Math professor at [employer]   LEISURE: strength training & walking  HAND DOMINANCE: left   PRIOR LEVEL OF FUNCTION: Independent  PATIENT GOALS: reduce swelling  OBJECTIVE: Note: Objective measures were completed at Evaluation unless otherwise noted.  COGNITION: Overall cognitive status: Within functional limits for tasks assessed   PALPATION:Cording at the axilla  OBSERVATIONS / OTHER ASSESSMENTS: Increased edema observed at the Lt elbow and slightly above  UPPER EXTREMITY AROM/PROM:  A/PROM RIGHT   eval   Shoulder extension WFL  Shoulder flexion [facility]  Shoulder abduction [facility]  Shoulder internal rotation [facility]  Shoulder external rotation WFL - tightness in elbow    (Blank rows = not tested)  A/PROM LEFT   eval  Shoulder extension WFL  Shoulder flexion WFL  Shoulder abduction [facility]  Shoulder internal rotation [facility]  Shoulder external rotation WFL - tightness in elbow    (Blank rows = not tested)   UPPER EXTREMITY STRENGTH: WFL   LYMPHEDEMA ASSESSMENTS:   LANDMARK RIGHT  eval  At axilla    15 cm proximal to the proximal aspect of the olecranon process   10 cm proximal to the proximal aspect of the olecranon process   Olecranon  process   15 cm proximal to the proximal aspect of the ulnar styloid process   10 cm proximal to the proximal aspect of the ulnar styloid process   Just distal to the ulnar styloid process   Across hand at thumb web space   At base of 2nd digit   (Blank rows = not tested)  LANDMARK LEFT  eval  At axilla  41.3  15 cm proximal to the proximal aspect of the olecranon process   10 cm proximal to the proximal aspect of the olecranon process   Olecranon process 27.7  15 cm proximal to the proximal aspect of the ulnar styloid process   10 cm proximal to  the proximal aspect of the ulnar styloid process   Just distal to the ulnar styloid process 15.8  Across hand at thumb web space   At base of 2nd digit   (Blank rows = not tested)  Chest circumference just inferior to the axillae:  Chest circumference at the largest point:    L-DEX LYMPHEDEMA SCREENING: The patient was assessed using the  L-Dex machine today to produce a lymphedema index baseline score. The patient will be reassessed on a regular basis (typically every 3 months) to obtain new L-Dex scores. If the score is > 6.5 points away from his/her baseline score indicating onset of subclinical lymphedema, it will be recommended to wear a compression garment for 4 weeks, 12 hours per day and then be reassessed. If the score continues to be > 6.5 points from baseline at reassessment, we will initiate lymphedema treatment. Assessing in this manner has a 95% rate of preventing clinically significant lymphedema.                                                                                                                          TREATMENT DATE:  08/16/24 Completed initial evaluation Pt education on ordering a compression sleeve, wearing it when she is weight training or will be increasing her activity Demonstrated and explained how to perform MLD for the Lt arm STM for cording at the antecubital fossa and forearm Doorway stretch 2 x 30  seconds  Standing pec stretch at wall x 30 seconds Standing wrist extension stretch x 30 seconds  PATIENT EDUCATION:  Education details: POC, compression sleeve, MLD for breast and Lt arm, HEP Person educated: Patient Education method: Explanation, Demonstration, and Handouts Education comprehension: verbalized understanding and returned demonstration  HOME EXERCISE PROGRAM: Access Code: [email or web address] URL: [email or web address] Date: 08/16/2024 Prepared by: Saddie Raw  Exercises - Single Arm Doorway Pec Stretch at 90 Degrees Abduction - 2 x daily - 7 x weekly - 3 reps - 20-30 second hold - Standing Pec Stretch at Wall - 2 x daily - 7 x weekly - 3 reps - 20-30 seconds hold - Standing Wrist Extension Stretch - 2 x daily - 7 x weekly - 3 reps - 20-30 seconds hold   ASSESSMENT:  CLINICAL IMPRESSION: Patient is a 53 y.o. female who was seen today for physical therapy evaluation and treatment for increased swelling of the left arm. SOZO screening is completed which did not show an increase in arm lymphedema however, edema is observed locally at the left elbow. The patient is measured for a compression sleeve and given a printout with information on various places where she can order the sleeve, which size and level of compression to purchase. The patient is advised to wear the compression sleeve when she completes weight training, especially if increasing the weight, or if going on a longer walk. Patient does not have deficits with shoulder AROM however, she reports tightness/stretching with shoulder ER and when reaching behind her. Patient would benefit from continued skilled physical therapy once a week for 3 weeks to decrease cording and improve localized edema at the elbow.    OBJECTIVE IMPAIRMENTS: decreased knowledge of condition, increased edema, and increased fascial restrictions.   ACTIVITY LIMITATIONS: exercise including strength training  PARTICIPATION LIMITATIONS:  community activity  PERSONAL FACTORS: 1 comorbidity: radiation history, SNLB are also affecting patient's functional outcome.   REHAB  POTENTIAL: Excellent  CLINICAL DECISION MAKING: Stable/uncomplicated  EVALUATION COMPLEXITY: Low  GOALS: Goals reviewed with patient? Yes  GOALS: Short term = long term goals Target date: 09/06/24  Patient will obtain a compression sleeve to reduce the risk of lymphedema when increasing activity or exercising  Baseline: Goal status: INITIAL  2.  Patient will be independent with Lt arm MLD to reduce swelling at the Lt elbow Baseline:  Goal status: INITIAL  3.  Patient will reduce cording in the Lt elbow by 50% in order to reduce the tightness and pulling felt with functional movements  Baseline:  Goal status: INITIAL  PLAN:  PT FREQUENCY: 1x/week  PT DURATION: 3 weeks  PLANNED INTERVENTIONS: 97164- PT Re-evaluation, 97110-Therapeutic exercises, 97530- Therapeutic activity, 97140- Manual therapy, Patient/Family education, Balance training, Joint mobilization, Manual lymph drainage, Scar mobilization, Compression bandaging, DME instructions, Therapeutic exercises, Therapeutic activity, Neuromuscular re-education, Gait training, and Self Care  PLAN FOR NEXT SESSION: Arm MLD, stretching, STM/IASTM for cording   Randall Pack, SPT  08/16/2024, 11:05 AM

## 2024-08-16 ENCOUNTER — Other Ambulatory Visit: Payer: Self-pay

## 2024-08-16 ENCOUNTER — Encounter: Payer: Self-pay | Admitting: Rehabilitation

## 2024-08-16 ENCOUNTER — Ambulatory Visit: Attending: Hematology and Oncology | Admitting: Rehabilitation

## 2024-08-16 DIAGNOSIS — Z9189 Other specified personal risk factors, not elsewhere classified: Secondary | ICD-10-CM | POA: Insufficient documentation

## 2024-08-16 DIAGNOSIS — C50412 Malignant neoplasm of upper-outer quadrant of left female breast: Secondary | ICD-10-CM | POA: Diagnosis present

## 2024-08-16 DIAGNOSIS — Z170 Estrogen receptor positive status [ER+]: Secondary | ICD-10-CM | POA: Insufficient documentation

## 2024-08-16 DIAGNOSIS — I890 Lymphedema, not elsewhere classified: Secondary | ICD-10-CM | POA: Insufficient documentation

## 2024-08-16 DIAGNOSIS — R293 Abnormal posture: Secondary | ICD-10-CM | POA: Insufficient documentation

## 2024-08-16 DIAGNOSIS — Z483 Aftercare following surgery for neoplasm: Secondary | ICD-10-CM | POA: Diagnosis present

## 2024-08-16 NOTE — Patient Instructions (Signed)
 Start with all of the steps of the breast MLD:    Then add the arm:    Copyright  VHI. All rights reserved.  Arm Posterior: Elbow to Shoulder - Sweep   1.) Pump _5__ times from back of elbow to top of shoulder.   2.) Then inner to outer upper arm _5_ times, then outer arm again _5_ times.      4.) 10 Circles in the elbow crease  5.)Pump or stationary circles _5__ times from wrist to elbow making sure to do both sides of the forearm.   ARM: Dorsum of Hand to Shoulder - Sweep   Then do all of your steps in reverse Copyright  VHI. All rights reserved.

## 2024-08-24 ENCOUNTER — Encounter: Payer: Self-pay | Admitting: Rehabilitation

## 2024-08-24 ENCOUNTER — Ambulatory Visit: Admitting: Rehabilitation

## 2024-08-24 DIAGNOSIS — I890 Lymphedema, not elsewhere classified: Secondary | ICD-10-CM

## 2024-08-24 DIAGNOSIS — Z483 Aftercare following surgery for neoplasm: Secondary | ICD-10-CM

## 2024-08-24 DIAGNOSIS — R293 Abnormal posture: Secondary | ICD-10-CM

## 2024-08-24 DIAGNOSIS — Z170 Estrogen receptor positive status [ER+]: Secondary | ICD-10-CM

## 2024-08-24 DIAGNOSIS — Z9189 Other specified personal risk factors, not elsewhere classified: Secondary | ICD-10-CM

## 2024-08-24 DIAGNOSIS — C50412 Malignant neoplasm of upper-outer quadrant of left female breast: Secondary | ICD-10-CM | POA: Diagnosis not present

## 2024-08-24 NOTE — Therapy (Signed)
 OUTPATIENT PHYSICAL THERAPY  UPPER EXTREMITY ONCOLOGY EVALUATION  Patient Name: Sue Beasley MRN: [id number] DOB:04/27/1971, 53 y.o., female Today's Date: 08/24/2024  END OF SESSION:    Past Medical History:  Diagnosis Date   ALLERGIC RHINITIS 11/06/2009   Allergy    Anemia    ANEMIA-IRON DEFICIENCY 11/20/2009   Asthma    Cancer (HCC) 12/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 Lynwood Nasuti   Irreducible umbilical hernia 03/01/2011   Personal history of radiation therapy    Wears glasses    Past Surgical History:  Procedure Laterality Date   BREAST BIOPSY Left 01/14/2023   US  LT BREAST BX W LOC DEV 1ST LESION IMG BX SPEC US  GUIDE 01/14/2023 [facility]   BREAST BIOPSY  02/10/2023   MM LT RADIOACTIVE SEED LOC MAMMO GUIDE 02/10/2023 [facility]   BREAST LUMPECTOMY WITH RADIOACTIVE SEED AND SENTINEL LYMPH NODE BIOPSY Left 02/14/2023   Procedure: LEFT BREAST LUMPECTOMY WITH RADIOACTIVE SEED AND AXILLARY SENTINEL LYMPH NODE BIOPSY;  Surgeon: Ebbie Cough, MD;  Location: [facility];  Service: General;  Laterality: Left;   HERNIA REPAIR  03/2011   PORT-A-CATH REMOVAL N/A 05/31/2023   Procedure: REMOVAL PORT-A-CATH;  Surgeon: Ebbie Cough, MD;  Location: [facility];  Service: General;  Laterality: N/A;   PORTACATH PLACEMENT Right 03/15/2023   Procedure: INSERTION PORT-A-CATH;  Surgeon: Ebbie Cough, MD;  Location: [facility] OR;  Service: General;  Laterality: Right;   UMBILICAL HERNIA REPAIR  05/13/2011   Patient Active Problem List   Diagnosis Date Noted   Pain of maxillary sinus 07/12/2024   B12 deficiency 11/29/2023   Port-A-Cath in place 03/23/2023   Genetic testing 02/04/2023   Malignant neoplasm of upper-outer quadrant of left breast in female, estrogen receptor positive (HCC) 01/24/2023   Low vitamin D  level  11/19/2021   Incontinence 12/19/2020   Low mean corpuscular volume (MCV) 11/23/2020   Pain and swelling of wrist, left 03/14/2020   Hyperglycemia 11/09/2019   Uterine leiomyoma 11/09/2019   Acute upper respiratory infection 02/12/2019   Increased body mass index 11/02/2018   Obesity 08/02/2014   Heart murmur, systolic 08/01/2014   Encounter for well adult exam with abnormal findings 03/01/2011   Hyperlipidemia 11/20/2009   Iron deficiency anemia 11/20/2009   Allergic rhinitis 11/06/2009   Asthma 11/06/2009    PCP: Norleen Lynwood ORN., MD  REFERRING PROVIDER: Odean Potts, MD   REFERRING DIAG:  [phone] (ICD-10-CM) - Malignant neoplasm of upper-outer quadrant of left breast in female, estrogen receptor positive (HCC)    THERAPY DIAG:  No diagnosis found.  ONSET DATE: 08/13/24  Rationale for Evaluation and Treatment: Rehabilitation  SUBJECTIVE:  SUBJECTIVE STATEMENT:  The patient reports her arm is doing better but still feels like her elbow is swollen. She purchased a compression sleeve and wore it when she did a lot of walking, which did seem to help.    Eval: Patient reports she has noticed some swelling near the elbow and tightness in the forearm. The swelling has improved since the beginning of the week when it began but is still noticeable. The patient is left handed so she does use that arm for most ADL's. She has been doing strength training twice a week and increased her weight on the Monday which could have contributed to the swelling. The patient also attended homecoming for [employer] a few weeks ago where she did a lot of walking.   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 It measures 1.1 cm and is located in the upper outer quadrant. It is ER/PR positive  HER2 negative with a Ki67 of 70%. left lumpectomy and SLNB on 02/14/23 with completion of chemotherapy with TC and radiation. 8 negative nodes removed. 1 aspiration in the axilla of 75ml   PAIN:  Are you having pain? No - feels tightness in the forearm and elbow  PRECAUTIONS: None  RED FLAGS: None   WEIGHT BEARING RESTRICTIONS: No  FALLS:  Has patient fallen in last 6 months? 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  COGNITION: Overall cognitive status: Within functional limits for tasks assessed   PALPATION:Cording at the axilla  OBSERVATIONS / OTHER ASSESSMENTS: Increased edema observed at the Lt elbow and slightly above  UPPER EXTREMITY AROM/PROM:  A/PROM RIGHT   eval   Shoulder extension WFL  Shoulder flexion [facility]  Shoulder abduction [facility]  Shoulder internal rotation [facility]  Shoulder external rotation WFL - tightness in elbow    (Blank rows = not tested)  A/PROM LEFT   eval  Shoulder extension WFL  Shoulder flexion WFL  Shoulder abduction [facility]  Shoulder internal rotation [facility]  Shoulder external rotation WFL - tightness in elbow    (Blank rows = not tested)   UPPER EXTREMITY STRENGTH: WFL   LYMPHEDEMA ASSESSMENTS:   LANDMARK RIGHT  eval  At axilla    15 cm proximal to the proximal aspect of the olecranon process   10 cm proximal to the proximal aspect of the olecranon process   Olecranon process   15 cm proximal to the proximal aspect of the ulnar styloid process   10 cm proximal to the proximal aspect of the ulnar styloid process   Just distal to the ulnar styloid process   Across hand at thumb web space   At base of 2nd digit   (Blank rows = not  tested)  LANDMARK LEFT  eval  At axilla  41.3  15 cm proximal to the proximal aspect of the olecranon process   10 cm proximal to the proximal aspect of the olecranon process   Olecranon process 27.7  15 cm proximal to the proximal aspect of the ulnar styloid process   10 cm proximal to  the proximal aspect of the ulnar styloid process   Just distal to the ulnar styloid process 15.8  Across hand at thumb web space  At base of 2nd digit   (Blank rows = not tested)  Chest circumference just inferior to the axillae:  Chest circumference at the largest point:    L-DEX LYMPHEDEMA SCREENING: The patient was assessed using the L-Dex machine today to produce a lymphedema index baseline score. The patient will be reassessed on a regular basis (typically every 3 months) to obtain new L-Dex scores. If the score is > 6.5 points away from his/her baseline score indicating onset of subclinical lymphedema, it will be recommended to wear a compression garment for 4 weeks, 12 hours per day and then be reassessed. If the score continues to be > 6.5 points from baseline at reassessment, we will initiate lymphedema treatment. Assessing in this manner has a 95% rate of preventing clinically significant lymphedema.                                                                                                                          TREATMENT DATE:  08/24/24 With pt permission for arm MLD Therapeutic Exercise: Standing pec stretch at wall 3 x 30 seconds Standing wrist extension stretch 3 x 30 seconds Manual Therapy: STM for cording at the antecubital fossa, forearm, and axilla  In supine: Short neck, 5 diaphragmatic breaths, R axillary nodes and establishment of interaxillary pathway, L inguinal nodes and establishment of axilloinguinal pathway, then L UE working proximal to distal, moving fluid from upper inner arm outwards, and doing both sides of forearm moving fluid towards pathways spending extra time  in any areas of fibrosis then retracing all steps      08/16/24 Completed initial evaluation Pt education on ordering a compression sleeve, wearing it when she is weight training or will be increasing her activity Demonstrated and explained how to perform MLD for the Lt arm STM for cording at the antecubital fossa and forearm Doorway stretch 2 x 30 seconds  Standing pec stretch at wall x 30 seconds Standing wrist extension stretch x 30 seconds  PATIENT EDUCATION:  Education details: POC, compression sleeve, MLD for breast and Lt arm, HEP Person educated: Patient Education method: Explanation, Demonstration, and Handouts Education comprehension: verbalized understanding and returned demonstration  HOME EXERCISE PROGRAM: Access Code: [email or web address] URL: [email or web address] Date: 08/16/2024 Prepared by: Saddie Raw  Exercises - Single Arm Doorway Pec Stretch at 90 Degrees Abduction - 2 x daily - 7 x weekly - 3 reps - 20-30 second hold - Standing Pec Stretch at Wall - 2 x daily - 7 x weekly - 3 reps - 20-30 seconds hold - Standing Wrist Extension Stretch - 2 x daily - 7 x weekly - 3 reps - 20-30 seconds hold   ASSESSMENT:  CLINICAL IMPRESSION: Upper extremity MLD is continued with an emphasis on reducing the edema at the anticubital fossa. The patient tolerates the continuation of stretches focused on reducing cording at the left axilla. While the patient has been performing self MLD at home she reports that there are times she get confused  with the directions for the upper inner arm and forearm. Patient would benefit from continued skilled physical therapy once a week for 3 weeks to decrease cording and improve localized edema at the elbow.    OBJECTIVE IMPAIRMENTS: decreased knowledge of condition, increased edema, and increased fascial restrictions.   ACTIVITY LIMITATIONS: exercise including strength training  PARTICIPATION LIMITATIONS: community activity  PERSONAL  FACTORS: 1 comorbidity: radiation history, SNLB are also affecting patient's functional outcome.   REHAB POTENTIAL: Excellent  CLINICAL DECISION MAKING: Stable/uncomplicated  EVALUATION COMPLEXITY: Low  GOALS: Goals reviewed with patient? Yes  GOALS: Short term = long term goals Target date: 09/06/24  Patient will obtain a compression sleeve to reduce the risk of lymphedema when increasing activity or exercising  Baseline: Goal status: INITIAL  2.  Patient will be independent with Lt arm MLD to reduce swelling at the Lt elbow Baseline:  Goal status: INITIAL  3.  Patient will reduce cording in the Lt elbow by 50% in order to reduce the tightness and pulling felt with functional movements  Baseline:  Goal status: INITIAL  PLAN:  PT FREQUENCY: 1x/week  PT DURATION: 3 weeks  PLANNED INTERVENTIONS: 97164- PT Re-evaluation, 97110-Therapeutic exercises, 97530- Therapeutic activity, 97140- Manual therapy, Patient/Family education, Balance training, Joint mobilization, Manual lymph drainage, Scar mobilization, Compression bandaging, DME instructions, Therapeutic exercises, Therapeutic activity, Neuromuscular re-education, Gait training, and Self Care  PLAN FOR NEXT SESSION: Arm MLD, stretching, STM/IASTM for cording   Randall Pack, SPT  08/24/2024, 7:40 AM   I agree with the following treatment note after reviewing documentation. This session was performed under the supervision of a licensed clinician.  Saddie Raw, PT 08/24/24, 11:47 AM

## 2024-08-27 ENCOUNTER — Encounter (INDEPENDENT_AMBULATORY_CARE_PROVIDER_SITE_OTHER): Payer: Self-pay

## 2024-08-27 ENCOUNTER — Ambulatory Visit (INDEPENDENT_AMBULATORY_CARE_PROVIDER_SITE_OTHER)

## 2024-08-27 VITALS — BP 139/79 | HR 57 | Temp 98.2°F | Ht 61.0 in | Wt 213.0 lb

## 2024-08-27 DIAGNOSIS — J320 Chronic maxillary sinusitis: Secondary | ICD-10-CM | POA: Diagnosis not present

## 2024-08-27 DIAGNOSIS — M26629 Arthralgia of temporomandibular joint, unspecified side: Secondary | ICD-10-CM

## 2024-08-27 DIAGNOSIS — J3489 Other specified disorders of nose and nasal sinuses: Secondary | ICD-10-CM | POA: Diagnosis not present

## 2024-08-27 DIAGNOSIS — J343 Hypertrophy of nasal turbinates: Secondary | ICD-10-CM

## 2024-08-27 DIAGNOSIS — J342 Deviated nasal septum: Secondary | ICD-10-CM

## 2024-08-27 DIAGNOSIS — H9202 Otalgia, left ear: Secondary | ICD-10-CM | POA: Diagnosis not present

## 2024-08-27 NOTE — Patient Instructions (Signed)
                         Contains text generated by Abridge.                                 Contains text generated by Abridge.

## 2024-08-27 NOTE — Progress Notes (Signed)
 Dear Dr. Norleen, Here is my assessment for our mutual patient, Sue Beasley. Thank you for allowing me the opportunity to care for your patient. Please do not hesitate to contact me should you have any other questions. Sincerely, Dr. Penne Croak  Otolaryngology Clinic Note Referring provider: Dr. Norleen HPI:  Discussed the use of AI scribe software for clinical note transcription with the patient, who gave verbal consent to proceed.  History of Present Illness Sue Beasley is a 53 year old female who presents with intermittent nasal pressure. She was referred by Dr. Norleen for evaluation of nasal pressure.  Nasal pressure - Intermittent pressure localized to the nasal area - Occurs approximately three to four times per week - Each episode lasts about one minute - No associated pain - No identifiable triggers or alleviating factors - Symptoms occur randomly  Allergic rhinitis - History of allergies - Currently receiving allergy immunotherapy (allergy shots) - Recent change in allergy serum following retesting in December - Uses a nasal spray for allergy management  Oncologic history and treatment - History of cancer, currently taking tamoxifen  - Completed radiation therapy in November of the previous year - Concern regarding possible relationship between nasal symptoms and cancer treatment or medication  Trauma history - History of prior fall with impact to the jaw  Psychological stress - Experiences job-related stress - Currently attending counseling for stress management  Hearing status - No recent hearing test - Curious about current hearing status   Independent Review of Additional Tests or Records:  Reviewed external note from referring PCP, Sue Beasley,Sue Beasley relevant history incorporated into today's evaluation.   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 Lynwood Nasuti   Irreducible umbilical hernia 03/01/2011   Personal history of radiation therapy    Wears glasses      Past Surgical History:  Procedure Laterality Date   BREAST BIOPSY Left 01/14/2023   US  LT BREAST BX W LOC DEV 1ST LESION IMG BX SPEC US  GUIDE 01/14/2023 [facility]   BREAST BIOPSY  02/10/2023   MM LT RADIOACTIVE SEED LOC MAMMO GUIDE 02/10/2023 [facility]   BREAST LUMPECTOMY WITH RADIOACTIVE SEED AND SENTINEL LYMPH NODE BIOPSY Left 02/14/2023   Procedure: LEFT BREAST LUMPECTOMY WITH RADIOACTIVE SEED AND AXILLARY SENTINEL LYMPH NODE BIOPSY;  Surgeon: Ebbie Cough, MD;  Location: [facility];  Service: General;  Laterality: Left;   HERNIA REPAIR  03/2011   PORT-A-CATH REMOVAL N/A 05/31/2023   Procedure: REMOVAL PORT-A-CATH;  Surgeon: Ebbie Cough, MD;  Location: [facility];  Service: General;  Laterality: N/A;   PORTACATH PLACEMENT Right 03/15/2023   Procedure: INSERTION PORT-A-CATH;  Surgeon: Ebbie Cough, MD;  Location: [facility] OR;  Service: General;  Laterality: Right;   UMBILICAL HERNIA REPAIR  05/13/2011    Family History  Problem Relation Age of Onset   Hyperlipidemia Mother    Heart disease Mother    Diabetes Father    Hypertension Father    Heart disease Father    Colon polyps  Sister    Miscarriages / Stillbirths Sister    Esophageal cancer Maternal Uncle 89   Pancreatic cancer Cousin 56       mat female cousin   Prostate cancer Cousin 13       mat cousin   Breast cancer Neg Hx    Colon cancer Neg Hx    Stomach cancer Neg Hx    Rectal cancer Neg Hx       Social Connections: Moderately Integrated (07/11/2024)   Social Connection and Isolation Panel    Frequency of Communication with Friends and Family: More than three times a week    Frequency of Social Gatherings with Friends and Family: More than three times a week    Attends Religious Services: More than 4 times per year    Active Member of [employer] or Organizations: Yes    Attends Engineer, Structural: More than 4 times per year    Marital Status: Never married      Current Outpatient Medications:    albuterol (VENTOLIN HFA) 108 (90 Base) MCG/ACT inhaler, Inhale 2 puffs into the lungs every 6 (six) hours as needed for shortness of breath or wheezing., Disp: , Rfl:    azelastine (ASTELIN) 0.1 % nasal spray, Place 1 spray into both nostrils in the morning., Disp: , Rfl: 5   desoximetasone (TOPICORT) 0.25 % cream, Apply 1 Application topically 2 (two) times daily as needed (eczema)., Disp: , Rfl:    diphenhydrAMINE (BENADRYL) 25 MG tablet, Take 25 mg by mouth daily as needed (food allergies (with restaurant eating))., Disp: , Rfl:    dorzolamide-timolol (COSOPT) 22.3-6.8 MG/ML ophthalmic solution, Place 1 drop into both eyes 2 (two) times daily., Disp: , Rfl:    EPINEPHrine  0.3 mg/0.3 mL IJ SOAJ injection, Inject 0.3 mg into the muscle as needed for anaphylaxis., Disp: , Rfl:    ferrous sulfate 325 (65 FE) MG tablet, Take 325 mg by mouth 3 (three) times a week., Disp: , Rfl:    fexofenadine (ALLEGRA ODT) 30 MG disintegrating tablet, Take 30 mg by mouth daily., Disp: , Rfl:    fluticasone (FLONASE) 50 MCG/ACT nasal spray, Place 2 sprays into both nostrils in the morning., Disp: , Rfl: 4   fluticasone-salmeterol (WIXELA INHUB) 250-50 MCG/ACT AEPB, Inhale 1 puff into the lungs in the morning., Disp: , Rfl:    ketotifen (ZADITOR) 0.035 % ophthalmic solution, Place 1-2 drops into both eyes 2 (two) times daily as needed (allergies.)., Disp: , Rfl:    latanoprost (XALATAN) 0.005 % ophthalmic  solution, Place 1 drop into both eyes at bedtime., Disp: , Rfl:    methylPREDNISolone  (MEDROL  DOSEPAK) 4 MG TBPK tablet, Take 4 tab by mouth x 3 days, 2 tabs x 3 days, then 1 tab x 3 days, Disp: 21 tablet, Rfl: 0   tamoxifen  (NOLVADEX ) 20 MG tablet, Take 1 tablet (20 mg total) by mouth daily., Disp: 90 tablet, Rfl: 3   Physical Exam:   BP 139/79   Pulse (!) 57   Temp 98.2 F (36.8 C)   Ht 5' 1 (1.549 m)   Wt 213 lb (96.6 kg)   SpO2 96%   BMI 40.25 kg/m   The patient was awake, alert, and appropriate. The external ears were inspected, and otoscopy was performed to evaluate the external auditory canals and tympanic membranes. The nasal cavity and septum were examined for mucosal changes, obstruction, or discharge. The oral cavity and oropharynx were inspected for mucosal lesions, infection, or tonsillar hypertrophy.  The neck was palpated for lymphadenopathy, thyroid  abnormalities, or other masses. Cranial nerve function was grossly intact.  Pertinent Findings: Physical Exam HEENT: Eardrums normal. Deviated septum present. Nasal drainage present. Mild chronic sinusitis. Normal oropharynx. Moist mucous membranes. Inferior turbinate hypertrophy  Tenderness to palpation left TMJ  Seprately Identifiable Procedures:  I personally ordered, reviewed and interpreted the following with the patient today  Given the patient's symptoms and incomplete visualization of critical sinonasal areas with anterior rhinoscopy, a separately performed diagnostic nasal endoscopy procedure is indicated for a complete rhinologic evaluation per American Rhinologic Society recommendations ([email or web address]://[email or web address])  I personally ordered, reviewed and interpreted the following with the patient today  Procedure Note Diagnostic Nasal Endoscopy CPT CODE -- 68768 - Mod 25  Prior to initiating any procedures, risks/benefits/alternatives were explained to the patient and verbal consent  obtained.  Pre-procedure diagnosis: Concern for mass/obstruction Post-procedure diagnosis: same Indication: See pre-procedure diagnosis and physical exam above Complications: None apparent EBL: 0 mL Anesthesia: Lidocaine  4% and topical decongestant was topically sprayed in each nasal cavity  Description of Procedure:  Patient was identified. A flexible fiberoptic endoscope was utilized to evaluate the sinonasal cavities, mucosa, sinus ostia and turbinates and septum.  Overall, signs of mucosal inflammation are noted.  Also noted are crusting anterior aspect of bilateral inferior turbinates.  No mucopurulence, polyps, or masses noted.   Right Middle meatus: congested Right SE Recess: clear Left MM: congested Left SE Recess: clear Photodocumentation was obtained.   Impression & Plans:  Sue Beasley is a 53 y.o. female  1. Referred otalgia of left ear   2. TMJ pain dysfunction syndrome   3. Chronic maxillary sinusitis   4. DNS (deviated nasal septum)   5. Nasal turbinate hypertrophy   6. Concha bullosa     - Findings and diagnoses discussed in detail with the patient. - Risks, benefits, and alternatives were reviewed. Through shared decision making, the patient elects to proceed with below.  Assessment & Plan Temporomandibular joint (TMJ) disorder Intermittent jaw pressure likely due to stress and possible bruxism. CT scan normal. Differential includes stress-related muscle tension and bruxism. - Recommended over-the-counter bite guard for nighttime use. - Advised to avoid chewing gum and opt for softer foods. - Suggested ibuprofen for symptomatic relief during episodes. - Continue counseling for stress management.  Chronic sinusitis and allergic rhinitis Mild chronic sinusitis likely allergy-triggered. CT shows mild sinus issues, primarily right-sided. Nasal endoscopy shows deviated septum without significant obstruction. Current nasal spray effective. - Continue current allergy  management with nasal spray. - Scheduled a hearing test to assess auditory function.  Deviated nasal septum Noted on CT and endoscopy. No significant obstruction or breathing difficulties. Current allergy management maintains nasal patency. - Continue current allergy management with nasal spray.  - Orders placed: No orders of the defined types were placed in this encounter.  - Medications prescribed/continued/adjusted: No orders of the defined types were placed in this encounter.  - Education materials provided to the patient. - Follow up: with audio, recheck sinus sxs and TMJD. Patient instructed to return sooner or go to the ED if new/worsening symptoms develop.   Thank you for allowing me the opportunity to care for your patient. Please do not hesitate to contact me should you have any other questions.  Sincerely, Penne Croak, DO Otolaryngologist (ENT) [facility] Phone: [phone] Fax: [phone]  08/27/2024, 2:14 PM

## 2024-08-31 ENCOUNTER — Ambulatory Visit: Attending: Hematology and Oncology

## 2024-08-31 DIAGNOSIS — I890 Lymphedema, not elsewhere classified: Secondary | ICD-10-CM | POA: Insufficient documentation

## 2024-08-31 DIAGNOSIS — Z9189 Other specified personal risk factors, not elsewhere classified: Secondary | ICD-10-CM | POA: Insufficient documentation

## 2024-08-31 DIAGNOSIS — Z170 Estrogen receptor positive status [ER+]: Secondary | ICD-10-CM | POA: Insufficient documentation

## 2024-08-31 DIAGNOSIS — Z483 Aftercare following surgery for neoplasm: Secondary | ICD-10-CM | POA: Diagnosis present

## 2024-08-31 DIAGNOSIS — C50412 Malignant neoplasm of upper-outer quadrant of left female breast: Secondary | ICD-10-CM | POA: Insufficient documentation

## 2024-08-31 DIAGNOSIS — R293 Abnormal posture: Secondary | ICD-10-CM | POA: Insufficient documentation

## 2024-08-31 NOTE — Therapy (Signed)
 OUTPATIENT PHYSICAL THERAPY  UPPER EXTREMITY ONCOLOGY TREATMENT   Patient Name: Sue Beasley MRN: [id number] DOB:September 13, 1971, 53 y.o., female Today's Date: 08/31/2024  END OF SESSION:  PT End of Session - 08/31/24 0806     Visit Number 3    Number of Visits 4    Date for Recertification  09/06/24    Authorization Type no auth    PT Start Time 0800    PT Stop Time 0858    PT Time Calculation (min) 58 min    Activity Tolerance Patient tolerated treatment well    Behavior During Therapy [facility] for tasks assessed/performed           Past Medical History:  Diagnosis Date   ALLERGIC RHINITIS 11/06/2009   Allergy    Anemia    ANEMIA-IRON DEFICIENCY 11/20/2009   Asthma    Cancer (HCC) 12/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 Lynwood Nasuti   Irreducible umbilical hernia 03/01/2011   Personal history of radiation therapy    Wears glasses    Past Surgical History:  Procedure Laterality Date   BREAST BIOPSY Left 01/14/2023   US  LT BREAST BX W LOC DEV 1ST LESION IMG BX SPEC US  GUIDE 01/14/2023 [facility]   BREAST BIOPSY  02/10/2023   MM LT RADIOACTIVE SEED LOC MAMMO GUIDE 02/10/2023 [facility]   BREAST LUMPECTOMY WITH RADIOACTIVE SEED AND SENTINEL LYMPH NODE BIOPSY Left 02/14/2023   Procedure: LEFT BREAST LUMPECTOMY WITH RADIOACTIVE SEED AND AXILLARY SENTINEL LYMPH NODE BIOPSY;  Surgeon: Ebbie Cough, MD;  Location: [facility];  Service: General;  Laterality: Left;   HERNIA REPAIR  03/2011   PORT-A-CATH REMOVAL N/A 05/31/2023   Procedure: REMOVAL PORT-A-CATH;  Surgeon: Ebbie Cough, MD;  Location: [facility];  Service: General;  Laterality: N/A;   PORTACATH PLACEMENT Right 03/15/2023   Procedure: INSERTION PORT-A-CATH;  Surgeon: Ebbie Cough, MD;  Location: [facility] OR;  Service: General;  Laterality: Right;   UMBILICAL  HERNIA REPAIR  05/13/2011   Patient Active Problem List   Diagnosis Date Noted   Pain of maxillary sinus 07/12/2024   B12 deficiency 11/29/2023   Port-A-Cath in place 03/23/2023   Genetic testing 02/04/2023   Malignant neoplasm of upper-outer quadrant of left breast in female, estrogen receptor positive (HCC) 01/24/2023   Low vitamin D  level 11/19/2021   Incontinence 12/19/2020   Low mean corpuscular volume (MCV) 11/23/2020   Pain and swelling of wrist, left 03/14/2020   Hyperglycemia 11/09/2019   Uterine leiomyoma 11/09/2019   Acute upper respiratory infection 02/12/2019   Increased body mass index 11/02/2018   Obesity 08/02/2014   Heart murmur, systolic 08/01/2014   Encounter for well adult exam with abnormal findings 03/01/2011   Hyperlipidemia 11/20/2009   Iron deficiency anemia 11/20/2009   Allergic rhinitis 11/06/2009   Asthma 11/06/2009    PCP: Norleen Lynwood ORN., MD  REFERRING PROVIDER: Odean Potts, MD   REFERRING DIAG:  [phone] (ICD-10-CM) - Malignant neoplasm of upper-outer quadrant of left breast in female, estrogen receptor positive (HCC)    THERAPY DIAG:  Malignant neoplasm of upper-outer quadrant of left breast in female, estrogen receptor positive (HCC)  Abnormal posture  At risk for lymphedema  Aftercare following surgery for neoplasm  Lymphedema, not elsewhere classified  ONSET DATE: 08/13/24  Rationale for Evaluation and Treatment: Rehabilitation  SUBJECTIVE:  SUBJECTIVE STATEMENT:  I've been doing the stretches but I think I need to adjust them to feel a better stretch. I also have been continuing to breast MLD. I saw Dr. Ebbie yesterday and he said I'm doing good and also encouraged me to keep stretching. I think the cord is slowly getting better.  Sometimes I think I feel it into the side of my breast too.   Eval: Patient reports she has noticed some swelling near the elbow and tightness in the forearm. The swelling has improved since the beginning of the week when it began but is still noticeable. The patient is left handed so she does use that arm for most ADL's. She has been doing strength training twice a week and increased her weight on the Monday which could have contributed to the swelling. The patient also attended homecoming for [employer] a few weeks ago where she did a lot of walking.   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 It measures 1.1 cm and is located in the upper outer quadrant. It is ER/PR positive HER2 negative with a Ki67 of 70%. left lumpectomy and SLNB on 02/14/23 with completion of chemotherapy with TC and radiation. 8 negative nodes removed. 1 aspiration in the axilla of 75ml   PAIN:  Are you having pain? No - feels tightness in the forearm and elbow  PRECAUTIONS: None  RED FLAGS: None   WEIGHT BEARING RESTRICTIONS: No  FALLS:  Has patient fallen in last 6 months? 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  COGNITION: Overall cognitive status: Within functional limits for tasks assessed   PALPATION:Cording at the axilla  OBSERVATIONS / OTHER ASSESSMENTS: Increased edema observed at the Lt elbow and slightly above  UPPER EXTREMITY AROM/PROM:  A/PROM RIGHT   eval   Shoulder extension WFL  Shoulder flexion [facility]  Shoulder abduction [facility]  Shoulder internal rotation [facility]  Shoulder external rotation WFL - tightness in elbow    (Blank rows = not tested)  A/PROM LEFT   eval   Shoulder extension WFL  Shoulder flexion WFL  Shoulder abduction [facility]  Shoulder internal rotation [facility]  Shoulder external rotation WFL - tightness in elbow    (Blank rows = not tested)   UPPER EXTREMITY STRENGTH: WFL   LYMPHEDEMA ASSESSMENTS:   LANDMARK RIGHT  eval  At axilla    15 cm proximal to the proximal aspect of the olecranon process   10 cm proximal to the proximal aspect of the olecranon process   Olecranon process   15 cm proximal to the proximal aspect of the ulnar styloid process   10 cm proximal to the proximal aspect of the ulnar styloid process   Just distal to the ulnar styloid process   Across hand at thumb web space   At base of 2nd digit   (Blank rows = not tested)  LANDMARK LEFT  eval  At axilla  41.3  15 cm proximal to the proximal aspect of the olecranon process   10 cm proximal to the proximal aspect of the olecranon process   Olecranon process 27.7  15 cm proximal to the proximal aspect of the ulnar styloid process   10 cm proximal  to  the proximal aspect of the ulnar styloid process   Just distal to the ulnar styloid process 15.8  Across hand at thumb web space   At base of 2nd digit   (Blank rows = not tested)  Chest circumference just inferior to the axillae:  Chest circumference at the largest point:    L-DEX LYMPHEDEMA SCREENING: The patient was assessed using the L-Dex machine today to produce a lymphedema index baseline score. The patient will be reassessed on a regular basis (typically every 3 months) to obtain new L-Dex scores. If the score is > 6.5 points away from his/her baseline score indicating onset of subclinical lymphedema, it will be recommended to wear a compression garment for 4 weeks, 12 hours per day and then be reassessed. If the score continues to be > 6.5 points from baseline at reassessment, we will initiate lymphedema treatment. Assessing in this manner has a 95% rate of preventing clinically significant lymphedema.                                                                                                                           TREATMENT DATE:  08/31/24: Therapeutic Exercises Roll yellow ball up wall for flex and Lt UE abd x 10 each, pt returning therapist demo Modified downward dog on wall x 5 reps, 5 sec holds returning therapist demo Standing pec stretch with wrist ext at doorframe 3 x 20 seconds Manual Therapy With pt permission for arm MLD STM for cording at the antecubital fossa and axilla, then into Rt S/L for focus to lateral breast/trunk where palpable tightness and lymphedema present. Educated pt tightness and fluid here and that it would be beneficial for pt to perform her self MLD in S/L some to be able to focus on lateral area while performing and had pt return brief demo. This did soften well by end of session.  In supine: Short neck, 5 diaphragmatic breaths, R axillary nodes and establishment of interaxillary pathway, L inguinal nodes and establishment of axilloinguinal pathway, then L UE working proximal to distal, moving fluid from upper inner arm outwards, and doing both sides of forearm moving fluid towards pathways spending extra time in any areas of fibrosis then retracing all steps   08/24/24 With pt permission for arm MLD Therapeutic Exercise: Standing pec stretch at wall 3 x 30 seconds Standing wrist extension stretch 3 x 30 seconds Manual Therapy: STM for cording at the antecubital fossa, forearm, and axilla  In supine: Short neck, 5 diaphragmatic breaths, R axillary nodes and establishment of interaxillary pathway, L inguinal nodes and establishment of axilloinguinal pathway, then L UE working proximal to distal, moving fluid from upper inner arm outwards, and doing both sides of forearm moving fluid towards pathways spending extra time in any areas of fibrosis then retracing all steps      08/16/24 Completed initial evaluation Pt education on ordering a compression sleeve,  wearing it when she is weight training or will  be increasing her activity Demonstrated and explained how to perform MLD for the Lt arm STM for cording at the antecubital fossa and forearm Doorway stretch 2 x 30 seconds  Standing pec stretch at wall x 30 seconds Standing wrist extension stretch x 30 seconds  PATIENT EDUCATION:  Education details: POC, compression sleeve, MLD for breast and Lt arm, HEP Person educated: Patient Education method: Explanation, Demonstration, and Handouts Education comprehension: verbalized understanding and returned demonstration  HOME EXERCISE PROGRAM: Access Code: [email or web address] URL: [email or web address] Date: 08/16/2024 Prepared by: Saddie Raw  Exercises - Single Arm Doorway Pec Stretch at 90 Degrees Abduction - 2 x daily - 7 x weekly - 3 reps - 20-30 second hold - Standing Pec Stretch at Wall - 2 x daily - 7 x weekly - 3 reps - 20-30 seconds hold - Standing Wrist Extension Stretch - 2 x daily - 7 x weekly - 3 reps - 20-30 seconds hold   ASSESSMENT:  CLINICAL IMPRESSION: Continued with AA/A/ROM stretches to focus on fascial restrictions from cording. Then continued with manual therapy to reduce lymphatic fluid, especially at medial elbow that has improved. Also to reduce tightness from cording that was less palpable today. Tightness was palpable where pt reports tenderness at lateral breast/trunk. Educated pt how to work on this at home with her manual therapy. Also educated her that this area of contention is due partially to poor posture when at work and demonstrated how her posture can affect this area. Pt able to verbalize good understanding.   OBJECTIVE IMPAIRMENTS: decreased knowledge of condition, increased edema, and increased fascial restrictions.   ACTIVITY LIMITATIONS: exercise including strength training  PARTICIPATION LIMITATIONS: community activity  PERSONAL FACTORS: 1 comorbidity: radiation history, SNLB are also affecting  patient's functional outcome.   REHAB POTENTIAL: Excellent  CLINICAL DECISION MAKING: Stable/uncomplicated  EVALUATION COMPLEXITY: Low  GOALS: Goals reviewed with patient? Yes  GOALS: Short term = long term goals Target date: 09/06/24  Patient will obtain a compression sleeve to reduce the risk of lymphedema when increasing activity or exercising  Baseline: Goal status: INITIAL  2.  Patient will be independent with Lt arm MLD to reduce swelling at the Lt elbow Baseline:  Goal status: INITIAL  3.  Patient will reduce cording in the Lt elbow by 50% in order to reduce the tightness and pulling felt with functional movements  Baseline:  Goal status: INITIAL  PLAN:  PT FREQUENCY: 1x/week  PT DURATION: 3 weeks  PLANNED INTERVENTIONS: 97164- PT Re-evaluation, 97110-Therapeutic exercises, 97530- Therapeutic activity, 97140- Manual therapy, Patient/Family education, Balance training, Joint mobilization, Manual lymph drainage, Scar mobilization, Compression bandaging, DME instructions, Therapeutic exercises, Therapeutic activity, Neuromuscular re-education, Gait training, and Self Care  PLAN FOR NEXT SESSION: Arm MLD, stretching, STM/IASTM for cording   Berwyn Knights, PTA 08/31/24 10:43 AM

## 2024-09-07 ENCOUNTER — Encounter: Payer: Self-pay | Admitting: Rehabilitation

## 2024-09-07 ENCOUNTER — Ambulatory Visit: Attending: Hematology and Oncology | Admitting: Rehabilitation

## 2024-09-07 DIAGNOSIS — Z9189 Other specified personal risk factors, not elsewhere classified: Secondary | ICD-10-CM | POA: Insufficient documentation

## 2024-09-07 DIAGNOSIS — Z170 Estrogen receptor positive status [ER+]: Secondary | ICD-10-CM | POA: Diagnosis present

## 2024-09-07 DIAGNOSIS — R293 Abnormal posture: Secondary | ICD-10-CM | POA: Diagnosis present

## 2024-09-07 DIAGNOSIS — C50412 Malignant neoplasm of upper-outer quadrant of left female breast: Secondary | ICD-10-CM | POA: Insufficient documentation

## 2024-09-07 DIAGNOSIS — Z483 Aftercare following surgery for neoplasm: Secondary | ICD-10-CM | POA: Diagnosis present

## 2024-09-07 DIAGNOSIS — I890 Lymphedema, not elsewhere classified: Secondary | ICD-10-CM | POA: Diagnosis present

## 2024-09-07 NOTE — Therapy (Signed)
 OUTPATIENT PHYSICAL THERAPY  UPPER EXTREMITY ONCOLOGY TREATMENT   Patient Name: Sue Beasley MRN: [id number] DOB:1971/01/06, 53 y.o., female Today's Date: 09/07/2024  END OF SESSION:  PT End of Session - 09/07/24 0900     Visit Number 4    Number of Visits 6    Date for Recertification  09/21/24    Authorization Type no auth    PT Start Time 0801    PT Stop Time 0900    PT Time Calculation (min) 59 min    Activity Tolerance Patient tolerated treatment well    Behavior During Therapy [facility] for tasks assessed/performed            Past Medical History:  Diagnosis Date   ALLERGIC RHINITIS 11/06/2009   Allergy    Anemia    ANEMIA-IRON DEFICIENCY 11/20/2009   Asthma    Cancer (HCC) 12/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 Lynwood Nasuti   Irreducible umbilical hernia 03/01/2011   Personal history of radiation therapy    Wears glasses    Past Surgical History:  Procedure Laterality Date   BREAST BIOPSY Left 01/14/2023   US  LT BREAST BX W LOC DEV 1ST LESION IMG BX SPEC US  GUIDE 01/14/2023 [facility]   BREAST BIOPSY  02/10/2023   MM LT RADIOACTIVE SEED LOC MAMMO GUIDE 02/10/2023 [facility]   BREAST LUMPECTOMY WITH RADIOACTIVE SEED AND SENTINEL LYMPH NODE BIOPSY Left 02/14/2023   Procedure: LEFT BREAST LUMPECTOMY WITH RADIOACTIVE SEED AND AXILLARY SENTINEL LYMPH NODE BIOPSY;  Surgeon: Ebbie Cough, MD;  Location: [facility];  Service: General;  Laterality: Left;   HERNIA REPAIR  03/2011   PORT-A-CATH REMOVAL N/A 05/31/2023   Procedure: REMOVAL PORT-A-CATH;  Surgeon: Ebbie Cough, MD;  Location: [facility];  Service: General;  Laterality: N/A;   PORTACATH PLACEMENT Right 03/15/2023   Procedure: INSERTION PORT-A-CATH;  Surgeon: Ebbie Cough, MD;  Location: [facility];  Service: General;  Laterality: Right;    UMBILICAL HERNIA REPAIR  05/13/2011   Patient Active Problem List   Diagnosis Date Noted   Pain of maxillary sinus 07/12/2024   B12 deficiency 11/29/2023   Port-A-Cath in place 03/23/2023   Genetic testing 02/04/2023   Malignant neoplasm of upper-outer quadrant of left breast in female, estrogen receptor positive (HCC) 01/24/2023   Low vitamin D  level 11/19/2021   Incontinence 12/19/2020   Low mean corpuscular volume (MCV) 11/23/2020   Pain and swelling of wrist, left 03/14/2020   Hyperglycemia 11/09/2019   Uterine leiomyoma 11/09/2019   Acute upper respiratory infection 02/12/2019   Increased body mass index 11/02/2018   Obesity 08/02/2014   Heart murmur, systolic 08/01/2014   Encounter for well adult exam with abnormal findings 03/01/2011   Hyperlipidemia 11/20/2009   Iron deficiency anemia 11/20/2009   Allergic rhinitis 11/06/2009   Asthma 11/06/2009    PCP: Norleen Lynwood ORN., MD  REFERRING PROVIDER: Odean Potts, MD   REFERRING DIAG:  [phone] (ICD-10-CM) - Malignant neoplasm of upper-outer quadrant of left breast in female, estrogen receptor positive (HCC)    THERAPY DIAG:  Malignant neoplasm of upper-outer quadrant of left breast in female, estrogen receptor positive (HCC)  Abnormal posture  At risk for lymphedema  Aftercare following surgery for neoplasm  Lymphedema, not elsewhere classified  ONSET DATE: 08/13/24  Rationale for Evaluation and Treatment: Rehabilitation  SUBJECTIVE:  SUBJECTIVE STATEMENT:  Patient reports that both the swelling and tightness have improved but she still does feel some tightness. She forgot to do her stretching one morning before work and could notice a big difference during the day.   Eval: Patient reports she has noticed some swelling near  the elbow and tightness in the forearm. The swelling has improved since the beginning of the week when it began but is still noticeable. The patient is left handed so she does use that arm for most ADL's. She has been doing strength training twice a week and increased her weight on the Monday which could have contributed to the swelling. The patient also attended homecoming for [employer] a few weeks ago where she did a lot of walking.   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 It measures 1.1 cm and is located in the upper outer quadrant. It is ER/PR positive HER2 negative with a Ki67 of 70%. left lumpectomy and SLNB on 02/14/23 with completion of chemotherapy with TC and radiation. 8 negative nodes removed. 1 aspiration in the axilla of 75ml   PAIN:  Are you having pain? No - feels tightness in the forearm and elbow  PRECAUTIONS: None  RED FLAGS: None   WEIGHT BEARING RESTRICTIONS: No  FALLS:  Has patient fallen in last 6 months? 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  COGNITION: Overall cognitive status: Within functional limits for tasks assessed   PALPATION:Cording at the axilla  OBSERVATIONS / OTHER ASSESSMENTS: Increased edema observed at the Lt elbow and slightly above  UPPER EXTREMITY AROM/PROM:  A/PROM RIGHT   eval   Shoulder extension WFL  Shoulder flexion [facility]  Shoulder abduction [facility]  Shoulder internal rotation [facility]  Shoulder external rotation WFL - tightness in elbow    (Blank rows = not tested)  A/PROM LEFT   eval  Shoulder extension WFL  Shoulder flexion WFL  Shoulder abduction [facility]  Shoulder internal rotation [facility]  Shoulder external  rotation WFL - tightness in elbow    (Blank rows = not tested)   UPPER EXTREMITY STRENGTH: WFL   LYMPHEDEMA ASSESSMENTS:   LANDMARK RIGHT  eval  At axilla    15 cm proximal to the proximal aspect of the olecranon process   10 cm proximal to the proximal aspect of the olecranon process   Olecranon process   15 cm proximal to the proximal aspect of the ulnar styloid process   10 cm proximal to the proximal aspect of the ulnar styloid process   Just distal to the ulnar styloid process   Across hand at thumb web space   At base of 2nd digit   (Blank rows = not tested)  LANDMARK LEFT  eval   At axilla  41.3   15 cm proximal to the proximal aspect of the olecranon process    10 cm proximal to the proximal aspect of the olecranon process    Olecranon process 27.7   15 cm proximal to the proximal aspect of the ulnar styloid process    10 cm proximal to  the proximal aspect of the ulnar styloid process    Just distal to the ulnar styloid process 15.8  Across hand at thumb web space    At base of 2nd digit    (Blank rows = not tested)  Chest circumference just inferior to the axillae:  Chest circumference at the largest point:    L-DEX LYMPHEDEMA SCREENING: The patient was assessed using the L-Dex machine today to produce a lymphedema index baseline score. The patient will be reassessed on a regular basis (typically every 3 months) to obtain new L-Dex scores. If the score is > 6.5 points away from his/her baseline score indicating onset of subclinical lymphedema, it will be recommended to wear a compression garment for 4 weeks, 12 hours per day and then be reassessed. If the score continues to be > 6.5 points from baseline at reassessment, we will initiate lymphedema treatment. Assessing in this manner has a 95% rate of preventing clinically significant lymphedema.                                                                                                                           TREATMENT DATE:  09/07/24: Therapeutic Exercises Roll yellow ball up wall for flex and Lt UE abd x 10 each Modified downward dog on wall x 5 reps, 5 sec holds Standing pec stretch at doorframe 3 x 20 seconds Manual Therapy With pt permission for arm MLD STM for cording at the antecubital fossa and axilla, then into Rt S/L for focus to lateral breast/trunk where palpable tightness and lymphedema present. Educated pt tightness and fluid here and that it would be beneficial for pt to perform her self MLD in S/L some to be able to focus on lateral area while performing and had pt return brief demo. This did soften well by end of session.  In supine: Short neck, 5 diaphragmatic breaths, R axillary nodes and establishment of interaxillary pathway, L inguinal nodes and establishment of axilloinguinal pathway, then L UE working proximal to distal, moving fluid from upper inner arm outwards, and doing both sides of forearm moving fluid towards pathways spending extra time in any areas of fibrosis then retracing all steps  08/31/24: Therapeutic Exercises Roll yellow ball up wall for flex and Lt UE abd x 10 each, pt returning therapist demo Modified downward dog on wall x 5 reps, 5 sec holds returning therapist demo Standing pec stretch with wrist ext at doorframe 3 x 20 seconds Manual Therapy With pt permission for arm MLD STM for cording at the antecubital fossa and axilla, then into Rt S/L for focus to lateral breast/trunk where palpable tightness and lymphedema present. Educated pt tightness and fluid here and that it would be beneficial for pt to perform her self MLD in S/L some to be able to focus on lateral area while performing and had pt return brief demo. This did soften well by end of session.  In supine: Short neck, 5 diaphragmatic breaths, R axillary nodes and establishment of interaxillary pathway, L inguinal nodes and establishment of axilloinguinal pathway, then L UE working proximal  to  distal, moving fluid from upper inner arm outwards, and doing both sides of forearm moving fluid towards pathways spending extra time in any areas of fibrosis then retracing all steps   08/24/24 With pt permission for arm MLD Therapeutic Exercise: Standing pec stretch at wall 3 x 30 seconds Standing wrist extension stretch 3 x 30 seconds Manual Therapy: STM for cording at the antecubital fossa, forearm, and axilla  In supine: Short neck, 5 diaphragmatic breaths, R axillary nodes and establishment of interaxillary pathway, L inguinal nodes and establishment of axilloinguinal pathway, then L UE working proximal to distal, moving fluid from upper inner arm outwards, and doing both sides of forearm moving fluid towards pathways spending extra time in any areas of fibrosis then retracing all steps      08/16/24 Completed initial evaluation Pt education on ordering a compression sleeve, wearing it when she is weight training or will be increasing her activity Demonstrated and explained how to perform MLD for the Lt arm STM for cording at the antecubital fossa and forearm Doorway stretch 2 x 30 seconds  Standing pec stretch at wall x 30 seconds Standing wrist extension stretch x 30 seconds  PATIENT EDUCATION:  Education details: POC, compression sleeve, MLD for breast and Lt arm, HEP Person educated: Patient Education method: Explanation, Demonstration, and Handouts Education comprehension: verbalized understanding and returned demonstration  HOME EXERCISE PROGRAM: Access Code: [email or web address] URL: [email or web address] Date: 08/16/2024 Prepared by: Saddie Raw  Exercises - Single Arm Doorway Pec Stretch at 90 Degrees Abduction - 2 x daily - 7 x weekly - 3 reps - 20-30 second hold - Standing Pec Stretch at Wall - 2 x daily - 7 x weekly - 3 reps - 20-30 seconds hold - Standing Wrist Extension Stretch - 2 x daily - 7 x weekly - 3 reps - 20-30 seconds hold   ASSESSMENT:  CLINICAL  IMPRESSION: Patient has made progress towards her goals however has not fully met them. While the patient has decreased cording at the antecubital fossa, mild cording is still palpable. The patient reports that she has noticed that her HEP has been beneficial and assisted in decreasing fascial tightness. The patient tolerates the continuation of MLD and STM for UE edema and cording. The patient would benefit from continued skilled physical therapy to reduce cording at the antecubital fossa, improve edema, and reduce fascial restrictions.    OBJECTIVE IMPAIRMENTS: decreased knowledge of condition, increased edema, and increased fascial restrictions.   ACTIVITY LIMITATIONS: exercise including strength training  PARTICIPATION LIMITATIONS: community activity  PERSONAL FACTORS: 1 comorbidity: radiation history, SNLB are also affecting patient's functional outcome.   REHAB POTENTIAL: Excellent  CLINICAL DECISION MAKING: Stable/uncomplicated  EVALUATION COMPLEXITY: Low  GOALS: Goals reviewed with patient? Yes  GOALS: Short term = long term goals Target date: 09/06/24  Patient will obtain a compression sleeve to reduce the risk of lymphedema when increasing activity or exercising  Baseline: Goal status: Ongoing  2.  Patient will be independent with Lt arm MLD to reduce swelling at the Lt elbow Baseline:  Goal status: Ongoing  3.  Patient will reduce cording in the Lt elbow by 50% in order to reduce the tightness and pulling felt with functional movements  Baseline:  Goal status: Ongoing  PLAN:  PT FREQUENCY: 1x/week  PT DURATION: 2 weeks  PLANNED INTERVENTIONS: 97164- PT Re-evaluation, 97110-Therapeutic exercises, 97530- Therapeutic activity, 97140- Manual therapy, Patient/Family education, Balance training, Joint mobilization, Manual lymph drainage, Scar mobilization, Compression bandaging, DME instructions, Therapeutic  exercises, Therapeutic activity, Neuromuscular re-education,  Gait training, and Self Care  PLAN FOR NEXT SESSION: Arm MLD, stretching, STM/IASTM for cording   Randall Pack, SPT  09/07/24 9:52 AM  I agree with the following treatment note after reviewing documentation. This session was performed under the supervision of a licensed clinician.  Saddie Raw, PT 09/07/24, 10:28 AM

## 2024-09-12 ENCOUNTER — Ambulatory Visit: Admitting: Rehabilitation

## 2024-09-12 DIAGNOSIS — C50412 Malignant neoplasm of upper-outer quadrant of left female breast: Secondary | ICD-10-CM

## 2024-09-12 DIAGNOSIS — R293 Abnormal posture: Secondary | ICD-10-CM

## 2024-09-12 DIAGNOSIS — Z9189 Other specified personal risk factors, not elsewhere classified: Secondary | ICD-10-CM

## 2024-09-12 DIAGNOSIS — I890 Lymphedema, not elsewhere classified: Secondary | ICD-10-CM

## 2024-09-12 DIAGNOSIS — Z483 Aftercare following surgery for neoplasm: Secondary | ICD-10-CM

## 2024-09-12 NOTE — Therapy (Signed)
 OUTPATIENT PHYSICAL THERAPY  UPPER EXTREMITY ONCOLOGY TREATMENT   Patient Name: Sue Beasley MRN: [id number] DOB:Mar 05, 1971, 53 y.o., female Today's Date: 09/12/2024  END OF SESSION:  PT End of Session - 09/12/24 0758     Visit Number 5    Number of Visits 6    Date for Recertification  09/21/24    Authorization Type no auth    PT Start Time 0800    PT Stop Time 0854    PT Time Calculation (min) 54 min    Activity Tolerance Patient tolerated treatment well    Behavior During Therapy [facility] for tasks assessed/performed             Past Medical History:  Diagnosis Date   ALLERGIC RHINITIS 11/06/2009   Allergy    Anemia    ANEMIA-IRON DEFICIENCY 11/20/2009   Asthma    Cancer (HCC) 12/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 Lynwood Nasuti   Irreducible umbilical hernia 03/01/2011   Personal history of radiation therapy    Wears glasses    Past Surgical History:  Procedure Laterality Date   BREAST BIOPSY Left 01/14/2023   US  LT BREAST BX W LOC DEV 1ST LESION IMG BX SPEC US  GUIDE 01/14/2023 [facility]   BREAST BIOPSY  02/10/2023   MM LT RADIOACTIVE SEED LOC MAMMO GUIDE 02/10/2023 [facility]   BREAST LUMPECTOMY WITH RADIOACTIVE SEED AND SENTINEL LYMPH NODE BIOPSY Left 02/14/2023   Procedure: LEFT BREAST LUMPECTOMY WITH RADIOACTIVE SEED AND AXILLARY SENTINEL LYMPH NODE BIOPSY;  Surgeon: Ebbie Cough, MD;  Location: [facility];  Service: General;  Laterality: Left;   HERNIA REPAIR  03/2011   PORT-A-CATH REMOVAL N/A 05/31/2023   Procedure: REMOVAL PORT-A-CATH;  Surgeon: Ebbie Cough, MD;  Location: [facility];  Service: General;  Laterality: N/A;   PORTACATH PLACEMENT Right 03/15/2023   Procedure: INSERTION PORT-A-CATH;  Surgeon: Ebbie Cough, MD;  Location: [facility];  Service: General;  Laterality: Right;    UMBILICAL HERNIA REPAIR  05/13/2011   Patient Active Problem List   Diagnosis Date Noted   Pain of maxillary sinus 07/12/2024   B12 deficiency 11/29/2023   Port-A-Cath in place 03/23/2023   Genetic testing 02/04/2023   Malignant neoplasm of upper-outer quadrant of left breast in female, estrogen receptor positive (HCC) 01/24/2023   Low vitamin D  level 11/19/2021   Incontinence 12/19/2020   Low mean corpuscular volume (MCV) 11/23/2020   Pain and swelling of wrist, left 03/14/2020   Hyperglycemia 11/09/2019   Uterine leiomyoma 11/09/2019   Acute upper respiratory infection 02/12/2019   Increased body mass index 11/02/2018   Obesity 08/02/2014   Heart murmur, systolic 08/01/2014   Encounter for well adult exam with abnormal findings 03/01/2011   Hyperlipidemia 11/20/2009   Iron deficiency anemia 11/20/2009   Allergic rhinitis 11/06/2009   Asthma 11/06/2009    PCP: Norleen Lynwood ORN., MD  REFERRING PROVIDER: Odean Potts, MD   REFERRING DIAG:  [phone] (ICD-10-CM) - Malignant neoplasm of upper-outer quadrant of left breast in female, estrogen receptor positive (HCC)    THERAPY DIAG:  Malignant neoplasm of upper-outer quadrant of left breast in female, estrogen receptor positive (HCC)  Abnormal posture  At risk for lymphedema  Aftercare following surgery for neoplasm  Lymphedema, not elsewhere classified  ONSET DATE: 08/13/24  Rationale for Evaluation and Treatment: Rehabilitation  SUBJECTIVE:  SUBJECTIVE STATEMENT:  Patient reports that she tried doing some upper body exercises, including wall push-ups and bicep curls with 2 lb weights. Following that, she noticed that the swelling above the elbow is back. She wore the sleeve during her workout but took it off immediately after for  work where she uses her left hand frequently. Patient feels like overall she has made progress and is ready to be discharged at this time.   Eval: Patient reports she has noticed some swelling near the elbow and tightness in the forearm. The swelling has improved since the beginning of the week when it began but is still noticeable. The patient is left handed so she does use that arm for most ADL's. She has been doing strength training twice a week and increased her weight on the Monday which could have contributed to the swelling. The patient also attended homecoming for [employer] a few weeks ago where she did a lot of walking.   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 It measures 1.1 cm and is located in the upper outer quadrant. It is ER/PR positive HER2 negative with a Ki67 of 70%. left lumpectomy and SLNB on 02/14/23 with completion of chemotherapy with TC and radiation. 8 negative nodes removed. 1 aspiration in the axilla of 75ml   PAIN:  Are you having pain? No - feels tightness in the forearm and elbow  PRECAUTIONS: None  RED FLAGS: None   WEIGHT BEARING RESTRICTIONS: No  FALLS:  Has patient fallen in last 6 months? 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  COGNITION: Overall cognitive status: Within functional limits for tasks assessed   PALPATION:Cording at the axilla  OBSERVATIONS / OTHER ASSESSMENTS: Increased edema observed at the Lt elbow and slightly above  UPPER EXTREMITY AROM/PROM:  A/PROM RIGHT   eval   Shoulder extension WFL  Shoulder flexion [facility]  Shoulder abduction [facility]  Shoulder internal rotation [facility]  Shoulder external rotation WFL  - tightness in elbow    (Blank rows = not tested)  A/PROM LEFT   eval  Shoulder extension WFL  Shoulder flexion WFL  Shoulder abduction [facility]  Shoulder internal rotation [facility]  Shoulder external rotation WFL - tightness in elbow    (Blank rows = not tested)   UPPER EXTREMITY STRENGTH: WFL   LYMPHEDEMA ASSESSMENTS:   LANDMARK RIGHT  eval  At axilla    15 cm proximal to the proximal aspect of the olecranon process   10 cm proximal to the proximal aspect of the olecranon process   Olecranon process   15 cm proximal to the proximal aspect of the ulnar styloid process   10 cm proximal to the proximal aspect of the ulnar styloid process   Just distal to the ulnar styloid process   Across hand at thumb web space   At base of 2nd digit   (Blank rows = not tested)  LANDMARK LEFT  eval   At axilla  41.3   15 cm proximal to the proximal aspect of the olecranon process    10 cm proximal to the proximal aspect of the olecranon process    Olecranon process 27.7   15 cm proximal to the proximal  aspect of the ulnar styloid process    10 cm proximal to  the proximal aspect of the ulnar styloid process    Just distal to the ulnar styloid process 15.8   Across hand at thumb web space    At base of 2nd digit    (Blank rows = not tested)  Chest circumference just inferior to the axillae:  Chest circumference at the largest point:    L-DEX LYMPHEDEMA SCREENING: The patient was assessed using the L-Dex machine today to produce a lymphedema index baseline score. The patient will be reassessed on a regular basis (typically every 3 months) to obtain new L-Dex scores. If the score is > 6.5 points away from his/her baseline score indicating onset of subclinical lymphedema, it will be recommended to wear a compression garment for 4 weeks, 12 hours per day and then be reassessed. If the score continues to be > 6.5 points from baseline at reassessment, we will initiate lymphedema treatment. Assessing in  this manner has a 95% rate of preventing clinically significant lymphedema.                                                                                                                          TREATMENT DATE:  09/12/24: Therapeutic Exercises Roll yellow ball up wall for flex and Lt UE abd x 10 each Modified downward dog on wall x 5 reps, 5 sec holds Standing pec stretch at doorframe 3 x 20 seconds Manual Therapy With pt permission for arm MLD In supine: Short neck, 5 diaphragmatic breaths, R axillary nodes and establishment of interaxillary pathway, L inguinal nodes and establishment of axilloinguinal pathway, then L UE working proximal to distal, moving fluid from upper inner arm outwards, and doing both sides of forearm moving fluid towards pathways spending extra time in any areas of fibrosis then retracing all steps  09/07/24: Therapeutic Exercises Roll yellow ball up wall for flex and Lt UE abd x 10 each Modified downward dog on wall x 5 reps, 5 sec holds Standing pec stretch at doorframe 3 x 20 seconds Manual Therapy With pt permission for arm MLD STM for cording at the antecubital fossa and axilla, then into Rt S/L for focus to lateral breast/trunk where palpable tightness and lymphedema present. Educated pt tightness and fluid here and that it would be beneficial for pt to perform her self MLD in S/L some to be able to focus on lateral area while performing and had pt return brief demo. This did soften well by end of session.  In supine: Short neck, 5 diaphragmatic breaths, R axillary nodes and establishment of interaxillary pathway, L inguinal nodes and establishment of axilloinguinal pathway, then L UE working proximal to distal, moving fluid from upper inner arm outwards, and doing both sides of forearm moving fluid towards pathways spending extra time in any areas of fibrosis then retracing all steps  08/31/24: Therapeutic Exercises Roll yellow ball up wall for flex and Lt UE  abd x 10 each, pt returning therapist demo Modified downward dog on wall x 5 reps, 5 sec holds returning therapist demo Standing pec stretch with wrist ext at doorframe 3 x 20 seconds Manual Therapy With pt permission for arm MLD STM for cording at the antecubital fossa and axilla, then into Rt S/L for focus to lateral breast/trunk where palpable tightness and lymphedema present. Educated pt tightness and fluid here and that it would be beneficial for pt to perform her self MLD in S/L some to be able to focus on lateral area while performing and had pt return brief demo. This did soften well by end of session.  In supine: Short neck, 5 diaphragmatic breaths, R axillary nodes and establishment of interaxillary pathway, L inguinal nodes and establishment of axilloinguinal pathway, then L UE working proximal to distal, moving fluid from upper inner arm outwards, and doing both sides of forearm moving fluid towards pathways spending extra time in any areas of fibrosis then retracing all steps   08/24/24 With pt permission for arm MLD Therapeutic Exercise: Standing pec stretch at wall 3 x 30 seconds Standing wrist extension stretch 3 x 30 seconds Manual Therapy: STM for cording at the antecubital fossa, forearm, and axilla  In supine: Short neck, 5 diaphragmatic breaths, R axillary nodes and establishment of interaxillary pathway, L inguinal nodes and establishment of axilloinguinal pathway, then L UE working proximal to distal, moving fluid from upper inner arm outwards, and doing both sides of forearm moving fluid towards pathways spending extra time in any areas of fibrosis then retracing all steps      08/16/24 Completed initial evaluation Pt education on ordering a compression sleeve, wearing it when she is weight training or will be increasing her activity Demonstrated and explained how to perform MLD for the Lt arm STM for cording at the antecubital fossa and forearm Doorway stretch 2 x 30  seconds  Standing pec stretch at wall x 30 seconds Standing wrist extension stretch x 30 seconds  PATIENT EDUCATION:  Education details: POC, compression sleeve, MLD for breast and Lt arm, HEP Person educated: Patient Education method: Explanation, Demonstration, and Handouts Education comprehension: verbalized understanding and returned demonstration  HOME EXERCISE PROGRAM: Access Code: [email or web address] URL: [email or web address] Date: 08/16/2024 Prepared by: Saddie Raw  Exercises - Single Arm Doorway Pec Stretch at 90 Degrees Abduction - 2 x daily - 7 x weekly - 3 reps - 20-30 second hold - Standing Pec Stretch at Wall - 2 x daily - 7 x weekly - 3 reps - 20-30 seconds hold - Standing Wrist Extension Stretch - 2 x daily - 7 x weekly - 3 reps - 20-30 seconds hold   ASSESSMENT:  CLINICAL IMPRESSION: Patient tolerates the continuation of MLD for edema in the antecubital fossa. Cording has significantly improved and can no longer be palpated. The patient completed an UE workout on Monday with 2lb weights and standing wall push-ups; following this the patient noticed increased swelling. Patient is advised to continue wearing her sleeve for at least an hour or two after exercising in order to avoid swelling at the antecubital fossa.  The patient has met her goals for physical therapy and would like to be discharged at this point. She will continue regular SOZO screenings with her next screening being on 09/17/24.    OBJECTIVE IMPAIRMENTS: decreased knowledge of condition, increased edema, and increased fascial restrictions.   ACTIVITY LIMITATIONS: exercise including strength training  PARTICIPATION LIMITATIONS: community activity  PERSONAL FACTORS: 1 comorbidity:  radiation history, SNLB are also affecting patient's functional outcome.   REHAB POTENTIAL: Excellent  CLINICAL DECISION MAKING: Stable/uncomplicated  EVALUATION COMPLEXITY: Low  GOALS: Goals reviewed with  patient? Yes  GOALS: Short term = long term goals Target date: 09/06/24  Patient will obtain a compression sleeve to reduce the risk of lymphedema when increasing activity or exercising  Baseline: Goal status: Met  2.  Patient will be independent with Lt arm MLD to reduce swelling at the Lt elbow Baseline:  Goal status: Met  3.  Patient will reduce cording in the Lt elbow by 50% in order to reduce the tightness and pulling felt with functional movements  Baseline:  Goal status: Met  PLAN:  PT FREQUENCY: 1x/week  PT DURATION: 2 weeks  PLANNED INTERVENTIONS: 97164- PT Re-evaluation, 97110-Therapeutic exercises, 97530- Therapeutic activity, 97140- Manual therapy, Patient/Family education, Balance training, Joint mobilization, Manual lymph drainage, Scar mobilization, Compression bandaging, DME instructions, Therapeutic exercises, Therapeutic activity, Neuromuscular re-education, Gait training, and Self Care   Randall Pack, SPT  09/12/24 11:00 AM  I agree with the following treatment note after reviewing documentation. This session was performed under the supervision of a licensed clinician.  Saddie Raw, PT 09/12/24, 11:00 AM

## 2024-09-17 ENCOUNTER — Ambulatory Visit

## 2024-09-17 VITALS — Wt 216.0 lb

## 2024-09-17 DIAGNOSIS — Z483 Aftercare following surgery for neoplasm: Secondary | ICD-10-CM

## 2024-09-17 NOTE — Therapy (Signed)
 OUTPATIENT PHYSICAL THERAPY SOZO SCREENING NOTE   Patient Name: Sue Beasley MRN: [id number] DOB:10/06/1971, 53 y.o., female Today's Date: 09/17/2024  PCP: Norleen Lynwood ORN, MD REFERRING PROVIDER: Odean Potts, MD   PT End of Session - 09/17/24 1648     Visit Number 5   # unchanged due to screen only   PT Start Time 1646    PT Stop Time 1650    PT Time Calculation (min) 4 min    Activity Tolerance Patient tolerated treatment well    Behavior During Therapy [facility] for tasks assessed/performed          Past Medical History:  Diagnosis Date   ALLERGIC RHINITIS 11/06/2009   Allergy    Anemia    ANEMIA-IRON DEFICIENCY 11/20/2009   Asthma    Cancer (HCC) 12/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 Lynwood Nasuti   Irreducible umbilical hernia 03/01/2011   Personal history of radiation therapy    Wears glasses    Past Surgical History:  Procedure Laterality Date   BREAST BIOPSY Left 01/14/2023   US  LT BREAST BX W LOC DEV 1ST LESION IMG BX SPEC US  GUIDE 01/14/2023 [facility]   BREAST BIOPSY  02/10/2023   MM LT RADIOACTIVE SEED LOC MAMMO GUIDE 02/10/2023 [facility]   BREAST LUMPECTOMY WITH RADIOACTIVE SEED AND SENTINEL LYMPH NODE BIOPSY Left 02/14/2023   Procedure: LEFT BREAST LUMPECTOMY WITH RADIOACTIVE SEED AND AXILLARY SENTINEL LYMPH NODE BIOPSY;  Surgeon: Ebbie Cough, MD;  Location: [facility];  Service: General;  Laterality: Left;   HERNIA REPAIR  03/2011   PORT-A-CATH REMOVAL N/A 05/31/2023   Procedure: REMOVAL PORT-A-CATH;  Surgeon: Ebbie Cough, MD;  Location: [facility];  Service: General;  Laterality: N/A;   PORTACATH PLACEMENT Right 03/15/2023   Procedure: INSERTION PORT-A-CATH;  Surgeon: Ebbie Cough, MD;  Location: [facility];  Service: General;  Laterality: Right;   UMBILICAL HERNIA REPAIR  05/13/2011    Patient Active Problem List   Diagnosis Date Noted   Pain of maxillary sinus 07/12/2024   B12 deficiency 11/29/2023   Port-A-Cath in place 03/23/2023   Genetic testing 02/04/2023   Malignant neoplasm of upper-outer quadrant of left breast in female, estrogen receptor positive (HCC) 01/24/2023   Low vitamin D  level 11/19/2021   Incontinence 12/19/2020   Low mean corpuscular volume (MCV) 11/23/2020   Pain and swelling of wrist, left 03/14/2020   Hyperglycemia 11/09/2019   Uterine leiomyoma 11/09/2019   Acute upper respiratory infection 02/12/2019   Increased body mass index 11/02/2018   Obesity 08/02/2014   Heart murmur, systolic 08/01/2014   Encounter for well adult exam with abnormal findings 03/01/2011   Hyperlipidemia 11/20/2009   Iron deficiency anemia 11/20/2009   Allergic rhinitis 11/06/2009   Asthma 11/06/2009    REFERRING DIAG: left breast cancer at risk for lymphedema  THERAPY DIAG: Aftercare following surgery for neoplasm  PERTINENT HISTORY:  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 It measures 1.1 cm and is located in the upper outer quadrant. It is ER/PR positive HER2 negative with a Ki67 of 70%. left lumpectomy and SLNB on 02/14/23 with completion of chemotherapy with TC and radiation. 8 negative nodes removed. 1 aspiration in the axilla of 75ml   PRECAUTIONS: left UE Lymphedema risk, None  SUBJECTIVE: Pt returns for her 3 month L-Dex screen.  PAIN:  Are you having pain? No  SOZO SCREENING: Patient was assessed today using the SOZO machine to determine the lymphedema index score. This was compared to her baseline score. It was determined that she is within the recommended range when compared to her baseline and no further action is needed at this time. She will continue SOZO screenings. These are done every 3 months for 2 years post operatively followed by every 6 months for 2 years, and then annually.   L-DEX FLOWSHEETS - 09/17/24 1600        L-DEX LYMPHEDEMA SCREENING   Measurement Type Unilateral    L-DEX MEASUREMENT EXTREMITY Upper Extremity    POSITION  Standing    DOMINANT SIDE Left    At Risk Side Left    BASELINE SCORE (UNILATERAL) -1.6    L-DEX SCORE (UNILATERAL) -6.3    VALUE CHANGE (UNILAT) -4.7         P: Cont every 3 month L-Dex screens until 01/2025, then transition to every 6 months until 01/2027.    Aden Berwyn Caldron, PTA 09/17/2024, 4:50 PM

## 2024-09-28 ENCOUNTER — Other Ambulatory Visit: Payer: Self-pay | Admitting: *Deleted

## 2024-09-28 DIAGNOSIS — G4489 Other headache syndrome: Secondary | ICD-10-CM

## 2024-09-28 DIAGNOSIS — C50412 Malignant neoplasm of upper-outer quadrant of left female breast: Secondary | ICD-10-CM

## 2024-09-28 NOTE — Progress Notes (Signed)
 Received call from pt with complaint of ongoing left sided headache and head pressure.  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  Pt states she recently started a new and stressful job but is concerned with symptoms and if symptoms could be related to Tamoxifen .  Per MD pt needing brain MRI first to r/o any metastatic disease.  If MRI negative, pt to hold Tamoxifen  x2 weeks to see if symptoms resolve and f/u in office.  Orders placed, pt notified that scan will be scheduled once PA is obtained.  Pt verbalized understanding.

## 2024-10-03 ENCOUNTER — Telehealth: Payer: Self-pay

## 2024-10-03 NOTE — Telephone Encounter (Signed)
 Received call from pt w/ concern over whether she could have her MRI tomorrow w/ her oral metal retainer in place. RN consulted MRI and was informed she could proceed with the MRI. RN educated pt that she was okay to proceed and to remind MRI about her retainer before scan. Pt verbalized understanding.

## 2024-10-04 ENCOUNTER — Telehealth: Payer: Self-pay | Admitting: *Deleted

## 2024-10-04 ENCOUNTER — Telehealth: Payer: Self-pay | Admitting: Hematology and Oncology

## 2024-10-04 ENCOUNTER — Ambulatory Visit (HOSPITAL_COMMUNITY): Admission: RE | Admit: 2024-10-04 | Discharge: 2024-10-04 | Attending: Hematology and Oncology

## 2024-10-04 DIAGNOSIS — C50412 Malignant neoplasm of upper-outer quadrant of left female breast: Secondary | ICD-10-CM | POA: Diagnosis present

## 2024-10-04 DIAGNOSIS — G4489 Other headache syndrome: Secondary | ICD-10-CM | POA: Insufficient documentation

## 2024-10-04 DIAGNOSIS — Z170 Estrogen receptor positive status [ER+]: Secondary | ICD-10-CM | POA: Diagnosis present

## 2024-10-04 MED ORDER — GADOBUTROL 1 MMOL/ML IV SOLN
10.0000 mL | Freq: Once | INTRAVENOUS | Status: AC | PRN
  Administered 2024-10-04: 10 mL via INTRAVENOUS

## 2024-10-04 NOTE — Telephone Encounter (Signed)
 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 I encouraged her to take aspirin on a regular basis 81 mg daily.

## 2024-10-04 NOTE — Telephone Encounter (Signed)
 Per MD request, RN placed call to pt with Brain MRI results being negative for metastatic disease. Per MD pt needing to hold tamoxifen  x 2 weeks to see if headaches and trouble finding words improves.  Pt educated to contact our office in 2 weeks with an update, pt verbalized understanding.

## 2024-10-09 ENCOUNTER — Telehealth: Payer: Self-pay

## 2024-10-09 NOTE — Telephone Encounter (Signed)
 Copied from CRM #[id number]. Topic: Appointments - Transfer of Care >> Oct 09, 2024  9:43 AM Mesmerise C wrote: Pt is requesting to transfer FROM: Dr.John Pt is requesting to transfer TO: Np Corean Ku Reason for requested transfer: Dr. Norleen is retiring It is the responsibility of the team the patient would like to transfer to (Dr. Ku) to reach out to the patient if for any reason this transfer is not acceptable.

## 2024-10-09 NOTE — Telephone Encounter (Signed)
Ok with me. thanks 

## 2024-11-02 ENCOUNTER — Encounter (INDEPENDENT_AMBULATORY_CARE_PROVIDER_SITE_OTHER): Payer: Self-pay

## 2024-11-02 ENCOUNTER — Ambulatory Visit (INDEPENDENT_AMBULATORY_CARE_PROVIDER_SITE_OTHER)

## 2024-11-02 ENCOUNTER — Ambulatory Visit (INDEPENDENT_AMBULATORY_CARE_PROVIDER_SITE_OTHER): Admitting: Audiology

## 2024-11-02 VITALS — BP 123/81 | HR 52 | Wt 216.0 lb

## 2024-11-02 DIAGNOSIS — J342 Deviated nasal septum: Secondary | ICD-10-CM | POA: Diagnosis not present

## 2024-11-02 DIAGNOSIS — J343 Hypertrophy of nasal turbinates: Secondary | ICD-10-CM

## 2024-11-02 DIAGNOSIS — M26629 Arthralgia of temporomandibular joint, unspecified side: Secondary | ICD-10-CM

## 2024-11-02 DIAGNOSIS — J339 Nasal polyp, unspecified: Secondary | ICD-10-CM | POA: Diagnosis not present

## 2024-11-02 DIAGNOSIS — H93299 Other abnormal auditory perceptions, unspecified ear: Secondary | ICD-10-CM

## 2024-11-02 DIAGNOSIS — Z0110 Encounter for examination of ears and hearing without abnormal findings: Secondary | ICD-10-CM | POA: Diagnosis not present

## 2024-11-02 DIAGNOSIS — J320 Chronic maxillary sinusitis: Secondary | ICD-10-CM | POA: Diagnosis not present

## 2024-11-02 NOTE — Progress Notes (Unsigned)
 Dear Dr. Norleen, Here is my assessment for our mutual patient, Sue Beasley. Thank you for allowing me the opportunity to care for your patient. Please do not hesitate to contact me should you have any other questions. Sincerely, Dr. Penne Croak  Otolaryngology Clinic Note Referring provider: Dr. Norleen HPI:  Discussed the use of AI scribe software for clinical note transcription with the patient, who gave verbal consent to proceed.  History of Present Illness Sue Beasley is a 54 year old female who presents with intermittent nasal pressure. She was referred by Dr. Norleen for evaluation of nasal pressure.  Nasal pressure - Intermittent pressure localized to the nasal area - Occurs approximately three to four times per week - Each episode lasts about one minute - No associated pain - No identifiable triggers or alleviating factors - Symptoms occur randomly  Allergic rhinitis - History of allergies - Currently receiving allergy immunotherapy (allergy shots) - Recent change in allergy serum following retesting in December - Uses a nasal spray for allergy management  Oncologic history and treatment - History of cancer, currently taking tamoxifen  - Completed radiation therapy in November of the previous year - Concern regarding possible relationship between nasal symptoms and cancer treatment or medication  Trauma history - History of prior fall with impact to the jaw  Psychological stress - Experiences job-related stress - Currently attending counseling for stress management  Hearing status - No recent hearing test - Curious about current hearing status   Sue Beasley is a 54 year old female with chronic allergic rhinitis, deviated nasal septum, and chronic maxillary sinusitis with nasal polyp who presents for follow-up of persistent nasal congestion and review of recent imaging.  Nasal Congestion and Obstruction: - Persistent nasal congestion with intermittent  swelling - Alternating sensations of dryness and wetness throughout the day - Variable nasal obstruction, occasionally requiring mouth breathing - No significant impact on quality of life - No snoring, purulent nasal discharge, facial pain, or pressure - No anosmia; able to perceive odors normally - Right-sided nasal crusting - No acute sinus infection symptoms  Allergic Rhinitis and Medication Use: - Regular use of intranasal corticosteroid (Flonase) and antihistamine (azelastine) sprays with perceived benefit - Not performing sinus irrigations as previously recommended - Symptom fluctuation with stress, improvement during rest, and mild worsening during increased activity  Imaging and Structural Findings: - Recent sinus CT (September 2025) demonstrated deviated septum, mucosal swelling, and polypoid lesion - Unaware of right-sided nasal symptoms despite imaging findings - No symptoms attributable to nasal polyp prior to imaging - No history of specific nasal trauma, but possibility of remote injury acknowledged  Systemic and Neurological Symptoms: - No fever, chills, or other systemic symptoms - No current sleep disturbance - No new neurological deficits  Audiometric and Neurological Testing: - Recent brain MRI was normal - Audiometric testing showed normal bilateral hearing   Independent Review of Additional Tests or Records:  Reviewed external note from referring PCP, John,describing relevant history incorporated into todays evaluation.   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 Lynwood Nasuti   Irreducible umbilical hernia 03/01/2011   Personal history of radiation therapy    Wears glasses      Past Surgical History:  Procedure Laterality Date   BREAST BIOPSY Left 01/14/2023   US  LT BREAST BX W LOC DEV 1ST LESION IMG BX SPEC US  GUIDE 01/14/2023 [facility]   BREAST BIOPSY  02/10/2023   MM LT RADIOACTIVE SEED LOC MAMMO GUIDE 02/10/2023 [facility]   BREAST LUMPECTOMY WITH RADIOACTIVE SEED AND SENTINEL LYMPH NODE BIOPSY Left 02/14/2023   Procedure: LEFT BREAST LUMPECTOMY WITH RADIOACTIVE SEED AND AXILLARY SENTINEL LYMPH NODE BIOPSY;  Surgeon: Ebbie Cough, MD;  Location: [facility];  Service: General;  Laterality: Left;   HERNIA REPAIR  03/2011   PORT-A-CATH REMOVAL N/A 05/31/2023   Procedure: REMOVAL PORT-A-CATH;  Surgeon: Ebbie Cough, MD;  Location: [facility];  Service: General;  Laterality: N/A;   PORTACATH PLACEMENT Right 03/15/2023   Procedure: INSERTION PORT-A-CATH;  Surgeon: Ebbie Cough, MD;  Location: [facility];  Service: General;  Laterality: Right;   UMBILICAL HERNIA REPAIR  05/13/2011    Family History  Problem Relation Age of Onset   Hyperlipidemia Mother    Heart disease Mother    Diabetes Father    Hypertension Father    Heart disease Father    Colon polyps Sister    Miscarriages / Stillbirths Sister    Esophageal cancer Maternal Uncle 89   Pancreatic cancer Cousin 34       mat female cousin   Prostate cancer Cousin 60       mat cousin   Breast cancer Neg Hx    Colon cancer Neg Hx    Stomach cancer Neg Hx    Rectal cancer Neg Hx      Social Connections: Moderately Integrated (07/11/2024)   Social Connection and Isolation Panel    Frequency of Communication with Friends and Family: More than three times a week    Frequency of Social Gatherings with Friends and Family: More than three times a week    Attends Religious  Services: More than 4 times per year    Active Member of [employer] or Organizations: Yes    Attends Engineer, Structural: More than 4 times per year    Marital Status: Never married      Current Outpatient Medications:    albuterol (VENTOLIN HFA) 108 (90 Base) MCG/ACT inhaler, Inhale 2 puffs into the lungs every 6 (six) hours as needed for shortness of breath or wheezing., Disp: , Rfl:    azelastine (ASTELIN) 0.1 % nasal spray, Place 1 spray into both nostrils in the morning., Disp: , Rfl: 5   desoximetasone (TOPICORT) 0.25 % cream, Apply 1 Application topically 2 (two) times daily as needed (eczema)., Disp: , Rfl:    diphenhydrAMINE (BENADRYL) 25 MG tablet, Take 25 mg by mouth daily as needed (food allergies (with restaurant eating))., Disp: , Rfl:    dorzolamide-timolol (COSOPT) 22.3-6.8 MG/ML ophthalmic solution, Place 1 drop into both eyes 2 (two) times daily., Disp: , Rfl:    EPINEPHrine  0.3 mg/0.3 mL IJ SOAJ injection, Inject 0.3 mg into the muscle as needed for anaphylaxis., Disp: , Rfl:    ferrous sulfate 325 (65 FE) MG tablet, Take 325 mg by mouth 3 (three) times a week., Disp: , Rfl:    fexofenadine (ALLEGRA ODT) 30 MG disintegrating tablet, Take 30 mg by mouth daily., Disp: , Rfl:  fluticasone (FLONASE) 50 MCG/ACT nasal spray, Place 2 sprays into both nostrils in the morning., Disp: , Rfl: 4   fluticasone-salmeterol (WIXELA INHUB) 250-50 MCG/ACT AEPB, Inhale 1 puff into the lungs in the morning., Disp: , Rfl:    ketotifen (ZADITOR) 0.035 % ophthalmic solution, Place 1-2 drops into both eyes 2 (two) times daily as needed (allergies.)., Disp: , Rfl:    latanoprost (XALATAN) 0.005 % ophthalmic solution, Place 1 drop into both eyes at bedtime., Disp: , Rfl:    methylPREDNISolone  (MEDROL  DOSEPAK) 4 MG TBPK tablet, Take 4 tab by mouth x 3 days, 2 tabs x 3 days, then 1 tab x 3 days, Disp: 21 tablet, Rfl: 0   tamoxifen  (NOLVADEX ) 20 MG tablet, Take 1 tablet (20 mg total) by mouth  daily., Disp: 90 tablet, Rfl: 3   Physical Exam:   BP 123/81 (BP Location: Left Arm, Patient Position: Sitting, Cuff Size: Normal)   Pulse (!) 52   Wt 216 lb (98 kg)   SpO2 96%   BMI 40.81 kg/m   The patient was awake, alert, and appropriate. The external ears were inspected, and otoscopy was performed to evaluate the external auditory canals and tympanic membranes. The nasal cavity and septum were examined for mucosal changes, obstruction, or discharge. The oral cavity and oropharynx were inspected for mucosal lesions, infection, or tonsillar hypertrophy. The neck was palpated for lymphadenopathy, thyroid  abnormalities, or other masses. Cranial nerve function was grossly intact.  Pertinent Findings: Physical Exam HEENT: Eardrums normal. Deviated septum present. Nasal drainage present. Mild chronic sinusitis. Normal oropharynx. Moist mucous membranes.   HEENT: Atraumatic, normocephalic. External ears with excellent hearing. Nasal septum deviated with swollen mucosa and crusting on the right side. Throat normal. Inferior turbinate hypertrophy  Tenderness to palpation left TMJ  Seprately Identifiable Procedures:  I personally ordered, reviewed and interpreted the following with the patient today  Given the patient's symptoms and incomplete visualization of critical sinonasal areas with anterior rhinoscopy, a separately performed diagnostic nasal endoscopy procedure is indicated for a complete rhinologic evaluation per American Rhinologic Society recommendations (https://www.american-rhinologic.org/position-statements)  I personally ordered, reviewed and interpreted the following with the patient today  Procedure Note Diagnostic Nasal Endoscopy CPT CODE -- 68768 - Mod 25  Prior to initiating any procedures, risks/benefits/alternatives were explained to the patient and verbal consent obtained.  Pre-procedure diagnosis: Concern for mass/obstruction Post-procedure diagnosis:  same Indication: See pre-procedure diagnosis and physical exam above Complications: None apparent EBL: 0 mL Anesthesia: Lidocaine  4% and topical decongestant was topically sprayed in each nasal cavity  Description of Procedure:  Patient was identified. A flexible fiberoptic endoscope was utilized to evaluate the sinonasal cavities, mucosa, sinus ostia and turbinates and septum.  Overall, signs of mucosal inflammation are noted.  Also noted are crusting anterior aspect of bilateral inferior turbinates.  No mucopurulence, polyps, or masses noted.   Right Middle meatus: congested Right SE Recess: clear Left MM: congested Left SE Recess: clear Photodocumentation was obtained.   Impression & Plans:  Sue Beasley is a 54 y.o. female  No diagnosis found.   - Findings and diagnoses discussed in detail with the patient. - Risks, benefits, and alternatives were reviewed. Through shared decision making, the patient elects to proceed with below.  Assessment & Plan Temporomandibular joint (TMJ) disorder Intermittent jaw pressure likely due to stress and possible bruxism. CT scan normal. Differential includes stress-related muscle tension and bruxism. - Recommended over-the-counter bite guard for nighttime use. - Advised to avoid chewing gum and opt for  softer foods. - Suggested ibuprofen for symptomatic relief during episodes. - Continue counseling for stress management.  Chronic sinusitis and allergic rhinitis Mild chronic sinusitis likely allergy-triggered. CT shows mild sinus issues, primarily right-sided. Nasal endoscopy shows deviated septum without significant obstruction. Current nasal spray effective. - Continue current allergy management with nasal spray. - Scheduled a hearing test to assess auditory function.  Deviated nasal septum Noted on CT and endoscopy. No significant obstruction or breathing difficulties. Current allergy management maintains nasal patency. - Continue current  allergy management with nasal spray.   Chronic maxillary sinusitis with nasal polyp Chronic maxillary sinusitis with right maxillary sinus polyp previously identified. Currently asymptomatic with no acute infection or symptoms attributable to the polyp. Possible spontaneous resolution. Ongoing surveillance necessary to detect recurrence or progression. - Monitor for symptoms including nasal obstruction, anosmia, or increased congestion. - Reassess in 4-6 months for recurrence or polyp growth. - Repeat CT scan later in the year to evaluate sinus and polyp status.  Allergic rhinitis with nasal turbinate hypertrophy and concha bullosa Allergic rhinitis with turbinate hypertrophy and bilateral concha bullosa causing intermittent congestion and pressure. Current treatment provides relief. No significant impact on quality of life. - Continue intranasal corticosteroid and antihistamine sprays. - Initiate regular sinus rinses to reduce crusting and congestion. - Monitor for worsening symptoms, including increased nasal obstruction or anosmia. - Follow up in 4-6 months for reassessment.  Deviated nasal septum Deviated nasal septum contributing to airflow disturbance but asymptomatic and not affecting quality of life. No surgical intervention indicated. - Continue observation as she remains asymptomatic and does not desire surgical intervention. - Educated regarding symptoms warranting further evaluation, such as significant nasal obstruction or inability to breathe through the nose. - Reassess at next follow-up visit.  - Orders placed: No orders of the defined types were placed in this encounter.  - Medications prescribed/continued/adjusted: No orders of the defined types were placed in this encounter.  - Education materials provided to the patient. - Follow up: with audio, recheck sinus sxs and TMJD. Patient instructed to return sooner or go to the ED if new/worsening symptoms develop.   Thank  you for allowing me the opportunity to care for your patient. Please do not hesitate to contact me should you have any other questions.  Sincerely, Penne Croak, DO Otolaryngologist (ENT) [facility] Phone: [phone] Fax: [phone]  11/02/2024, 1:47 PM

## 2024-11-02 NOTE — Patient Instructions (Signed)
" °  VISIT SUMMARY: During your visit, we discussed your persistent nasal congestion and reviewed your recent imaging results. You have chronic allergic rhinitis, a deviated nasal septum, and chronic maxillary sinusitis with a nasal polyp. We went over your symptoms, current treatments, and future monitoring plans.  YOUR PLAN: -CHRONIC MAXILLARY SINUSITIS WITH NASAL POLYP: Chronic maxillary sinusitis is a long-term inflammation of the sinuses, and a nasal polyp is a noncancerous growth in the nasal passage. Currently, you have no symptoms from the polyp, and there is no acute infection. We will monitor your symptoms and reassess in 4-6 months. A repeat CT scan will be done later in the year to check the status of your sinuses and polyp.  -ALLERGIC RHINITIS WITH NASAL TURBINATE HYPERTROPHY AND CONCHA BULLOSA: Allergic rhinitis is an allergic reaction causing nasal congestion, and turbinate hypertrophy and concha bullosa are conditions that can further block nasal airflow. Your current treatment with intranasal corticosteroid and antihistamine sprays is providing relief. We recommend starting regular sinus rinses to reduce crusting and congestion. We will monitor your symptoms and reassess in 4-6 months.  -DEVIATED NASAL SEPTUM: A deviated nasal septum is a condition where the nasal septum is displaced to one side, which can cause airflow disturbance. Currently, it is not affecting your quality of life, and no surgical intervention is needed. We will continue to observe and reassess at your next follow-up visit.  INSTRUCTIONS: Please continue using your intranasal corticosteroid and antihistamine sprays as prescribed. Start regular sinus rinses to help with crusting and congestion. Monitor for any worsening symptoms, such as increased nasal obstruction or loss of smell. We will reassess your condition in 4-6 months and perform a repeat CT scan later in the year to evaluate your sinuses and nasal  polyp.                      Contains text generated by [employer]bridge.                                 Contains text generated by Abridge.   "

## 2024-11-02 NOTE — Progress Notes (Signed)
" °  [address] [place], [place] [address] [phone]  Audiological Evaluation    Name: Sue Beasley     DOB:   09-04-1971      MRN:   [id number]                                                                                     Service Date: 11/02/2024     Accompanied by: self    Patient comes today after Dr. Anice, ENT sent a referral for a hearing evaluation due to concerns with ear fullness.   Symptoms Yes Details  Hearing loss  []    Tinnitus  [x]  Both ears  Ear pain/ infections/pressure  [x]  Ear pressure/muffled hearing when she has allergies- not today  Balance problems  []    Noise exposure history  []    Previous ear surgeries  []    Family history of hearing loss  []    Amplification  []    Other  []      Otoscopy: Right ear: Clear external ear canal and notable landmarks visualized on the tympanic membrane. Left ear:  Clear external ear canal and notable landmarks visualized on the tympanic membrane.  Tympanometry: Right ear: Type A - Normal external ear canal volume with normal middle ear pressure and normal tympanic membrane compliance. Findings are consistent with normal middle ear function. Left ear: Type A - Normal external ear canal volume with normal middle ear pressure and normal tympanic membrane compliance. Findings are consistent with normal middle ear function.  Hearing Evaluation The hearing test results were completed under headphones and results are deemed to be of good reliability. Test technique:  conventional    Pure tone Audiometry: Both ears- Normal hearing from [phone].   Speech Audiometry: Right ear- Speech Reception Threshold (SRT) was obtained at 10 dBHL. Left ear-Speech Reception Threshold (SRT) was obtained at 10 dBHL.   Word Recognition Score Tested using NU-6 (recorded) Right ear: 96% was obtained at a presentation level of 60 dBHL with contralateral masking which is deemed as  excellent. Left ear: 96% was obtained  at a presentation level of 60 dBHL with contralateral masking which is deemed as  excellent.   Impression: There is not a significant difference in pure-tone thresholds between ears. There is not a significant difference in the word recognition score in between ears.    Recommendations: Follow up with ENT as scheduled. Return for a hearing evaluation if concerns with hearing changes arise or per MD recommendation. Consider various tinnitus strategies, including the use of a sound generator, hearing aids, and/or tinnitus retraining therapy.    Natale Thoma MARIE LEROUX-MARTINEZ, AUD  "

## 2024-11-05 ENCOUNTER — Telehealth: Payer: Self-pay

## 2024-11-05 NOTE — Telephone Encounter (Signed)
 Pt called and LVM stating she was supposed to call for f/u and forgot to. She calls and states that she was still having head aches and trouble forming words, but states even after stopping tamoxifen  she was having these sx. She states that she took a break from work and was getting more sleep and it has resolved. She has restarted tamoxifen  and has no complaints at this time. She is scheduled for f/u in July but knows to call with any changes or concerns in the interim.

## 2024-11-30 ENCOUNTER — Other Ambulatory Visit: Payer: Self-pay | Admitting: Adult Health

## 2024-11-30 ENCOUNTER — Encounter: Payer: 59 | Admitting: Internal Medicine

## 2024-11-30 DIAGNOSIS — Z9889 Other specified postprocedural states: Secondary | ICD-10-CM

## 2024-12-17 ENCOUNTER — Ambulatory Visit: Attending: Hematology and Oncology

## 2024-12-21 ENCOUNTER — Encounter: Admitting: Family Medicine

## 2024-12-31 ENCOUNTER — Encounter

## 2025-02-12 ENCOUNTER — Encounter: Admitting: Family Medicine

## 2025-03-06 ENCOUNTER — Ambulatory Visit (INDEPENDENT_AMBULATORY_CARE_PROVIDER_SITE_OTHER)

## 2025-05-16 ENCOUNTER — Ambulatory Visit: Admitting: Hematology and Oncology
